# Patient Record
Sex: Female | Born: 1940
Health system: Southern US, Community
[De-identification: ages and names within clinical notes are randomized; demographics above are authoritative.]

## PROBLEM LIST (undated history)

## (undated) DIAGNOSIS — E039 Hypothyroidism, unspecified: Secondary | ICD-10-CM

## (undated) DIAGNOSIS — I4891 Unspecified atrial fibrillation: Secondary | ICD-10-CM

## (undated) DIAGNOSIS — E079 Disorder of thyroid, unspecified: Secondary | ICD-10-CM

## (undated) DIAGNOSIS — M1990 Unspecified osteoarthritis, unspecified site: Secondary | ICD-10-CM

## (undated) DIAGNOSIS — E785 Hyperlipidemia, unspecified: Secondary | ICD-10-CM

## (undated) DIAGNOSIS — I739 Peripheral vascular disease, unspecified: Secondary | ICD-10-CM

## (undated) DIAGNOSIS — H8109 Meniere's disease, unspecified ear: Secondary | ICD-10-CM

## (undated) HISTORY — DX: Peripheral vascular disease, unspecified: I73.9

## (undated) HISTORY — PX: ABDOMINAL HYSTERECTOMY: SHX81

---

## 2006-11-05 ENCOUNTER — Encounter: Admission: RE | Admit: 2006-11-05 | Discharge: 2006-11-05 | Payer: Self-pay | Admitting: General Surgery

## 2011-10-02 ENCOUNTER — Encounter (INDEPENDENT_AMBULATORY_CARE_PROVIDER_SITE_OTHER): Payer: Self-pay | Admitting: General Surgery

## 2013-11-09 DIAGNOSIS — M1990 Unspecified osteoarthritis, unspecified site: Secondary | ICD-10-CM | POA: Insufficient documentation

## 2013-11-09 DIAGNOSIS — E039 Hypothyroidism, unspecified: Secondary | ICD-10-CM | POA: Insufficient documentation

## 2013-11-09 DIAGNOSIS — E785 Hyperlipidemia, unspecified: Secondary | ICD-10-CM | POA: Insufficient documentation

## 2014-02-12 ENCOUNTER — Other Ambulatory Visit: Payer: Self-pay | Admitting: Gastroenterology

## 2014-02-12 DIAGNOSIS — K838 Other specified diseases of biliary tract: Secondary | ICD-10-CM

## 2014-02-19 ENCOUNTER — Ambulatory Visit
Admission: RE | Admit: 2014-02-19 | Discharge: 2014-02-19 | Disposition: A | Discharge status: Home / Self Care | Payer: Medicare Other | Source: Ambulatory Visit | Attending: Gastroenterology | Admitting: Gastroenterology

## 2014-02-19 DIAGNOSIS — K838 Other specified diseases of biliary tract: Secondary | ICD-10-CM

## 2014-02-19 MED ORDER — GADOBENATE DIMEGLUMINE 529 MG/ML IV SOLN
15.0000 mL | Freq: Once | INTRAVENOUS | Status: AC | PRN
  Administered 2014-02-19: 15 mL via INTRAVENOUS

## 2014-02-26 ENCOUNTER — Other Ambulatory Visit (HOSPITAL_COMMUNITY): Payer: Self-pay | Admitting: Gastroenterology

## 2014-02-26 DIAGNOSIS — R112 Nausea with vomiting, unspecified: Secondary | ICD-10-CM

## 2014-03-08 ENCOUNTER — Encounter (HOSPITAL_COMMUNITY)
Admission: RE | Admit: 2014-03-08 | Discharge: 2014-03-08 | Disposition: A | Discharge status: Home / Self Care | Payer: Medicare Other | Source: Ambulatory Visit | Attending: Gastroenterology | Admitting: Gastroenterology

## 2014-03-08 DIAGNOSIS — R112 Nausea with vomiting, unspecified: Secondary | ICD-10-CM | POA: Insufficient documentation

## 2014-03-08 MED ORDER — SINCALIDE 5 MCG IJ SOLR
0.0200 ug/kg | Freq: Once | INTRAMUSCULAR | Status: AC
  Administered 2014-03-08: 1.47 ug via INTRAVENOUS

## 2014-03-08 MED ORDER — TECHNETIUM TC 99M MEBROFENIN IV KIT
5.0000 | PACK | Freq: Once | INTRAVENOUS | Status: AC | PRN
  Administered 2014-03-08: 5 via INTRAVENOUS

## 2014-03-08 MED ORDER — SINCALIDE 5 MCG IJ SOLR
INTRAMUSCULAR | Status: AC
  Administered 2014-03-08: 1.47 ug via INTRAVENOUS
  Filled 2014-03-08: qty 5

## 2014-09-23 HISTORY — PX: SPHINCTEROTOMY: SHX5279

## 2014-10-01 ENCOUNTER — Emergency Department (HOSPITAL_COMMUNITY): Payer: Medicare Other

## 2014-10-01 ENCOUNTER — Inpatient Hospital Stay (HOSPITAL_COMMUNITY)
Admission: EM | Admit: 2014-10-01 | Discharge: 2014-10-05 | DRG: 445 | Disposition: A | Discharge status: Home / Self Care | Payer: Medicare Other | Attending: Internal Medicine | Admitting: Internal Medicine

## 2014-10-01 ENCOUNTER — Telehealth (HOSPITAL_BASED_OUTPATIENT_CLINIC_OR_DEPARTMENT_OTHER): Payer: Self-pay | Admitting: Emergency Medicine

## 2014-10-01 ENCOUNTER — Encounter (HOSPITAL_COMMUNITY): Payer: Self-pay | Admitting: Emergency Medicine

## 2014-10-01 DIAGNOSIS — E039 Hypothyroidism, unspecified: Secondary | ICD-10-CM | POA: Diagnosis present

## 2014-10-01 DIAGNOSIS — K830 Cholangitis: Secondary | ICD-10-CM | POA: Diagnosis present

## 2014-10-01 DIAGNOSIS — K8030 Calculus of bile duct with cholangitis, unspecified, without obstruction: Principal | ICD-10-CM | POA: Diagnosis present

## 2014-10-01 DIAGNOSIS — E785 Hyperlipidemia, unspecified: Secondary | ICD-10-CM | POA: Diagnosis present

## 2014-10-01 DIAGNOSIS — R531 Weakness: Secondary | ICD-10-CM | POA: Diagnosis present

## 2014-10-01 DIAGNOSIS — H8109 Meniere's disease, unspecified ear: Secondary | ICD-10-CM | POA: Diagnosis present

## 2014-10-01 DIAGNOSIS — K8309 Other cholangitis: Secondary | ICD-10-CM

## 2014-10-01 DIAGNOSIS — E876 Hypokalemia: Secondary | ICD-10-CM | POA: Diagnosis present

## 2014-10-01 DIAGNOSIS — K8032 Calculus of bile duct with acute cholangitis without obstruction: Secondary | ICD-10-CM | POA: Diagnosis present

## 2014-10-01 DIAGNOSIS — D649 Anemia, unspecified: Secondary | ICD-10-CM | POA: Diagnosis present

## 2014-10-01 DIAGNOSIS — T501X5A Adverse effect of loop [high-ceiling] diuretics, initial encounter: Secondary | ICD-10-CM | POA: Diagnosis present

## 2014-10-01 DIAGNOSIS — K8020 Calculus of gallbladder without cholecystitis without obstruction: Secondary | ICD-10-CM

## 2014-10-01 DIAGNOSIS — K8050 Calculus of bile duct without cholangitis or cholecystitis without obstruction: Secondary | ICD-10-CM | POA: Diagnosis present

## 2014-10-01 DIAGNOSIS — R710 Precipitous drop in hematocrit: Secondary | ICD-10-CM | POA: Diagnosis present

## 2014-10-01 HISTORY — DX: Disorder of thyroid, unspecified: E07.9

## 2014-10-01 LAB — URINALYSIS, ROUTINE W REFLEX MICROSCOPIC
BILIRUBIN URINE: NEGATIVE
GLUCOSE, UA: NEGATIVE mg/dL
Ketones, ur: NEGATIVE mg/dL
Nitrite: NEGATIVE
Protein, ur: NEGATIVE mg/dL
Specific Gravity, Urine: 1.013 (ref 1.005–1.030)
UROBILINOGEN UA: 2 mg/dL — AB (ref 0.0–1.0)
pH: 6.5 (ref 5.0–8.0)

## 2014-10-01 LAB — CBC WITH DIFFERENTIAL/PLATELET
Basophils Absolute: 0 10*3/uL (ref 0.0–0.1)
Basophils Relative: 0 % (ref 0–1)
EOS ABS: 0 10*3/uL (ref 0.0–0.7)
Eosinophils Relative: 0 % (ref 0–5)
HEMATOCRIT: 39.1 % (ref 36.0–46.0)
Hemoglobin: 13.6 g/dL (ref 12.0–15.0)
LYMPHS ABS: 0.6 10*3/uL — AB (ref 0.7–4.0)
LYMPHS PCT: 4 % — AB (ref 12–46)
MCH: 30.5 pg (ref 26.0–34.0)
MCHC: 34.8 g/dL (ref 30.0–36.0)
MCV: 87.7 fL (ref 78.0–100.0)
MONO ABS: 0.6 10*3/uL (ref 0.1–1.0)
MONOS PCT: 4 % (ref 3–12)
NEUTROS ABS: 13.1 10*3/uL — AB (ref 1.7–7.7)
Neutrophils Relative %: 92 % — ABNORMAL HIGH (ref 43–77)
Platelets: 172 10*3/uL (ref 150–400)
RBC: 4.46 MIL/uL (ref 3.87–5.11)
RDW: 13 % (ref 11.5–15.5)
WBC: 14.3 10*3/uL — ABNORMAL HIGH (ref 4.0–10.5)

## 2014-10-01 LAB — URINE MICROSCOPIC-ADD ON

## 2014-10-01 LAB — COMPREHENSIVE METABOLIC PANEL
ALK PHOS: 301 U/L — AB (ref 39–117)
ALT: 277 U/L — AB (ref 0–35)
AST: 220 U/L — AB (ref 0–37)
Albumin: 3.3 g/dL — ABNORMAL LOW (ref 3.5–5.2)
Anion gap: 15 (ref 5–15)
BUN: 11 mg/dL (ref 6–23)
CO2: 25 meq/L (ref 19–32)
CREATININE: 0.73 mg/dL (ref 0.50–1.10)
Calcium: 8.7 mg/dL (ref 8.4–10.5)
Chloride: 92 mEq/L — ABNORMAL LOW (ref 96–112)
GFR calc Af Amer: >90 mL/min (ref >90)
GFR, EST NON AFRICAN AMERICAN: 82 mL/min — AB (ref >90)
GLUCOSE: 127 mg/dL — AB (ref 70–99)
Potassium: 3.3 mEq/L — ABNORMAL LOW (ref 3.7–5.3)
Sodium: 132 mEq/L — ABNORMAL LOW (ref 137–147)
Total Bilirubin: 1.8 mg/dL — ABNORMAL HIGH (ref 0.3–1.2)
Total Protein: 7.1 g/dL (ref 6.0–8.3)

## 2014-10-01 LAB — PROTIME-INR
INR: 1.07 (ref 0.00–1.49)
Prothrombin Time: 13.9 seconds (ref 11.6–15.2)

## 2014-10-01 LAB — LIPASE, BLOOD: LIPASE: 20 U/L (ref 11–59)

## 2014-10-01 MED ORDER — HEPARIN SODIUM (PORCINE) 5000 UNIT/ML IJ SOLN
5000.0000 [IU] | Freq: Three times a day (TID) | INTRAMUSCULAR | Status: DC
  Administered 2014-10-01 – 2014-10-05 (×10): 5000 [IU] via SUBCUTANEOUS
  Filled 2014-10-01 (×12): qty 1

## 2014-10-01 MED ORDER — IOHEXOL 300 MG/ML  SOLN
80.0000 mL | Freq: Once | INTRAMUSCULAR | Status: AC | PRN
  Administered 2014-10-01: 80 mL via INTRAVENOUS

## 2014-10-01 MED ORDER — SODIUM CHLORIDE 0.9 % IV SOLN
INTRAVENOUS | Status: DC
  Administered 2014-10-01 – 2014-10-05 (×8): via INTRAVENOUS

## 2014-10-01 MED ORDER — ACETAMINOPHEN 325 MG PO TABS: 650.0000 mg | ORAL_TABLET | Freq: Four times a day (QID) | ORAL | Status: DC | PRN

## 2014-10-01 MED ORDER — GUAIFENESIN-DM 100-10 MG/5ML PO SYRP: 5.0000 mL | ORAL_SOLUTION | ORAL | Status: DC | PRN

## 2014-10-01 MED ORDER — MORPHINE SULFATE 2 MG/ML IJ SOLN: 1.0000 mg | INTRAMUSCULAR | Status: DC | PRN

## 2014-10-01 MED ORDER — PIPERACILLIN-TAZOBACTAM 3.375 G IVPB 30 MIN
3.3750 g | Freq: Once | INTRAVENOUS | Status: AC
  Administered 2014-10-01: 3.375 g via INTRAVENOUS
  Filled 2014-10-01: qty 50

## 2014-10-01 MED ORDER — ALBUTEROL SULFATE (2.5 MG/3ML) 0.083% IN NEBU: 2.5000 mg | INHALATION_SOLUTION | RESPIRATORY_TRACT | Status: DC | PRN

## 2014-10-01 MED ORDER — ACETAMINOPHEN 650 MG RE SUPP: 650.0000 mg | Freq: Four times a day (QID) | RECTAL | Status: DC | PRN

## 2014-10-01 MED ORDER — PIPERACILLIN-TAZOBACTAM 3.375 G IVPB
3.3750 g | Freq: Three times a day (TID) | INTRAVENOUS | Status: DC
  Administered 2014-10-01 – 2014-10-05 (×12): 3.375 g via INTRAVENOUS
  Filled 2014-10-01 (×12): qty 50

## 2014-10-01 MED ORDER — ONDANSETRON HCL 4 MG/2ML IJ SOLN: 4.0000 mg | Freq: Four times a day (QID) | INTRAMUSCULAR | Status: DC | PRN

## 2014-10-01 MED ORDER — IOHEXOL 300 MG/ML  SOLN
25.0000 mL | Freq: Once | INTRAMUSCULAR | Status: AC | PRN
  Administered 2014-10-01: 25 mL via ORAL

## 2014-10-01 MED ORDER — LEVOTHYROXINE SODIUM 88 MCG PO TABS
88.0000 ug | ORAL_TABLET | Freq: Every day | ORAL | Status: DC
  Administered 2014-10-02 – 2014-10-05 (×3): 88 ug via ORAL
  Filled 2014-10-01 (×6): qty 1

## 2014-10-01 MED ORDER — TRIAMTERENE-HCTZ 75-50 MG PO TABS
1.0000 | ORAL_TABLET | Freq: Every day | ORAL | Status: DC
  Administered 2014-10-01 – 2014-10-05 (×5): 1 via ORAL
  Filled 2014-10-01 (×7): qty 1

## 2014-10-01 MED ORDER — ONDANSETRON HCL 4 MG PO TABS: 4.0000 mg | ORAL_TABLET | Freq: Four times a day (QID) | ORAL | Status: DC | PRN

## 2014-10-01 MED ORDER — OXYCODONE HCL 5 MG PO TABS: 5.0000 mg | ORAL_TABLET | ORAL | Status: DC | PRN

## 2014-10-01 NOTE — ED Notes (Signed)
CT informed pt has finished contrast 

## 2014-10-01 NOTE — Consult Note (Signed)
Reason for Consult:primary hepatic stones Referring Physician: Dr. Abbie Sons Loney is an 73 y.o. female.  HPI:  The patient is a 73 year old white female who presents with several day history of shaking chills. She denies any abdominal pain. She denies any nausea or vomiting. As part of her workup she underwent a CT scan which shows primary hepatic duct stones but no stones within the gallbladder.  Past Medical History  Diagnosis Date  . Thyroid disease     History reviewed. No pertinent past surgical history.  History reviewed. No pertinent family history.  Social History:  reports that she does not drink alcohol or use illicit drugs. Her tobacco history is not on file.  Allergies: No Known Allergies  Medications: I have reviewed the patient's current medications.  Results for orders placed during the hospital encounter of 10/01/14 (from the past 48 hour(s))  COMPREHENSIVE METABOLIC PANEL     Status: Abnormal   Collection Time    10/01/14 12:33 PM      Result Value Ref Range   Sodium 132 (*) 137 - 147 mEq/L   Potassium 3.3 (*) 3.7 - 5.3 mEq/L   Chloride 92 (*) 96 - 112 mEq/L   CO2 25  19 - 32 mEq/L   Glucose, Bld 127 (*) 70 - 99 mg/dL   BUN 11  6 - 23 mg/dL   Creatinine, Ser 0.73  0.50 - 1.10 mg/dL   Calcium 8.7  8.4 - 10.5 mg/dL   Total Protein 7.1  6.0 - 8.3 g/dL   Albumin 3.3 (*) 3.5 - 5.2 g/dL   AST 220 (*) 0 - 37 U/L   ALT 277 (*) 0 - 35 U/L   Alkaline Phosphatase 301 (*) 39 - 117 U/L   Total Bilirubin 1.8 (*) 0.3 - 1.2 mg/dL   GFR calc non Af Amer 82 (*) >90 mL/min   GFR calc Af Amer >90  >90 mL/min   Comment: (NOTE)     The eGFR has been calculated using the CKD EPI equation.     This calculation has not been validated in all clinical situations.     eGFR's persistently <90 mL/min signify possible Chronic Kidney     Disease.   Anion gap 15  5 - 15  LIPASE, BLOOD     Status: None   Collection Time    10/01/14 12:33 PM      Result Value Ref Range    Lipase 20  11 - 59 U/L  CBC WITH DIFFERENTIAL     Status: Abnormal   Collection Time    10/01/14 12:33 PM      Result Value Ref Range   WBC 14.3 (*) 4.0 - 10.5 K/uL   RBC 4.46  3.87 - 5.11 MIL/uL   Hemoglobin 13.6  12.0 - 15.0 g/dL   HCT 39.1  36.0 - 46.0 %   MCV 87.7  78.0 - 100.0 fL   MCH 30.5  26.0 - 34.0 pg   MCHC 34.8  30.0 - 36.0 g/dL   RDW 13.0  11.5 - 15.5 %   Platelets 172  150 - 400 K/uL   Neutrophils Relative % 92 (*) 43 - 77 %   Neutro Abs 13.1 (*) 1.7 - 7.7 K/uL   Lymphocytes Relative 4 (*) 12 - 46 %   Lymphs Abs 0.6 (*) 0.7 - 4.0 K/uL   Monocytes Relative 4  3 - 12 %   Monocytes Absolute 0.6  0.1 - 1.0 K/uL  Eosinophils Relative 0  0 - 5 %   Eosinophils Absolute 0.0  0.0 - 0.7 K/uL   Basophils Relative 0  0 - 1 %   Basophils Absolute 0.0  0.0 - 0.1 K/uL  URINALYSIS, ROUTINE W REFLEX MICROSCOPIC     Status: Abnormal   Collection Time    10/01/14  2:00 PM      Result Value Ref Range   Color, Urine YELLOW  YELLOW   APPearance CLEAR  CLEAR   Specific Gravity, Urine 1.013  1.005 - 1.030   pH 6.5  5.0 - 8.0   Glucose, UA NEGATIVE  NEGATIVE mg/dL   Hgb urine dipstick MODERATE (*) NEGATIVE   Bilirubin Urine NEGATIVE  NEGATIVE   Ketones, ur NEGATIVE  NEGATIVE mg/dL   Protein, ur NEGATIVE  NEGATIVE mg/dL   Urobilinogen, UA 2.0 (*) 0.0 - 1.0 mg/dL   Nitrite NEGATIVE  NEGATIVE   Leukocytes, UA SMALL (*) NEGATIVE  URINE MICROSCOPIC-ADD ON     Status: Abnormal   Collection Time    10/01/14  2:00 PM      Result Value Ref Range   Squamous Epithelial / LPF FEW (*) RARE   WBC, UA 7-10  <3 WBC/hpf   RBC / HPF 7-10  <3 RBC/hpf   Bacteria, UA FEW (*) RARE    Dg Chest 2 View  10/01/2014   CLINICAL DATA:  Fever with chills for 1 day  EXAM: CHEST  2 VIEW  COMPARISON:  None.  FINDINGS: Lungs are clear. Heart size and pulmonary vascularity are normal. No adenopathy. No bone lesions.  IMPRESSION: No edema or consolidation.   Electronically Signed   By: Lowella Grip M.D.    On: 10/01/2014 13:40   Ct Abdomen Pelvis W Contrast  10/01/2014   CLINICAL DATA:  Malaise and fatigue this week, intermittent fever and chills, transaminitis, known gallbladder disease  EXAM: CT ABDOMEN AND PELVIS WITH CONTRAST  TECHNIQUE: Multidetector CT imaging of the abdomen and pelvis was performed using the standard protocol following bolus administration of intravenous contrast. Sagittal and coronal MPR images reconstructed from axial data set.  CONTRAST:  54m OMNIPAQUE IOHEXOL 300 MG/ML SOLN IV. Dilute oral contrast.  COMPARISON:  MRCP 02/19/2014  FINDINGS: Lung bases clear.  Minimal central intrahepatic biliary dilatation.  Dilated extrahepatic biliary tree with common hepatic duct 13 mm and CBD 11 mm.  Multiple large filling defects are seen within the extrahepatic biliary tree compatible with choledocholithiasis, including a 10 x 16 mm distal CBD stone, 13 x 14 mm common hepatic duct stone, and a 12 x 11 mm stone near the cystic duct confluence.  Remainder of liver, spleen, pancreas, kidneys, and adrenal glands normal.  Diverticulosis of the descending and sigmoid colon without diverticulitis changes.  Appendix not definitely localize, no pericecal inflammatory process seen.  Stomach and small bowel loops unremarkable.  Normal appearing bladder and ureters.  Circumaortic LEFT renal vein.  Minimal atherosclerotic calcification aorta.  No mass, adenopathy, free fluid, or free air.  Scattered calcified mesenteric lymph nodes.  Degenerative changes of RIGHT hip joint.  Osseous demineralization with degenerative disc and facet disease changes of the thoracolumbar spine.  IMPRESSION: Extrahepatic biliary dilatation with choledocholithiasis, with 3 large stones identified within the extrahepatic bile ducts as above.  Distal colonic diverticulosis without evidence of diverticulitis.  Numerous calcified mesenteric lymph nodes compatible with old granulomatous disease.   Electronically Signed   By: MLavonia Dana M.D.   On: 10/01/2014 16:01  Review of Systems  Constitutional: Positive for chills.  HENT: Negative.   Eyes: Negative.   Respiratory: Negative.   Cardiovascular: Negative.   Gastrointestinal: Negative.   Genitourinary: Negative.   Musculoskeletal: Negative.   Skin: Negative.   Neurological: Negative.   Endo/Heme/Allergies: Negative.   Psychiatric/Behavioral: Negative.    Blood pressure 115/59, pulse 78, temperature 98.9 F (37.2 C), temperature source Oral, resp. rate 18, height 5' 5"  (1.651 m), weight 161 lb (73.029 kg), SpO2 100.00%. Physical Exam  Constitutional: She is oriented to person, place, and time. She appears well-developed and well-nourished.  HENT:  Head: Normocephalic and atraumatic.  Eyes: Conjunctivae and EOM are normal. Pupils are equal, round, and reactive to light.  Neck: Normal range of motion. Neck supple.  Cardiovascular: Normal rate, regular rhythm and normal heart sounds.   Respiratory: Effort normal and breath sounds normal.  GI: Soft. Bowel sounds are normal. There is no tenderness.  Musculoskeletal: Normal range of motion.  Neurological: She is alert and oriented to person, place, and time.  Skin: Skin is warm and dry.  Psychiatric: She has a normal mood and affect. Her behavior is normal.    Assessment/Plan: The patient Appears to have primary common duct stones. At this point I think she should be admitted to a medical service. She will need a gastroenterology consult. She will probably need ERCP and possible stent placement. It is not clear whether removing her gallbladder at this time will help her condition if her stones are primarily been made in her liver and hepatic ducts. We will follow along with you.  TOTH III,Sansa Alkema S 10/01/2014, 4:47 PM

## 2014-10-01 NOTE — ED Provider Notes (Signed)
CSN: [id number]     Arrival date & time 10/01/14  1130 History   First MD Initiated Contact with Patient 10/01/14 1142     Chief Complaint  Patient presents with  . Fever     (Consider location/radiation/quality/duration/timing/severity/associated sxs/prior Treatment) Patient is a 73 y.o. female presenting with general illness.  Illness Location:  Generalized Quality:  Weakness, fatigue Severity:  Moderate Onset quality:  Gradual Duration:  2 weeks Timing:  Constant Progression:  Worsening Chronicity:  Recurrent Context:  Prior concern for biliary pathology due to elevated bilirubin, negative wu Relieved by:  Nothing Worsened by:  Nothing Associated symptoms: no cough, no diarrhea, no fever (but has had significant chills), no nausea, no shortness of breath and no vomiting     Past Medical History  Diagnosis Date  . Thyroid disease    History reviewed. No pertinent past surgical history. History reviewed. No pertinent family history. History  Substance Use Topics  . Smoking status: Not on file  . Smokeless tobacco: Not on file  . Alcohol Use: No   OB History   Grav Para Term Preterm Abortions TAB SAB Ect Mult Living                 Review of Systems  Constitutional: Negative for fever (but has had significant chills).  Respiratory: Negative for cough and shortness of breath.   Gastrointestinal: Negative for nausea, vomiting and diarrhea.  All other systems reviewed and are negative.     Allergies  Review of patient's allergies indicates no known allergies.  Home Medications   Prior to Admission medications   Medication Sig Start Date End Date Taking? Authorizing Provider  atorvastatin (LIPITOR) 10 MG tablet Take 10 mg by mouth daily.   Yes Historical Provider, MD  fexofenadine (ALLEGRA) 60 MG tablet Take 60 mg by mouth 2 (two) times daily.   Yes Historical Provider, MD  levothyroxine (SYNTHROID, LEVOTHROID) 88 MCG tablet Take 88 mcg by mouth daily before  breakfast.   Yes Historical Provider, MD  triamterene-hydrochlorothiazide (MAXZIDE) 75-50 MG per tablet Take 1 tablet by mouth daily.   Yes Historical Provider, MD   BP 115/59  Pulse 78  Temp(Src) 98.9 F (37.2 C) (Oral)  Resp 18  Ht 5' 5" (1.651 m)  Wt 161 lb (73.029 kg)  BMI 26.79 kg/m2  SpO2 100% Physical Exam  Vitals reviewed. Constitutional: She is oriented to person, place, and time. She appears well-developed and well-nourished.  HENT:  Head: Normocephalic and atraumatic.  Right Ear: External ear normal.  Left Ear: External ear normal.  Eyes: Conjunctivae and EOM are normal. Pupils are equal, round, and reactive to light.  Neck: Normal range of motion. Neck supple.  Cardiovascular: Normal rate, regular rhythm, normal heart sounds and intact distal pulses.   Pulmonary/Chest: Effort normal and breath sounds normal.  Abdominal: Soft. Bowel sounds are normal. There is no tenderness.  Musculoskeletal: Normal range of motion.  Neurological: She is alert and oriented to person, place, and time.  Skin: Skin is warm and dry.    ED Course  Procedures (including critical care time) Labs Review Labs Reviewed  COMPREHENSIVE METABOLIC PANEL - Abnormal; Notable for the following:    Sodium 132 (*)    Potassium 3.3 (*)    Chloride 92 (*)    Glucose, Bld 127 (*)    Albumin 3.3 (*)    AST 220 (*)    ALT 277 (*)    Alkaline Phosphatase 301 (*)    Total  Bilirubin 1.8 (*)    GFR calc non Af Amer 82 (*)    All other components within normal limits  CBC WITH DIFFERENTIAL - Abnormal; Notable for the following:    WBC 14.3 (*)    Neutrophils Relative % 92 (*)    Neutro Abs 13.1 (*)    Lymphocytes Relative 4 (*)    Lymphs Abs 0.6 (*)    All other components within normal limits  URINALYSIS, ROUTINE W REFLEX MICROSCOPIC - Abnormal; Notable for the following:    Hgb urine dipstick MODERATE (*)    Urobilinogen, UA 2.0 (*)    Leukocytes, UA SMALL (*)    All other components within  normal limits  URINE MICROSCOPIC-ADD ON - Abnormal; Notable for the following:    Squamous Epithelial / LPF FEW (*)    Bacteria, UA FEW (*)    All other components within normal limits  CULTURE, BLOOD (ROUTINE X 2)  CULTURE, BLOOD (ROUTINE X 2)  LIPASE, BLOOD  PROTIME-INR    Imaging Review Dg Chest 2 View  10/01/2014   CLINICAL DATA:  Fever with chills for 1 day  EXAM: CHEST  2 VIEW  COMPARISON:  None.  FINDINGS: Lungs are clear. Heart size and pulmonary vascularity are normal. No adenopathy. No bone lesions.  IMPRESSION: No edema or consolidation.   Electronically Signed   By: Lowella Grip M.D.   On: 10/01/2014 13:40   Ct Abdomen Pelvis W Contrast  10/01/2014   CLINICAL DATA:  Malaise and fatigue this week, intermittent fever and chills, transaminitis, known gallbladder disease  EXAM: CT ABDOMEN AND PELVIS WITH CONTRAST  TECHNIQUE: Multidetector CT imaging of the abdomen and pelvis was performed using the standard protocol following bolus administration of intravenous contrast. Sagittal and coronal MPR images reconstructed from axial data set.  CONTRAST:  39m OMNIPAQUE IOHEXOL 300 MG/ML SOLN IV. Dilute oral contrast.  COMPARISON:  MRCP 02/19/2014  FINDINGS: Lung bases clear.  Minimal central intrahepatic biliary dilatation.  Dilated extrahepatic biliary tree with common hepatic duct 13 mm and CBD 11 mm.  Multiple large filling defects are seen within the extrahepatic biliary tree compatible with choledocholithiasis, including a 10 x 16 mm distal CBD stone, 13 x 14 mm common hepatic duct stone, and a 12 x 11 mm stone near the cystic duct confluence.  Remainder of liver, spleen, pancreas, kidneys, and adrenal glands normal.  Diverticulosis of the descending and sigmoid colon without diverticulitis changes.  Appendix not definitely localize, no pericecal inflammatory process seen.  Stomach and small bowel loops unremarkable.  Normal appearing bladder and ureters.  Circumaortic LEFT renal vein.   Minimal atherosclerotic calcification aorta.  No mass, adenopathy, free fluid, or free air.  Scattered calcified mesenteric lymph nodes.  Degenerative changes of RIGHT hip joint.  Osseous demineralization with degenerative disc and facet disease changes of the thoracolumbar spine.  IMPRESSION: Extrahepatic biliary dilatation with choledocholithiasis, with 3 large stones identified within the extrahepatic bile ducts as above.  Distal colonic diverticulosis without evidence of diverticulitis.  Numerous calcified mesenteric lymph nodes compatible with old granulomatous disease.   Electronically Signed   By: MLavonia DanaM.D.   On: 10/01/2014 16:01     EKG Interpretation   Date/Time:  Friday October 01 2014 13:10:56 EDT Ventricular Rate:  84 PR Interval:  163 QRS Duration: 89 QT Interval:  393 QTC Calculation: 465 R Axis:   27 Text Interpretation:  Sinus rhythm Low voltage, precordial leads RSR' in  V1 or V2, right VCD  or RVH Borderline T abnormalities, anterior leads  Baseline wander in lead(s) III V3 V5 V6 No old tracing to compare  Confirmed by Debby Freiberg [phone]) on 10/01/2014 1:43:32 PM      MDM   Final diagnoses:  Choledocholithiasis    73 y.o. female with pertinent PMH of recent elevated bilirubin with unremarkable MRCP presents with generalized weakness, fatigue, and nighttime chills over last 2 weeks, acutely worsening over last 3 days.  Pt with persistent generalized weakness, but otherwise without symptoms at time of my exam.  Vitals as above.    Labs as above with transaminitis, elevated alk phos, and elevated bili.  CT scan with choledocholithiasis.  Consulted hospitalist for admission.  Attempted to call GI, however received no response.    1. Choledocholithiasis         Debby Freiberg, MD 10/01/14 [phone]

## 2014-10-01 NOTE — H&P (Addendum)
PATIENT DETAILS Name: Paige Levy Age: 73 y.o. Sex: female Date of Birth: [birth year] Admit Date: 10/01/2014 SNK:[id number],BHAL C, MD   CHIEF COMPLAINT:  Chills, epigastric discomfort  HPI: Paige Levy is a 73 y.o. female with a Past Medical History of dyslipidemia, Mnire's disease, hypothyroidism who presents today with the above noted complaint. For the past 2 days, patient has had chills with sweats, and associated epigastric discomfort. She describes the epigastric pain has "mostly discomfort"-not associated with nausea, vomiting or diarrhea. There is no radiation of her discomfort. Because of persistent nature of the symptoms, she was brought to the hospital, where a CT scan of the abdomen shows choledocholithiasis. She was also found to have significantly elevated LFTs, along with leukocytosis. I was asked to admit this patient for further evaluation and treatment.  Patient denies any history of headache, chest pain, shortness of breath.   ALLERGIES:  No Known Allergies  PAST MEDICAL HISTORY: Past Medical History  Diagnosis Date  . Thyroid disease     PAST SURGICAL HISTORY: History reviewed. No pertinent past surgical history.  MEDICATIONS AT HOME: Prior to Admission medications   Medication Sig Start Date End Date Taking? Authorizing Provider  atorvastatin (LIPITOR) 10 MG tablet Take 10 mg by mouth daily.   Yes Historical Provider, MD  fexofenadine (ALLEGRA) 60 MG tablet Take 60 mg by mouth 2 (two) times daily.   Yes Historical Provider, MD  levothyroxine (SYNTHROID, LEVOTHROID) 88 MCG tablet Take 88 mcg by mouth daily before breakfast.   Yes Historical Provider, MD  triamterene-hydrochlorothiazide (MAXZIDE) 75-50 MG per tablet Take 1 tablet by mouth daily.   Yes Historical Provider, MD    FAMILY HISTORY: Father lung cancer Mother-stomach cancer Sister-uterine cancer  SOCIAL HISTORY:  reports that she does not drink alcohol or use illicit drugs. Her  tobacco history is not on file.  REVIEW OF SYSTEMS:  Constitutional:   No  weight loss, night sweats,  Fevers, chills, fatigue.  HEENT:    No headaches, Difficulty swallowing,Tooth/dental problems,Sore throat,  No sneezing, itching, ear ache, nasal congestion, post nasal drip,   Cardio-vascular: No chest pain,  Orthopnea, PND, swelling in lower extremities, anasarca,  dizziness, palpitations  GI:  No heartburn, indigestion, abdominal pain, nausea, vomiting, diarrhea, change in       bowel habits, loss of appetite  Resp: No shortness of breath with exertion or at rest.  No excess mucus, no productive cough, No non-productive cough,  No coughing up of blood.No change in color of mucus.No wheezing.No chest wall deformity  Skin:  no rash or lesions.  GU:  no dysuria, change in color of urine, no urgency or frequency.  No flank pain.  Musculoskeletal: No joint pain or swelling.  No decreased range of motion.  No back pain.  Psych: No change in mood or affect. No depression or anxiety.  No memory loss.   PHYSICAL EXAM: Blood pressure 115/59, pulse 78, temperature 98.9 F (37.2 C), temperature source Oral, resp. rate 18, height 5\' 5"  (1.651 m), weight 73.029 kg (161 lb), SpO2 100.00%.  General appearance :Awake, alert, not in any distress. Speech Clear. Not toxic Looking HEENT: Atraumatic and Normocephalic, pupils equally reactive to light and accomodation Neck: supple, no JVD. No cervical lymphadenopathy.  Chest:Good air entry bilaterally, no added sounds  CVS: S1 S2 regular, no murmurs.  Abdomen: Bowel sounds present, Non tender and not distended with no gaurding, rigidity or rebound. Extremities: B/L Lower Ext shows no edema,  both legs are warm to touch Neurology: Awake alert, and oriented X 3, CN II-XII intact, Non focal Skin:No Rash Wounds:N/A  LABS ON ADMISSION:   Recent Labs  10/01/14 1233  NA 132*  K 3.3*  CL 92*  CO2 25  GLUCOSE 127*  BUN 11  CREATININE  0.73  CALCIUM 8.7    Recent Labs  10/01/14 1233  AST 220*  ALT 277*  ALKPHOS 301*  BILITOT 1.8*  PROT 7.1  ALBUMIN 3.3*    Recent Labs  10/01/14 1233  LIPASE 20    Recent Labs  10/01/14 1233  WBC 14.3*  NEUTROABS 13.1*  HGB 13.6  HCT 39.1  MCV 87.7  PLT 172   No results found for this basename: CKTOTAL, CKMB, CKMBINDEX, TROPONINI,  in the last 72 hours No results found for this basename: DDIMER,  in the last 72 hours No components found with this basename: POCBNP,    RADIOLOGIC STUDIES ON ADMISSION: Dg Chest 2 View  10/01/2014   CLINICAL DATA:  Fever with chills for 1 day  EXAM: CHEST  2 VIEW  COMPARISON:  None.  FINDINGS: Lungs are clear. Heart size and pulmonary vascularity are normal. No adenopathy. No bone lesions.  IMPRESSION: No edema or consolidation.   Electronically Signed   By: Lowella Grip M.D.   On: 10/01/2014 13:40   Ct Abdomen Pelvis W Contrast  10/01/2014   CLINICAL DATA:  Malaise and fatigue this week, intermittent fever and chills, transaminitis, known gallbladder disease  EXAM: CT ABDOMEN AND PELVIS WITH CONTRAST  TECHNIQUE: Multidetector CT imaging of the abdomen and pelvis was performed using the standard protocol following bolus administration of intravenous contrast. Sagittal and coronal MPR images reconstructed from axial data set.  CONTRAST:  78mL OMNIPAQUE IOHEXOL 300 MG/ML SOLN IV. Dilute oral contrast.  COMPARISON:  MRCP 02/19/2014  FINDINGS: Lung bases clear.  Minimal central intrahepatic biliary dilatation.  Dilated extrahepatic biliary tree with common hepatic duct 13 mm and CBD 11 mm.  Multiple large filling defects are seen within the extrahepatic biliary tree compatible with choledocholithiasis, including a 10 x 16 mm distal CBD stone, 13 x 14 mm common hepatic duct stone, and a 12 x 11 mm stone near the cystic duct confluence.  Remainder of liver, spleen, pancreas, kidneys, and adrenal glands normal.  Diverticulosis of the descending and  sigmoid colon without diverticulitis changes.  Appendix not definitely localize, no pericecal inflammatory process seen.  Stomach and small bowel loops unremarkable.  Normal appearing bladder and ureters.  Circumaortic LEFT renal vein.  Minimal atherosclerotic calcification aorta.  No mass, adenopathy, free fluid, or free air.  Scattered calcified mesenteric lymph nodes.  Degenerative changes of RIGHT hip joint.  Osseous demineralization with degenerative disc and facet disease changes of the thoracolumbar spine.  IMPRESSION: Extrahepatic biliary dilatation with choledocholithiasis, with 3 large stones identified within the extrahepatic bile ducts as above.  Distal colonic diverticulosis without evidence of diverticulitis.  Numerous calcified mesenteric lymph nodes compatible with old granulomatous disease.   Electronically Signed   By: Lavonia Dana M.D.   On: 10/01/2014 16:01     EKG: Independently reviewed. NSR  ASSESSMENT AND PLAN: Present on Admission:  . Choledocholithiasis with low grade Cholangitis - Patient presents with subjective fever with chills, significant elevated LFTs along with CT abdomen findings of choledocholithiasis. Admit to a medical surgical unit, start empiric IV Zosyn. Will need ERCP, [facility] consulted-awaiting call back. She will likely need a cholecystectomy at some point-Gen. surgery  has already been consulted by the emergency department physician. Patient clinically looks nontoxic, with stable hemodynamics. We'll monitor closely.   Addendum 5:55 pm- spoke with Dr. Amedeo Plenty, will evaluate tomorrow.  . Dyslipidemia - Hold statins for now- once LFTs normalizes resume.  . Mnire's disease - Continue with current diuretic regimen. Appears stable. Has a history of chronic tinnitus.   Further plan will depend as patient's clinical course evolves and further radiologic and laboratory data become available. Patient will be monitored closely.   Above noted plan  was discussed with patient/husband, they were in agreement.   DVT Prophylaxis: Prophylactic Heparin  Code Status: Full Code  Total time spent for admission equals 45 minutes.  [facility] Pager [phone]  If 7PM-7AM, please contact night-coverage [email or web address] Password [email or web address] 10/01/2014, 5:40 PM

## 2014-10-01 NOTE — ED Notes (Signed)
Pt transported to CT ?

## 2014-10-01 NOTE — ED Notes (Signed)
Pt reports hx of gallbladder disease but did not get it removed. This week is having fatigue and intermittent fever/chills. No acute dsitress noted at triage.

## 2014-10-01 NOTE — Progress Notes (Signed)
ANTIBIOTIC CONSULT NOTE - INITIAL  Pharmacy Consult for zosyn Indication: Intra-abdominal infection  No Known Allergies  Patient Measurements: Height: 5\' 5"  (165.1 cm) Weight: 161 lb (73.029 kg) IBW/kg (Calculated) : 57 Adjusted Body Weight:   Vital Signs: Temp: 98.9 F (37.2 C) (10/09 1338) Temp Source: Oral (10/09 1338) BP: 122/64 mmHg (10/09 1645) Pulse Rate: 82 (10/09 1645) Intake/Output from previous day:   Intake/Output from this shift:    Labs:  Recent Labs  10/01/14 1233  WBC 14.3*  HGB 13.6  PLT 172  CREATININE 0.73   Estimated Creatinine Clearance: 60.8 ml/min (by C-G formula based on Cr of 0.73). No results found for this basename: VANCOTROUGH, VANCOPEAK, VANCORANDOM, GENTTROUGH, GENTPEAK, GENTRANDOM, TOBRATROUGH, TOBRAPEAK, TOBRARND, AMIKACINPEAK, AMIKACINTROU, AMIKACIN,  in the last 72 hours   Microbiology: No results found for this or any previous visit (from the past 720 hour(s)).  Medical History: Past Medical History  Diagnosis Date  . Thyroid disease     Medications:  Scheduled:  . heparin  5,000 Units Subcutaneous 3 times per day  . [START ON 10/02/2014] levothyroxine  88 mcg Oral QAC breakfast  . triamterene-hydrochlorothiazide  1 tablet Oral Daily   Infusions:  . sodium chloride     Assessment: 73 yo who was admitted for epigastric pain. CT showed choledocholithiasis with cholangitis. Epiric zosyn has been ordered to r/o sepsis.   Plan:   Zosyn 3.375g IV q8 Adjust dose if needed  Onnie Boer, PharmD Pager: [phone] 10/01/2014 6:51 PM

## 2014-10-01 NOTE — ED Notes (Signed)
Dr. Marlou Starks in to assess pt at this time

## 2014-10-02 DIAGNOSIS — E876 Hypokalemia: Secondary | ICD-10-CM

## 2014-10-02 LAB — COMPREHENSIVE METABOLIC PANEL
ALBUMIN: 3.1 g/dL — AB (ref 3.5–5.2)
ALK PHOS: 255 U/L — AB (ref 39–117)
ALT: 202 U/L — ABNORMAL HIGH (ref 0–35)
ANION GAP: 16 — AB (ref 5–15)
AST: 120 U/L — ABNORMAL HIGH (ref 0–37)
BUN: 9 mg/dL (ref 6–23)
CALCIUM: 8.8 mg/dL (ref 8.4–10.5)
CO2: 26 mEq/L (ref 19–32)
CREATININE: 0.76 mg/dL (ref 0.50–1.10)
Chloride: 95 mEq/L — ABNORMAL LOW (ref 96–112)
GFR calc non Af Amer: 80 mL/min — ABNORMAL LOW (ref >90)
GLUCOSE: 93 mg/dL (ref 70–99)
Potassium: 3 mEq/L — ABNORMAL LOW (ref 3.7–5.3)
Sodium: 137 mEq/L (ref 137–147)
TOTAL PROTEIN: 6.7 g/dL (ref 6.0–8.3)
Total Bilirubin: 1.7 mg/dL — ABNORMAL HIGH (ref 0.3–1.2)

## 2014-10-02 LAB — CBC
HEMATOCRIT: 37.7 % (ref 36.0–46.0)
HEMOGLOBIN: 13.2 g/dL (ref 12.0–15.0)
MCH: 30.8 pg (ref 26.0–34.0)
MCHC: 35 g/dL (ref 30.0–36.0)
MCV: 87.9 fL (ref 78.0–100.0)
Platelets: 168 10*3/uL (ref 150–400)
RBC: 4.29 MIL/uL (ref 3.87–5.11)
RDW: 13.3 % (ref 11.5–15.5)
WBC: 6.7 10*3/uL (ref 4.0–10.5)

## 2014-10-02 MED ORDER — POTASSIUM CHLORIDE CRYS ER 20 MEQ PO TBCR
40.0000 meq | EXTENDED_RELEASE_TABLET | Freq: Once | ORAL | Status: AC
  Administered 2014-10-02: 40 meq via ORAL
  Filled 2014-10-02: qty 2

## 2014-10-02 NOTE — Progress Notes (Signed)
[facility]                                                                              Patient Demographics  Paige Levy, is a 73 y.o. female, DOB - May 15, 1941, VQQ:[id number]  Admit date - 10/01/2014   Admitting Physician Evalee Mutton Kristeen Mans, MD  Outpatient Primary MD for the patient is Missy Sabins, MD  LOS - 1   Chief Complaint  Patient presents with  . Fever      HPI on 10/01/2014 Paige Levy is a 73 y.o. female with a Past Medical History of dyslipidemia, Mnire's disease, hypothyroidism who presented with chills and epigastric discomfort for the past 2 days.  She described the epigastric pain as "mostly discomfort"-not associated with nausea, vomiting or diarrhea. There was no radiation of her discomfort. Because of persistent nature of the symptoms, she was brought to the hospital, where a CT scan of the abdomen shows choledocholithiasis. She was also found to have significantly elevated LFTs, along with leukocytosis. [facility] was asked to admit this patient for further evaluation and treatment.  Patient denied any history of headache, chest pain, shortness of breath.   Assessment & Plan   Epigastric pain likely secondary to choledocholithiasis with low-grade cholangitis -Patient did have elevated LFTs, which are improving -CT scan of the abdomen did show findings of choledocholithiasis -Continue Zosyn -General surgery as well as gastroenterology were consulted and appreciated -Patient will likely need cholecystectomy however will likely need ERCP -Pending GI evaluation  Dyslipidemia -Statins currently held due to to elevated LFTs  Chronic Mnire's disease -Continue diuretics -Appears stable  Hypokalemia -Likely secondary to diuretic -Will replete and continue to monitor BMP  Leukocytosis -Likely secondary to the above -Resolved, will continue to monitor CBC  Hypothyroidism -Continue Synthroid  Code Status: Full  Family Communication: Husband at  bedside.  Disposition Plan: Admitted  Time Spent in minutes   30 minutes  Procedures  None  Consults   General surgery Gastroenterology  DVT Prophylaxis  heparin  Lab Results  Component Value Date   PLT 168 10/02/2014    Medications  Scheduled Meds: . heparin  5,000 Units Subcutaneous 3 times per day  . levothyroxine  88 mcg Oral QAC breakfast  . piperacillin-tazobactam (ZOSYN)  IV  3.375 g Intravenous Q8H  . triamterene-hydrochlorothiazide  1 tablet Oral Daily   Continuous Infusions: . sodium chloride 125 mL/hr at 10/02/14 0526   PRN Meds:.acetaminophen, acetaminophen, albuterol, guaiFENesin-dextromethorphan, morphine injection, ondansetron (ZOFRAN) IV, ondansetron, oxyCODONE  Antibiotics    Anti-infectives   Start     Dose/Rate Route Frequency Ordered Stop   10/02/14 0000  piperacillin-tazobactam (ZOSYN) IVPB 3.375 g     3.375 g 12.5 mL/hr over 240 Minutes Intravenous Every 8 hours 10/01/14 1851     10/01/14 1645  piperacillin-tazobactam (ZOSYN) IVPB 3.375 g     3.375 g 100 mL/hr over 30 Minutes Intravenous  Once 10/01/14 1642 10/01/14 1759        Subjective:   Paige Levy seen and examined today.  Patient states her abdominal discomfort has improved. She does state that when she doesn't eat, it does return. He denies any current chest pain, shortness of breath, abdominal  pain, nausea, vomiting.  Objective:   Filed Vitals:   10/01/14 1600 10/01/14 1645 10/01/14 2204 10/02/14 0531  BP: 117/58 122/64 128/57 90/55  Pulse: 80 82 84 72  Temp:   97.7 F (36.5 C) 98.1 F (36.7 C)  TempSrc:   Oral Oral  Resp:   18 18  Height:      Weight:      SpO2: 95% 98% 100% 98%    Wt Readings from Last 3 Encounters:  10/01/14 73.029 kg (161 lb)     Intake/Output Summary (Last 24 hours) at 10/02/14 1039 Last data filed at 10/02/14 0523  Gross per 24 hour  Intake   1526 ml  Output      0 ml  Net   1526 ml    Exam  General: Well developed, well  nourished, NAD, appears stated age  HEENT: NCAT, PERRLA, EOMI, Anicteic Sclera, mucous membranes moist.   Neck: Supple, no JVD, no masses  Cardiovascular: S1 S2 auscultated, no rubs, murmurs or gallops. Regular rate and rhythm.  Respiratory: Clear to auscultation bilaterally with equal chest rise  Abdomen: Soft, nontender, nondistended, + bowel sounds  Extremities: warm dry without cyanosis clubbing or edema  Neuro: AAOx3, no focal  Skin: Without rashes exudates or nodules  Psych: Normal affect and demeanor with intact judgement and insight  Data Review   Micro Results No results found for this or any previous visit (from the past 240 hour(s)).  Radiology Reports Dg Chest 2 View  10/01/2014   CLINICAL DATA:  Fever with chills for 1 day  EXAM: CHEST  2 VIEW  COMPARISON:  None.  FINDINGS: Lungs are clear. Heart size and pulmonary vascularity are normal. No adenopathy. No bone lesions.  IMPRESSION: No edema or consolidation.   Electronically Signed   By: Lowella Grip M.D.   On: 10/01/2014 13:40   Ct Abdomen Pelvis W Contrast  10/01/2014   CLINICAL DATA:  Malaise and fatigue this week, intermittent fever and chills, transaminitis, known gallbladder disease  EXAM: CT ABDOMEN AND PELVIS WITH CONTRAST  TECHNIQUE: Multidetector CT imaging of the abdomen and pelvis was performed using the standard protocol following bolus administration of intravenous contrast. Sagittal and coronal MPR images reconstructed from axial data set.  CONTRAST:  60mL OMNIPAQUE IOHEXOL 300 MG/ML SOLN IV. Dilute oral contrast.  COMPARISON:  MRCP 02/19/2014  FINDINGS: Lung bases clear.  Minimal central intrahepatic biliary dilatation.  Dilated extrahepatic biliary tree with common hepatic duct 13 mm and CBD 11 mm.  Multiple large filling defects are seen within the extrahepatic biliary tree compatible with choledocholithiasis, including a 10 x 16 mm distal CBD stone, 13 x 14 mm common hepatic duct stone, and a 12 x  11 mm stone near the cystic duct confluence.  Remainder of liver, spleen, pancreas, kidneys, and adrenal glands normal.  Diverticulosis of the descending and sigmoid colon without diverticulitis changes.  Appendix not definitely localize, no pericecal inflammatory process seen.  Stomach and small bowel loops unremarkable.  Normal appearing bladder and ureters.  Circumaortic LEFT renal vein.  Minimal atherosclerotic calcification aorta.  No mass, adenopathy, free fluid, or free air.  Scattered calcified mesenteric lymph nodes.  Degenerative changes of RIGHT hip joint.  Osseous demineralization with degenerative disc and facet disease changes of the thoracolumbar spine.  IMPRESSION: Extrahepatic biliary dilatation with choledocholithiasis, with 3 large stones identified within the extrahepatic bile ducts as above.  Distal colonic diverticulosis without evidence of diverticulitis.  Numerous calcified mesenteric lymph nodes compatible  with old granulomatous disease.   Electronically Signed   By: Lavonia Dana M.D.   On: 10/01/2014 16:01    CBC  Recent Labs Lab 10/01/14 1233 10/02/14 0312  WBC 14.3* 6.7  HGB 13.6 13.2  HCT 39.1 37.7  PLT 172 168  MCV 87.7 87.9  MCH 30.5 30.8  MCHC 34.8 35.0  RDW 13.0 13.3  LYMPHSABS 0.6*  --   MONOABS 0.6  --   EOSABS 0.0  --   BASOSABS 0.0  --     Chemistries   Recent Labs Lab 10/01/14 1233 10/02/14 0312  NA 132* 137  K 3.3* 3.0*  CL 92* 95*  CO2 25 26  GLUCOSE 127* 93  BUN 11 9  CREATININE 0.73 0.76  CALCIUM 8.7 8.8  AST 220* 120*  ALT 277* 202*  ALKPHOS 301* 255*  BILITOT 1.8* 1.7*   ------------------------------------------------------------------------------------------------------------------ estimated creatinine clearance is 60.8 ml/min (by C-G formula based on Cr of 0.76). ------------------------------------------------------------------------------------------------------------------ No results found for this basename: HGBA1C,  in the  last 72 hours ------------------------------------------------------------------------------------------------------------------ No results found for this basename: CHOL, HDL, LDLCALC, TRIG, CHOLHDL, LDLDIRECT,  in the last 72 hours ------------------------------------------------------------------------------------------------------------------ No results found for this basename: TSH, T4TOTAL, FREET3, T3FREE, THYROIDAB,  in the last 72 hours ------------------------------------------------------------------------------------------------------------------ No results found for this basename: VITAMINB12, FOLATE, FERRITIN, TIBC, IRON, RETICCTPCT,  in the last 72 hours  Coagulation profile  Recent Labs Lab 10/01/14 1643  INR 1.07    No results found for this basename: DDIMER,  in the last 72 hours  Cardiac Enzymes No results found for this basename: CK, CKMB, TROPONINI, MYOGLOBIN,  in the last 168 hours ------------------------------------------------------------------------------------------------------------------ No components found with this basename: POCBNP,     Jacksen Isip D.O. on 10/02/2014 at 10:39 AM  Between 7am to 7pm - Pager - [phone]  After 7pm go to [email or web address] - password [email or web address]  And look for the night coverage person covering for me after hours  [facility] Office  [phone]

## 2014-10-02 NOTE — Progress Notes (Signed)
Patient ID: Paige Levy, female   DOB: 05-26-1941, 73 y.o.   MRN: [id number] [facility] Progress Note:   * No surgery found *  Subjective: Mental status is clear and she is feeling much better Objective: Vital signs in last 24 hours: Temp:  [97.7 F (36.5 C)-99 F (37.2 C)] 98.1 F (36.7 C) (10/10 0531) Pulse Rate:  [72-92] 72 (10/10 0531) Resp:  [18] 18 (10/10 0531) BP: (90-128)/(55-74) 90/55 mmHg (10/10 0531) SpO2:  [95 %-100 %] 98 % (10/10 0531) Weight:  [161 lb (73.029 kg)] 161 lb (73.029 kg) (10/09 1136)  Intake/Output from previous day: 10/09 0701 - 10/10 0700 In: 5465 [P.O.:240; I.V.:1236; IV Piggyback:50] Out: -  Intake/Output this shift:    Physical Exam: Work of breathing is not labored.  Non-tender abdomen  Lab Results:  Results for orders placed during the hospital encounter of 10/01/14 (from the past 48 hour(s))  COMPREHENSIVE METABOLIC PANEL     Status: Abnormal   Collection Time    10/01/14 12:33 PM      Result Value Ref Range   Sodium 132 (*) 137 - 147 mEq/L   Potassium 3.3 (*) 3.7 - 5.3 mEq/L   Chloride 92 (*) 96 - 112 mEq/L   CO2 25  19 - 32 mEq/L   Glucose, Bld 127 (*) 70 - 99 mg/dL   BUN 11  6 - 23 mg/dL   Creatinine, Ser 0.73  0.50 - 1.10 mg/dL   Calcium 8.7  8.4 - 10.5 mg/dL   Total Protein 7.1  6.0 - 8.3 g/dL   Albumin 3.3 (*) 3.5 - 5.2 g/dL   AST 220 (*) 0 - 37 U/L   ALT 277 (*) 0 - 35 U/L   Alkaline Phosphatase 301 (*) 39 - 117 U/L   Total Bilirubin 1.8 (*) 0.3 - 1.2 mg/dL   GFR calc non Af Amer 82 (*) >90 mL/min   GFR calc Af Amer >90  >90 mL/min   Comment: (NOTE)     The eGFR has been calculated using the CKD EPI equation.     This calculation has not been validated in all clinical situations.     eGFR's persistently <90 mL/min signify possible Chronic Kidney     Disease.   Anion gap 15  5 - 15  LIPASE, BLOOD     Status: None   Collection Time    10/01/14 12:33 PM      Result Value Ref Range   Lipase 20  11 - 59 U/L   CBC WITH DIFFERENTIAL     Status: Abnormal   Collection Time    10/01/14 12:33 PM      Result Value Ref Range   WBC 14.3 (*) 4.0 - 10.5 K/uL   RBC 4.46  3.87 - 5.11 MIL/uL   Hemoglobin 13.6  12.0 - 15.0 g/dL   HCT 39.1  36.0 - 46.0 %   MCV 87.7  78.0 - 100.0 fL   MCH 30.5  26.0 - 34.0 pg   MCHC 34.8  30.0 - 36.0 g/dL   RDW 13.0  11.5 - 15.5 %   Platelets 172  150 - 400 K/uL   Neutrophils Relative % 92 (*) 43 - 77 %   Neutro Abs 13.1 (*) 1.7 - 7.7 K/uL   Lymphocytes Relative 4 (*) 12 - 46 %   Lymphs Abs 0.6 (*) 0.7 - 4.0 K/uL   Monocytes Relative 4  3 - 12 %   Monocytes Absolute 0.6  0.1 - 1.0 K/uL   Eosinophils Relative 0  0 - 5 %   Eosinophils Absolute 0.0  0.0 - 0.7 K/uL   Basophils Relative 0  0 - 1 %   Basophils Absolute 0.0  0.0 - 0.1 K/uL  URINALYSIS, ROUTINE W REFLEX MICROSCOPIC     Status: Abnormal   Collection Time    10/01/14  2:00 PM      Result Value Ref Range   Color, Urine YELLOW  YELLOW   APPearance CLEAR  CLEAR   Specific Gravity, Urine 1.013  1.005 - 1.030   pH 6.5  5.0 - 8.0   Glucose, UA NEGATIVE  NEGATIVE mg/dL   Hgb urine dipstick MODERATE (*) NEGATIVE   Bilirubin Urine NEGATIVE  NEGATIVE   Ketones, ur NEGATIVE  NEGATIVE mg/dL   Protein, ur NEGATIVE  NEGATIVE mg/dL   Urobilinogen, UA 2.0 (*) 0.0 - 1.0 mg/dL   Nitrite NEGATIVE  NEGATIVE   Leukocytes, UA SMALL (*) NEGATIVE  URINE MICROSCOPIC-ADD ON     Status: Abnormal   Collection Time    10/01/14  2:00 PM      Result Value Ref Range   Squamous Epithelial / LPF FEW (*) RARE   WBC, UA 7-10  <3 WBC/hpf   RBC / HPF 7-10  <3 RBC/hpf   Bacteria, UA FEW (*) RARE  PROTIME-INR     Status: None   Collection Time    10/01/14  4:43 PM      Result Value Ref Range   Prothrombin Time 13.9  11.6 - 15.2 seconds   INR 1.07  0.00 - 1.49  CBC     Status: None   Collection Time    10/02/14  3:12 AM      Result Value Ref Range   WBC 6.7  4.0 - 10.5 K/uL   RBC 4.29  3.87 - 5.11 MIL/uL   Hemoglobin 13.2  12.0  - 15.0 g/dL   HCT 37.7  36.0 - 46.0 %   MCV 87.9  78.0 - 100.0 fL   MCH 30.8  26.0 - 34.0 pg   MCHC 35.0  30.0 - 36.0 g/dL   RDW 13.3  11.5 - 15.5 %   Platelets 168  150 - 400 K/uL  COMPREHENSIVE METABOLIC PANEL     Status: Abnormal   Collection Time    10/02/14  3:12 AM      Result Value Ref Range   Sodium 137  137 - 147 mEq/L   Potassium 3.0 (*) 3.7 - 5.3 mEq/L   Chloride 95 (*) 96 - 112 mEq/L   CO2 26  19 - 32 mEq/L   Glucose, Bld 93  70 - 99 mg/dL   BUN 9  6 - 23 mg/dL   Creatinine, Ser 0.76  0.50 - 1.10 mg/dL   Calcium 8.8  8.4 - 10.5 mg/dL   Total Protein 6.7  6.0 - 8.3 g/dL   Albumin 3.1 (*) 3.5 - 5.2 g/dL   AST 120 (*) 0 - 37 U/L   ALT 202 (*) 0 - 35 U/L   Alkaline Phosphatase 255 (*) 39 - 117 U/L   Total Bilirubin 1.7 (*) 0.3 - 1.2 mg/dL   GFR calc non Af Amer 80 (*) >90 mL/min   GFR calc Af Amer >90  >90 mL/min   Comment: (NOTE)     The eGFR has been calculated using the CKD EPI equation.     This calculation has not been validated in all  clinical situations.     eGFR's persistently <90 mL/min signify possible Chronic Kidney     Disease.   Anion gap 16 (*) 5 - 15    Radiology/Results: Dg Chest 2 View  10/01/2014   CLINICAL DATA:  Fever with chills for 1 day  EXAM: CHEST  2 VIEW  COMPARISON:  None.  FINDINGS: Lungs are clear. Heart size and pulmonary vascularity are normal. No adenopathy. No bone lesions.  IMPRESSION: No edema or consolidation.   Electronically Signed   By: Lowella Grip M.D.   On: 10/01/2014 13:40   Ct Abdomen Pelvis W Contrast  10/01/2014   CLINICAL DATA:  Malaise and fatigue this week, intermittent fever and chills, transaminitis, known gallbladder disease  EXAM: CT ABDOMEN AND PELVIS WITH CONTRAST  TECHNIQUE: Multidetector CT imaging of the abdomen and pelvis was performed using the standard protocol following bolus administration of intravenous contrast. Sagittal and coronal MPR images reconstructed from axial data set.  CONTRAST:  35m  OMNIPAQUE IOHEXOL 300 MG/ML SOLN IV. Dilute oral contrast.  COMPARISON:  MRCP 02/19/2014  FINDINGS: Lung bases clear.  Minimal central intrahepatic biliary dilatation.  Dilated extrahepatic biliary tree with common hepatic duct 13 mm and CBD 11 mm.  Multiple large filling defects are seen within the extrahepatic biliary tree compatible with choledocholithiasis, including a 10 x 16 mm distal CBD stone, 13 x 14 mm common hepatic duct stone, and a 12 x 11 mm stone near the cystic duct confluence.  Remainder of liver, spleen, pancreas, kidneys, and adrenal glands normal.  Diverticulosis of the descending and sigmoid colon without diverticulitis changes.  Appendix not definitely localize, no pericecal inflammatory process seen.  Stomach and small bowel loops unremarkable.  Normal appearing bladder and ureters.  Circumaortic LEFT renal vein.  Minimal atherosclerotic calcification aorta.  No mass, adenopathy, free fluid, or free air.  Scattered calcified mesenteric lymph nodes.  Degenerative changes of RIGHT hip joint.  Osseous demineralization with degenerative disc and facet disease changes of the thoracolumbar spine.  IMPRESSION: Extrahepatic biliary dilatation with choledocholithiasis, with 3 large stones identified within the extrahepatic bile ducts as above.  Distal colonic diverticulosis without evidence of diverticulitis.  Numerous calcified mesenteric lymph nodes compatible with old granulomatous disease.   Electronically Signed   By: MLavonia DanaM.D.   On: 10/01/2014 16:01    Anti-infectives: Anti-infectives   Start     Dose/Rate Route Frequency Ordered Stop   10/02/14 0000  piperacillin-tazobactam (ZOSYN) IVPB 3.375 g     3.375 g 12.5 mL/hr over 240 Minutes Intravenous Every 8 hours 10/01/14 1851     10/01/14 1645  piperacillin-tazobactam (ZOSYN) IVPB 3.375 g     3.375 g 100 mL/hr over 30 Minutes Intravenous  Once 10/01/14 1642 10/01/14 1759      Assessment/Plan: Problem List: Patient Active  Problem List   Diagnosis Date Noted  . Choledocholithiasis 10/01/2014  . Cholangitis 10/01/2014  . Meniere's disease 10/01/2014  . Dyslipidemia 10/01/2014    According to patient she is to have ERCP either later today or probably tomorrow.  Will follow for subsequent lap chole.  * No surgery found *    LOS: 1 day   Matt B. MHassell Done MD, [facility] [phone] 10/02/2014 9:39 AM

## 2014-10-02 NOTE — Consult Note (Signed)
[facility] Consult Note  Referring Provider: No ref. provider found Primary Care Physician:  Missy Sabins, MD Primary Gastroenterologist:  Dr.  Laurel Dimmer Complaint: Chills and mild epigastric discomfort HPI: Paige Levy is an 73 y.o. white female  who presented with the above complaints over 2 days. She presented to the emergency room and had elevated LFTs and leukocytosis and CT scan showed common bile and common hepatic duct stones. She was seen by surgery and noted there no obvious gallstones. We are consulted for ERCP.  Past Medical History  Diagnosis Date  . Thyroid disease     History reviewed. No pertinent past surgical history.  Medications Prior to Admission  Medication Sig Dispense Refill  . atorvastatin (LIPITOR) 10 MG tablet Take 10 mg by mouth daily.      . fexofenadine (ALLEGRA) 60 MG tablet Take 60 mg by mouth 2 (two) times daily.      Marland Kitchen levothyroxine (SYNTHROID, LEVOTHROID) 88 MCG tablet Take 88 mcg by mouth daily before breakfast.      . triamterene-hydrochlorothiazide (MAXZIDE) 75-50 MG per tablet Take 1 tablet by mouth daily.        Allergies: No Known Allergies  History reviewed. No pertinent family history.  Social History:  reports that she does not drink alcohol or use illicit drugs. Her tobacco history is not on file.  Review of Systems: negative except as above.   Blood pressure 90/55, pulse 72, temperature 98.1 F (36.7 C), temperature source Oral, resp. rate 18, height 5' 5" (1.651 m), weight 73.029 kg (161 lb), SpO2 98.00%. Head: Normocephalic, without obvious abnormality, atraumatic Neck: no adenopathy, no carotid bruit, no JVD, supple, symmetrical, trachea midline and thyroid not enlarged, symmetric, no tenderness/mass/nodules Resp: clear to auscultation bilaterally Cardio: regular rate and rhythm, S1, S2 normal, no murmur, click, rub or gallop GI: Abdomen soft nondistended with normoactive bowel sounds. No hepatosplenomegaly mass or  guarding Extremities: extremities normal, atraumatic, no cyanosis or edema  Results for orders placed during the hospital encounter of 10/01/14 (from the past 48 hour(s))  COMPREHENSIVE METABOLIC PANEL     Status: Abnormal   Collection Time    10/01/14 12:33 PM      Result Value Ref Range   Sodium 132 (*) 137 - 147 mEq/L   Potassium 3.3 (*) 3.7 - 5.3 mEq/L   Chloride 92 (*) 96 - 112 mEq/L   CO2 25  19 - 32 mEq/L   Glucose, Bld 127 (*) 70 - 99 mg/dL   BUN 11  6 - 23 mg/dL   Creatinine, Ser 0.73  0.50 - 1.10 mg/dL   Calcium 8.7  8.4 - 10.5 mg/dL   Total Protein 7.1  6.0 - 8.3 g/dL   Albumin 3.3 (*) 3.5 - 5.2 g/dL   AST 220 (*) 0 - 37 U/L   ALT 277 (*) 0 - 35 U/L   Alkaline Phosphatase 301 (*) 39 - 117 U/L   Total Bilirubin 1.8 (*) 0.3 - 1.2 mg/dL   GFR calc non Af Amer 82 (*) >90 mL/min   GFR calc Af Amer >90  >90 mL/min   Comment: (NOTE)     The eGFR has been calculated using the CKD EPI equation.     This calculation has not been validated in all clinical situations.     eGFR's persistently <90 mL/min signify possible Chronic Kidney     Disease.   Anion gap 15  5 - 15  LIPASE, BLOOD     Status: None  Collection Time    10/01/14 12:33 PM      Result Value Ref Range   Lipase 20  11 - 59 U/L  CBC WITH DIFFERENTIAL     Status: Abnormal   Collection Time    10/01/14 12:33 PM      Result Value Ref Range   WBC 14.3 (*) 4.0 - 10.5 K/uL   RBC 4.46  3.87 - 5.11 MIL/uL   Hemoglobin 13.6  12.0 - 15.0 g/dL   HCT 39.1  36.0 - 46.0 %   MCV 87.7  78.0 - 100.0 fL   MCH 30.5  26.0 - 34.0 pg   MCHC 34.8  30.0 - 36.0 g/dL   RDW 13.0  11.5 - 15.5 %   Platelets 172  150 - 400 K/uL   Neutrophils Relative % 92 (*) 43 - 77 %   Neutro Abs 13.1 (*) 1.7 - 7.7 K/uL   Lymphocytes Relative 4 (*) 12 - 46 %   Lymphs Abs 0.6 (*) 0.7 - 4.0 K/uL   Monocytes Relative 4  3 - 12 %   Monocytes Absolute 0.6  0.1 - 1.0 K/uL   Eosinophils Relative 0  0 - 5 %   Eosinophils Absolute 0.0  0.0 - 0.7 K/uL    Basophils Relative 0  0 - 1 %   Basophils Absolute 0.0  0.0 - 0.1 K/uL  URINALYSIS, ROUTINE W REFLEX MICROSCOPIC     Status: Abnormal   Collection Time    10/01/14  2:00 PM      Result Value Ref Range   Color, Urine YELLOW  YELLOW   APPearance CLEAR  CLEAR   Specific Gravity, Urine 1.013  1.005 - 1.030   pH 6.5  5.0 - 8.0   Glucose, UA NEGATIVE  NEGATIVE mg/dL   Hgb urine dipstick MODERATE (*) NEGATIVE   Bilirubin Urine NEGATIVE  NEGATIVE   Ketones, ur NEGATIVE  NEGATIVE mg/dL   Protein, ur NEGATIVE  NEGATIVE mg/dL   Urobilinogen, UA 2.0 (*) 0.0 - 1.0 mg/dL   Nitrite NEGATIVE  NEGATIVE   Leukocytes, UA SMALL (*) NEGATIVE  URINE MICROSCOPIC-ADD ON     Status: Abnormal   Collection Time    10/01/14  2:00 PM      Result Value Ref Range   Squamous Epithelial / LPF FEW (*) RARE   WBC, UA 7-10  <3 WBC/hpf   RBC / HPF 7-10  <3 RBC/hpf   Bacteria, UA FEW (*) RARE  PROTIME-INR     Status: None   Collection Time    10/01/14  4:43 PM      Result Value Ref Range   Prothrombin Time 13.9  11.6 - 15.2 seconds   INR 1.07  0.00 - 1.49  CBC     Status: None   Collection Time    10/02/14  3:12 AM      Result Value Ref Range   WBC 6.7  4.0 - 10.5 K/uL   RBC 4.29  3.87 - 5.11 MIL/uL   Hemoglobin 13.2  12.0 - 15.0 g/dL   HCT 37.7  36.0 - 46.0 %   MCV 87.9  78.0 - 100.0 fL   MCH 30.8  26.0 - 34.0 pg   MCHC 35.0  30.0 - 36.0 g/dL   RDW 13.3  11.5 - 15.5 %   Platelets 168  150 - 400 K/uL  COMPREHENSIVE METABOLIC PANEL     Status: Abnormal   Collection Time    10/02/14    3:12 AM      Result Value Ref Range   Sodium 137  137 - 147 mEq/L   Potassium 3.0 (*) 3.7 - 5.3 mEq/L   Chloride 95 (*) 96 - 112 mEq/L   CO2 26  19 - 32 mEq/L   Glucose, Bld 93  70 - 99 mg/dL   BUN 9  6 - 23 mg/dL   Creatinine, Ser 0.76  0.50 - 1.10 mg/dL   Calcium 8.8  8.4 - 10.5 mg/dL   Total Protein 6.7  6.0 - 8.3 g/dL   Albumin 3.1 (*) 3.5 - 5.2 g/dL   AST 120 (*) 0 - 37 U/L   ALT 202 (*) 0 - 35 U/L   Alkaline  Phosphatase 255 (*) 39 - 117 U/L   Total Bilirubin 1.7 (*) 0.3 - 1.2 mg/dL   GFR calc non Af Amer 80 (*) >90 mL/min   GFR calc Af Amer >90  >90 mL/min   Comment: (NOTE)     The eGFR has been calculated using the CKD EPI equation.     This calculation has not been validated in all clinical situations.     eGFR's persistently <90 mL/min signify possible Chronic Kidney     Disease.   Anion gap 16 (*) 5 - 15   Dg Chest 2 View  10/01/2014   CLINICAL DATA:  Fever with chills for 1 day  EXAM: CHEST  2 VIEW  COMPARISON:  None.  FINDINGS: Lungs are clear. Heart size and pulmonary vascularity are normal. No adenopathy. No bone lesions.  IMPRESSION: No edema or consolidation.   Electronically Signed   By: William  Woodruff M.D.   On: 10/01/2014 13:40   Ct Abdomen Pelvis W Contrast  10/01/2014   CLINICAL DATA:  Malaise and fatigue this week, intermittent fever and chills, transaminitis, known gallbladder disease  EXAM: CT ABDOMEN AND PELVIS WITH CONTRAST  TECHNIQUE: Multidetector CT imaging of the abdomen and pelvis was performed using the standard protocol following bolus administration of intravenous contrast. Sagittal and coronal MPR images reconstructed from axial data set.  CONTRAST:  80mL OMNIPAQUE IOHEXOL 300 MG/ML SOLN IV. Dilute oral contrast.  COMPARISON:  MRCP 02/19/2014  FINDINGS: Lung bases clear.  Minimal central intrahepatic biliary dilatation.  Dilated extrahepatic biliary tree with common hepatic duct 13 mm and CBD 11 mm.  Multiple large filling defects are seen within the extrahepatic biliary tree compatible with choledocholithiasis, including a 10 x 16 mm distal CBD stone, 13 x 14 mm common hepatic duct stone, and a 12 x 11 mm stone near the cystic duct confluence.  Remainder of liver, spleen, pancreas, kidneys, and adrenal glands normal.  Diverticulosis of the descending and sigmoid colon without diverticulitis changes.  Appendix not definitely localize, no pericecal inflammatory process seen.   Stomach and small bowel loops unremarkable.  Normal appearing bladder and ureters.  Circumaortic LEFT renal vein.  Minimal atherosclerotic calcification aorta.  No mass, adenopathy, free fluid, or free air.  Scattered calcified mesenteric lymph nodes.  Degenerative changes of RIGHT hip joint.  Osseous demineralization with degenerative disc and facet disease changes of the thoracolumbar spine.  IMPRESSION: Extrahepatic biliary dilatation with choledocholithiasis, with 3 large stones identified within the extrahepatic bile ducts as above.  Distal colonic diverticulosis without evidence of diverticulitis.  Numerous calcified mesenteric lymph nodes compatible with old granulomatous disease.   Electronically Signed   By: Mark  Boles M.D.   On: 10/01/2014 16:01    Assessment: 1. Choledocholithiasis possible mild cholangitis   Plan:  1. Agree with Zosyn 3.375 mg every 6 hours 2. Will proceed with ERCP today or tomorrow. Risks rationale and alternatives were explained to the patient she wishes to proceed. This has been schedduled for 10 AM tomorrow HAYES,JOHN C 10/02/2014, 7:30 AM    

## 2014-10-03 ENCOUNTER — Encounter (HOSPITAL_COMMUNITY): Payer: Self-pay | Admitting: Anesthesiology

## 2014-10-03 ENCOUNTER — Inpatient Hospital Stay (HOSPITAL_COMMUNITY): Payer: Medicare Other | Admitting: Anesthesiology

## 2014-10-03 ENCOUNTER — Encounter (HOSPITAL_COMMUNITY)
Admission: EM | Disposition: A | Discharge status: Home / Self Care | Payer: Self-pay | Source: Home / Self Care | Attending: Internal Medicine

## 2014-10-03 ENCOUNTER — Inpatient Hospital Stay (HOSPITAL_COMMUNITY): Payer: Medicare Other

## 2014-10-03 ENCOUNTER — Encounter (HOSPITAL_COMMUNITY): Payer: Medicare Other | Admitting: Anesthesiology

## 2014-10-03 HISTORY — PX: ERCP: SHX5425

## 2014-10-03 LAB — CBC
HCT: 34.6 % — ABNORMAL LOW (ref 36.0–46.0)
Hemoglobin: 11.8 g/dL — ABNORMAL LOW (ref 12.0–15.0)
MCH: 30.4 pg (ref 26.0–34.0)
MCHC: 34.1 g/dL (ref 30.0–36.0)
MCV: 89.2 fL (ref 78.0–100.0)
PLATELETS: 168 10*3/uL (ref 150–400)
RBC: 3.88 MIL/uL (ref 3.87–5.11)
RDW: 13.3 % (ref 11.5–15.5)
WBC: 4.4 10*3/uL (ref 4.0–10.5)

## 2014-10-03 LAB — COMPREHENSIVE METABOLIC PANEL
ALT: 123 U/L — AB (ref 0–35)
AST: 53 U/L — ABNORMAL HIGH (ref 0–37)
Albumin: 2.8 g/dL — ABNORMAL LOW (ref 3.5–5.2)
Alkaline Phosphatase: 198 U/L — ABNORMAL HIGH (ref 39–117)
Anion gap: 11 (ref 5–15)
BUN: 7 mg/dL (ref 6–23)
CALCIUM: 8.4 mg/dL (ref 8.4–10.5)
CO2: 25 mEq/L (ref 19–32)
Chloride: 103 mEq/L (ref 96–112)
Creatinine, Ser: 0.89 mg/dL (ref 0.50–1.10)
GFR calc Af Amer: 72 mL/min — ABNORMAL LOW (ref >90)
GFR calc non Af Amer: 62 mL/min — ABNORMAL LOW (ref >90)
Glucose, Bld: 87 mg/dL (ref 70–99)
POTASSIUM: 3.7 meq/L (ref 3.7–5.3)
Sodium: 139 mEq/L (ref 137–147)
TOTAL PROTEIN: 5.9 g/dL — AB (ref 6.0–8.3)
Total Bilirubin: 1 mg/dL (ref 0.3–1.2)

## 2014-10-03 SURGERY — ERCP, WITH INTERVENTION IF INDICATED
Anesthesia: General | Laterality: Left

## 2014-10-03 MED ORDER — LACTATED RINGERS IV SOLN
INTRAVENOUS | Status: DC
  Administered 2014-10-03: 10:00:00 via INTRAVENOUS

## 2014-10-03 MED ORDER — FENTANYL CITRATE 0.05 MG/ML IJ SOLN: 25.0000 ug | INTRAMUSCULAR | Status: DC | PRN

## 2014-10-03 MED ORDER — FENTANYL CITRATE 0.05 MG/ML IJ SOLN
INTRAMUSCULAR | Status: DC | PRN
  Administered 2014-10-03: 50 ug via INTRAVENOUS

## 2014-10-03 MED ORDER — ONDANSETRON HCL 4 MG/2ML IJ SOLN
INTRAMUSCULAR | Status: DC | PRN
  Administered 2014-10-03: 4 mg via INTRAVENOUS

## 2014-10-03 MED ORDER — EPHEDRINE SULFATE 50 MG/ML IJ SOLN
INTRAMUSCULAR | Status: DC | PRN
  Administered 2014-10-03 (×2): 10 mg via INTRAVENOUS

## 2014-10-03 MED ORDER — SUCCINYLCHOLINE CHLORIDE 20 MG/ML IJ SOLN
INTRAMUSCULAR | Status: DC | PRN
  Administered 2014-10-03: 50 mg via INTRAVENOUS

## 2014-10-03 MED ORDER — LIDOCAINE HCL (CARDIAC) 20 MG/ML IV SOLN
INTRAVENOUS | Status: DC | PRN
  Administered 2014-10-03: 60 mg via INTRAVENOUS

## 2014-10-03 MED ORDER — IOHEXOL 300 MG/ML  SOLN
INTRAMUSCULAR | Status: DC | PRN
  Administered 2014-10-03: 12:00:00

## 2014-10-03 MED ORDER — SODIUM CHLORIDE 0.9 % IV SOLN: INTRAVENOUS | Status: DC

## 2014-10-03 MED ORDER — LACTATED RINGERS IV SOLN
INTRAVENOUS | Status: DC | PRN
  Administered 2014-10-03: 10:00:00 via INTRAVENOUS

## 2014-10-03 MED ORDER — DEXAMETHASONE SODIUM PHOSPHATE 10 MG/ML IJ SOLN
INTRAMUSCULAR | Status: DC | PRN
  Administered 2014-10-03: 8 mg via INTRAVENOUS

## 2014-10-03 MED ORDER — PHENYLEPHRINE HCL 10 MG/ML IJ SOLN
INTRAMUSCULAR | Status: DC | PRN
  Administered 2014-10-03: 80 ug via INTRAVENOUS

## 2014-10-03 MED ORDER — PROPOFOL 10 MG/ML IV BOLUS
INTRAVENOUS | Status: DC | PRN
  Administered 2014-10-03: 130 mg via INTRAVENOUS
  Administered 2014-10-03 (×2): 10 mg via INTRAVENOUS

## 2014-10-03 NOTE — Interval H&P Note (Signed)
History and Physical Interval Note:  10/03/2014 9:57 AM  Paige Levy  has presented today for surgery, with the diagnosis of Choledocholithiasis  The various methods of treatment have been discussed with the patient and family. After consideration of risks, benefits and other options for treatment, the patient has consented to  Procedure(s): ENDOSCOPIC RETROGRADE CHOLANGIOPANCREATOGRAPHY (ERCP) (Left) as a surgical intervention .  The patient's history has been reviewed, patient examined, no change in status, stable for surgery.  I have reviewed the patient's chart and labs.  Questions were answered to the patient's satisfaction.     Tyson Masin C

## 2014-10-03 NOTE — Progress Notes (Signed)
Patient ID: Paige Levy, female   DOB: [birth year], 72 y.o.   MRN: [id number] [facility] Surgery Progress Note:   Day of Surgery  Subjective: Mental status is not assessed.  Patient is down undergoing ERCP Objective: Vital signs in last 24 hours: Temp:  [97.7 F (36.5 C)-98.2 F (36.8 C)] 97.8 F (36.6 C) (10/11 0936) Pulse Rate:  [68-93] 73 (10/11 0936) Resp:  [12-19] 12 (10/11 0936) BP: (98-125)/(57-64) 125/64 mmHg (10/11 0936) SpO2:  [98 %-100 %] 100 % (10/11 0936)  Intake/Output from previous day: 10/10 0701 - 10/11 0700 In: 2971.1 [I.V.:2971.1] Out: -  Intake/Output this shift:    Physical Exam: Work of breathing is not accessed  Lab Results:  Results for orders placed during the hospital encounter of 10/01/14 (from the past 48 hour(s))  COMPREHENSIVE METABOLIC PANEL     Status: Abnormal   Collection Time    10/01/14 12:33 PM      Result Value Ref Range   Sodium 132 (*) 137 - 147 mEq/L   Potassium 3.3 (*) 3.7 - 5.3 mEq/L   Chloride 92 (*) 96 - 112 mEq/L   CO2 25  19 - 32 mEq/L   Glucose, Bld 127 (*) 70 - 99 mg/dL   BUN 11  6 - 23 mg/dL   Creatinine, Ser 0.73  0.50 - 1.10 mg/dL   Calcium 8.7  8.4 - 10.5 mg/dL   Total Protein 7.1  6.0 - 8.3 g/dL   Albumin 3.3 (*) 3.5 - 5.2 g/dL   AST 220 (*) 0 - 37 U/L   ALT 277 (*) 0 - 35 U/L   Alkaline Phosphatase 301 (*) 39 - 117 U/L   Total Bilirubin 1.8 (*) 0.3 - 1.2 mg/dL   GFR calc non Af Amer 82 (*) >90 mL/min   GFR calc Af Amer >90  >90 mL/min   Comment: (NOTE)     The eGFR has been calculated using the CKD EPI equation.     This calculation has not been validated in all clinical situations.     eGFR's persistently <90 mL/min signify possible Chronic Kidney     Disease.   Anion gap 15  5 - 15  LIPASE, BLOOD     Status: None   Collection Time    10/01/14 12:33 PM      Result Value Ref Range   Lipase 20  11 - 59 U/L  CBC WITH DIFFERENTIAL     Status: Abnormal   Collection Time    10/01/14 12:33 PM   Result Value Ref Range   WBC 14.3 (*) 4.0 - 10.5 K/uL   RBC 4.46  3.87 - 5.11 MIL/uL   Hemoglobin 13.6  12.0 - 15.0 g/dL   HCT 39.1  36.0 - 46.0 %   MCV 87.7  78.0 - 100.0 fL   MCH 30.5  26.0 - 34.0 pg   MCHC 34.8  30.0 - 36.0 g/dL   RDW 13.0  11.5 - 15.5 %   Platelets 172  150 - 400 K/uL   Neutrophils Relative % 92 (*) 43 - 77 %   Neutro Abs 13.1 (*) 1.7 - 7.7 K/uL   Lymphocytes Relative 4 (*) 12 - 46 %   Lymphs Abs 0.6 (*) 0.7 - 4.0 K/uL   Monocytes Relative 4  3 - 12 %   Monocytes Absolute 0.6  0.1 - 1.0 K/uL   Eosinophils Relative 0  0 - 5 %   Eosinophils Absolute 0.0  0.0 - 0.7  K/uL   Basophils Relative 0  0 - 1 %   Basophils Absolute 0.0  0.0 - 0.1 K/uL  URINALYSIS, ROUTINE W REFLEX MICROSCOPIC     Status: Abnormal   Collection Time    10/01/14  2:00 PM      Result Value Ref Range   Color, Urine YELLOW  YELLOW   APPearance CLEAR  CLEAR   Specific Gravity, Urine 1.013  1.005 - 1.030   pH 6.5  5.0 - 8.0   Glucose, UA NEGATIVE  NEGATIVE mg/dL   Hgb urine dipstick MODERATE (*) NEGATIVE   Bilirubin Urine NEGATIVE  NEGATIVE   Ketones, ur NEGATIVE  NEGATIVE mg/dL   Protein, ur NEGATIVE  NEGATIVE mg/dL   Urobilinogen, UA 2.0 (*) 0.0 - 1.0 mg/dL   Nitrite NEGATIVE  NEGATIVE   Leukocytes, UA SMALL (*) NEGATIVE  URINE MICROSCOPIC-ADD ON     Status: Abnormal   Collection Time    10/01/14  2:00 PM      Result Value Ref Range   Squamous Epithelial / LPF FEW (*) RARE   WBC, UA 7-10  <3 WBC/hpf   RBC / HPF 7-10  <3 RBC/hpf   Bacteria, UA FEW (*) RARE  PROTIME-INR     Status: None   Collection Time    10/01/14  4:43 PM      Result Value Ref Range   Prothrombin Time 13.9  11.6 - 15.2 seconds   INR 1.07  0.00 - 1.49  CULTURE, BLOOD (ROUTINE X 2)     Status: None   Collection Time    10/01/14  5:02 PM      Result Value Ref Range   Specimen Description BLOOD ARM RIGHT     Special Requests BOTTLES DRAWN AEROBIC AND ANAEROBIC 10CC     Culture  Setup Time       Value:  10/01/2014 22:03     Performed at [facility]   Culture       Value:        BLOOD CULTURE RECEIVED NO GROWTH TO DATE CULTURE WILL BE HELD FOR 5 DAYS BEFORE ISSUING A FINAL NEGATIVE REPORT     Performed at [facility]   Report Status PENDING    CULTURE, BLOOD (ROUTINE X 2)     Status: None   Collection Time    10/01/14  5:10 PM      Result Value Ref Range   Specimen Description BLOOD ARM LEFT     Special Requests BOTTLES DRAWN AEROBIC AND ANAEROBIC 10CC     Culture  Setup Time       Value: 10/01/2014 22:03     Performed at [facility]   Culture       Value:        BLOOD CULTURE RECEIVED NO GROWTH TO DATE CULTURE WILL BE HELD FOR 5 DAYS BEFORE ISSUING A FINAL NEGATIVE REPORT     Performed at [facility]   Report Status PENDING    CBC     Status: None   Collection Time    10/02/14  3:12 AM      Result Value Ref Range   WBC 6.7  4.0 - 10.5 K/uL   RBC 4.29  3.87 - 5.11 MIL/uL   Hemoglobin 13.2  12.0 - 15.0 g/dL   HCT 37.7  36.0 - 46.0 %   MCV 87.9  78.0 - 100.0 fL   MCH 30.8  26.0 - 34.0 pg  MCHC 35.0  30.0 - 36.0 g/dL   RDW 13.3  11.5 - 15.5 %   Platelets 168  150 - 400 K/uL  COMPREHENSIVE METABOLIC PANEL     Status: Abnormal   Collection Time    10/02/14  3:12 AM      Result Value Ref Range   Sodium 137  137 - 147 mEq/L   Potassium 3.0 (*) 3.7 - 5.3 mEq/L   Chloride 95 (*) 96 - 112 mEq/L   CO2 26  19 - 32 mEq/L   Glucose, Bld 93  70 - 99 mg/dL   BUN 9  6 - 23 mg/dL   Creatinine, Ser 0.76  0.50 - 1.10 mg/dL   Calcium 8.8  8.4 - 10.5 mg/dL   Total Protein 6.7  6.0 - 8.3 g/dL   Albumin 3.1 (*) 3.5 - 5.2 g/dL   AST 120 (*) 0 - 37 U/L   ALT 202 (*) 0 - 35 U/L   Alkaline Phosphatase 255 (*) 39 - 117 U/L   Total Bilirubin 1.7 (*) 0.3 - 1.2 mg/dL   GFR calc non Af Amer 80 (*) >90 mL/min   GFR calc Af Amer >90  >90 mL/min   Comment: (NOTE)     The eGFR has been calculated using the CKD EPI equation.     This calculation has not been  validated in all clinical situations.     eGFR's persistently <90 mL/min signify possible Chronic Kidney     Disease.   Anion gap 16 (*) 5 - 15  CBC     Status: Abnormal   Collection Time    10/03/14  4:38 AM      Result Value Ref Range   WBC 4.4  4.0 - 10.5 K/uL   RBC 3.88  3.87 - 5.11 MIL/uL   Hemoglobin 11.8 (*) 12.0 - 15.0 g/dL   HCT 34.6 (*) 36.0 - 46.0 %   MCV 89.2  78.0 - 100.0 fL   MCH 30.4  26.0 - 34.0 pg   MCHC 34.1  30.0 - 36.0 g/dL   RDW 13.3  11.5 - 15.5 %   Platelets 168  150 - 400 K/uL  COMPREHENSIVE METABOLIC PANEL     Status: Abnormal   Collection Time    10/03/14  4:38 AM      Result Value Ref Range   Sodium 139  137 - 147 mEq/L   Potassium 3.7  3.7 - 5.3 mEq/L   Chloride 103  96 - 112 mEq/L   CO2 25  19 - 32 mEq/L   Glucose, Bld 87  70 - 99 mg/dL   BUN 7  6 - 23 mg/dL   Creatinine, Ser 0.89  0.50 - 1.10 mg/dL   Calcium 8.4  8.4 - 10.5 mg/dL   Total Protein 5.9 (*) 6.0 - 8.3 g/dL   Albumin 2.8 (*) 3.5 - 5.2 g/dL   AST 53 (*) 0 - 37 U/L   ALT 123 (*) 0 - 35 U/L   Alkaline Phosphatase 198 (*) 39 - 117 U/L   Total Bilirubin 1.0  0.3 - 1.2 mg/dL   GFR calc non Af Amer 62 (*) >90 mL/min   GFR calc Af Amer 72 (*) >90 mL/min   Comment: (NOTE)     The eGFR has been calculated using the CKD EPI equation.     This calculation has not been validated in all clinical situations.     eGFR's persistently <90 mL/min signify possible Chronic  Kidney     Disease.   Anion gap 11  5 - 15    Radiology/Results: Dg Chest 2 View  10/01/2014   CLINICAL DATA:  Fever with chills for 1 day  EXAM: CHEST  2 VIEW  COMPARISON:  None.  FINDINGS: Lungs are clear. Heart size and pulmonary vascularity are normal. No adenopathy. No bone lesions.  IMPRESSION: No edema or consolidation.   Electronically Signed   By: Lowella Grip M.D.   On: 10/01/2014 13:40   Ct Abdomen Pelvis W Contrast  10/01/2014   CLINICAL DATA:  Malaise and fatigue this week, intermittent fever and chills,  transaminitis, known gallbladder disease  EXAM: CT ABDOMEN AND PELVIS WITH CONTRAST  TECHNIQUE: Multidetector CT imaging of the abdomen and pelvis was performed using the standard protocol following bolus administration of intravenous contrast. Sagittal and coronal MPR images reconstructed from axial data set.  CONTRAST:  66m OMNIPAQUE IOHEXOL 300 MG/ML SOLN IV. Dilute oral contrast.  COMPARISON:  MRCP 02/19/2014  FINDINGS: Lung bases clear.  Minimal central intrahepatic biliary dilatation.  Dilated extrahepatic biliary tree with common hepatic duct 13 mm and CBD 11 mm.  Multiple large filling defects are seen within the extrahepatic biliary tree compatible with choledocholithiasis, including a 10 x 16 mm distal CBD stone, 13 x 14 mm common hepatic duct stone, and a 12 x 11 mm stone near the cystic duct confluence.  Remainder of liver, spleen, pancreas, kidneys, and adrenal glands normal.  Diverticulosis of the descending and sigmoid colon without diverticulitis changes.  Appendix not definitely localize, no pericecal inflammatory process seen.  Stomach and small bowel loops unremarkable.  Normal appearing bladder and ureters.  Circumaortic LEFT renal vein.  Minimal atherosclerotic calcification aorta.  No mass, adenopathy, free fluid, or free air.  Scattered calcified mesenteric lymph nodes.  Degenerative changes of RIGHT hip joint.  Osseous demineralization with degenerative disc and facet disease changes of the thoracolumbar spine.  IMPRESSION: Extrahepatic biliary dilatation with choledocholithiasis, with 3 large stones identified within the extrahepatic bile ducts as above.  Distal colonic diverticulosis without evidence of diverticulitis.  Numerous calcified mesenteric lymph nodes compatible with old granulomatous disease.   Electronically Signed   By: MLavonia DanaM.D.   On: 10/01/2014 16:01    Anti-infectives: Anti-infectives   Start     Dose/Rate Route Frequency Ordered Stop   10/02/14 0000   piperacillin-tazobactam (ZOSYN) IVPB 3.375 g     3.375 g 12.5 mL/hr over 240 Minutes Intravenous Every 8 hours 10/01/14 1851     10/01/14 1645  piperacillin-tazobactam (ZOSYN) IVPB 3.375 g     3.375 g 100 mL/hr over 30 Minutes Intravenous  Once 10/01/14 1642 10/02/14 2120      Assessment/Plan: Problem List: Patient Active Problem List   Diagnosis Date Noted  . Choledocholithiasis 10/01/2014  . Cholangitis 10/01/2014  . Meniere's disease 10/01/2014  . Dyslipidemia 10/01/2014    Await results of ERCP before making a disposition regarding surgery. Day of Surgery    LOS: 2 days   Matt B. MHassell Done MD, FNorth River [facility] LLCSur[facility], [facility] [phone] 10/03/2014 10:36 AM

## 2014-10-03 NOTE — Anesthesia Procedure Notes (Signed)
Procedure Name: Intubation Date/Time: 10/03/2014 10:01 AM Performed by: Neldon Newport Pre-anesthesia Checklist: Patient identified, Emergency Drugs available, Suction available, Patient being monitored and Timeout performed Patient Re-evaluated:Patient Re-evaluated prior to inductionOxygen Delivery Method: Circle system utilized Preoxygenation: Pre-oxygenation with 100% oxygen Intubation Type: IV induction Ventilation: Mask ventilation without difficulty Laryngoscope Size: Mac and 3 Grade View: Grade I Tube type: Oral Tube size: 7.0 mm Number of attempts: 1 Placement Confirmation: breath sounds checked- equal and bilateral,  positive ETCO2 and ETT inserted through vocal cords under direct vision Secured at: 22 cm Tube secured with: Tape Dental Injury: Teeth and Oropharynx as per pre-operative assessment

## 2014-10-03 NOTE — Anesthesia Postprocedure Evaluation (Signed)
  Anesthesia Post-op Note  Patient: Paige Levy  Procedure(s) Performed: Procedure(s): ENDOSCOPIC RETROGRADE CHOLANGIOPANCREATOGRAPHY (ERCP) (Left)  Patient Location: PACU  Anesthesia Type:General  Level of Consciousness: awake, alert  and oriented  Airway and Oxygen Therapy: Patient Spontanous Breathing and Patient connected to nasal cannula oxygen  Post-op Pain: none  Post-op Assessment: Post-op Vital signs reviewed, Patient's Cardiovascular Status Stable, Respiratory Function Stable, Patent Airway, No signs of Nausea or vomiting, Adequate PO intake and Pain level controlled  Post-op Vital Signs: Reviewed and stable  Last Vitals:  Filed Vitals:   10/03/14 0936  BP: 125/64  Pulse: 73  Temp: 36.6 C  Resp: 12    Complications: No apparent anesthesia complications

## 2014-10-03 NOTE — Op Note (Signed)
[facility] [place], [id number]   ERCP PROCEDURE REPORT        EXAM DATE: 10/03/2014  PATIENT NAME:          Paige Levy, Paige Levy          MR #: [id number] BIRTHDATE:       25-May-1941     VISIT #:     [phone] ATTENDING:     Teena Irani, MD     STATUS:     inpatient ASSISTANT:      Sharon Mt and Elna Breslow  INDICATIONS:  The patient is a 73 yr old female here for an ERCP due to PROCEDURE PERFORMED:     ERCP with sphincterotomy and stone fragmentation and stent placement MEDICATIONS:     MAC  CONSENT: The patient understands the risks and benefits of the procedure and understands that these risks include, but are not limited to: sedation, allergic reaction, infection, perforation and/or bleeding. Alternative means of evaluation and treatment include, among others: physical exam, x-rays, and/or surgical intervention. The patient elects to proceed with this endoscopic procedure.  DESCRIPTION OF PROCEDURE: During intra-op preparation period all mechanical & medical equipment was checked for proper function. Hand hygiene and appropriate measures for infection prevention was taken. After the risks, benefits and alternatives of the procedure were thoroughly explained, Informed was verified, confirmed and timeout was successfully executed by the treatment team. With the patient in left semi-prone position, medications were administered intravenously.The XT-[id number] ([id number]) was passed from the mouth into the esophagus and further advanced from the esophagus into the stomach. From stomach scope was directed to the papilla of Vater     .  Major papilla was aligned with the duodenoscope. The scope position was confirmed fluoroscopically. Rest of the findings/therapeutics are given below. The scope was then completely withdrawn from the patient and the procedure completed. The pulse, BP, and O2 saturation were monitored and  documented by the physician and the nursing staff throughout the entire procedure. The patient was cared for as planned according to standard protocol. The patient was then discharged to recovery in stable condition and with appropriate post procedure care.  1 guidewire entry into the pancreatic duct was made and selective cannulation of the common bile duct achieved.      the papilla was small and there was a periampullary diverticulum or 3 cm proximal to it. A cholangiogram was obtained and showed at least 3 or 4 stones that were in the vicinity of 10-15 mm in diameter. I made a moderate size sphincterotomy but due to the small intraduodenal portion of the papilla this was limited. Was able to trap the distalmost stone but was not able to pull it through the sphincterotomy papilla. I was resident to apply too much force due to the adjacent diverticulum. A 2 and then 3 cm basket catheter was advanced into the duct and positioned around to the 2distal most stones and partially fragmented.the most proximal and largest stone could not be crushed or fragmented in the basket catheter. I reinserted a 12-15 balloon catheter to try to clear some of the fragments but no stones wouldn't come through with the balloon inflated to 12 mm and then partially deflated. Again I was afraid of applying too much forced to the sphincterotomy size papilla due to the adjacent diverticulum. I elected to place a 5 cm 10 French stent and no significant stone fragments were removed. There was good drainage of bile at the  end of the procedure.    ADVERSE EVENT:     no immediate complications IMPRESSIONS:     very large common bile duct stones with fairly small papilla and periampullary diverticulum, unable to retrieve stones or large fragments after attempted basket  fragmentation   RECOMMENDATIONS:     continue stent and observe for complications. Consider ursodeoxycholic acid and a later attempt at repeat    ERCP with possible enlargement of sphincterotomy and possibly use of the spine glass lithotriptor. REPEAT EXAM:   ___________________________________ Teena Irani, MD eSigned:  Teena Irani, MD 10-31-14 11:24 AM   cc:  CPT CODES: ICD9 CODES:  The ICD and CPT codes recommended by this software are interpretations from the data that the clinical staff has captured with the software.  The verification of the translation of this report to the ICD and CPT codes and modifiers is the sole responsibility of the health care institution and practicing physician where this report was generated.  [employer]. will not be held responsible for the validity of the ICD and CPT codes included on this report.  AMA assumes no liability for data contained or not contained herein. CPT is a Designer, television/film set of the [employer].   PATIENT NAME:  Paige Levy, Paige Levy MR#: [id number]

## 2014-10-03 NOTE — Progress Notes (Addendum)
[facility]                                                                              Patient Demographics  Paige Levy, is a 73 y.o. female, DOB - 05/03/1941, ERD:[id number]  Admit date - 10/01/2014   Admitting Physician Evalee Mutton Kristeen Mans, MD  Outpatient Primary MD for the patient is Missy Sabins, MD  LOS - 2   Chief Complaint  Patient presents with  . Fever      HPI on 10/01/2014 Paige Levy is a 73 y.o. female with a Past Medical History of dyslipidemia, Mnire's disease, hypothyroidism who presented with chills and epigastric discomfort for the past 2 days.  She described the epigastric pain as "mostly discomfort"-not associated with nausea, vomiting or diarrhea. There was no radiation of her discomfort. Because of persistent nature of the symptoms, she was brought to the hospital, where a CT scan of the abdomen shows choledocholithiasis. She was also found to have significantly elevated LFTs, along with leukocytosis. [facility] was asked to admit this patient for further evaluation and treatment.  Patient denied any history of headache, chest pain, shortness of breath.  Interim history Patient is planned for ERCP today, 10/03/2014 at [facility]   Epigastric pain likely secondary to choledocholithiasis with low-grade cholangitis -Patient did have elevated LFTs, which are improving -CT scan of the abdomen did show findings of choledocholithiasis -Continue Zosyn -General surgery as well as gastroenterology were consulted and appreciated -Patient will likely need cholecystectomy at a later time -ERCP planned today with Dr. Amedeo Plenty, at 10am  Dyslipidemia -Statins currently held due to to elevated LFTs  Chronic Mnire's disease -Continue diuretics -Appears stable  Hypokalemia -resolved, Likely secondary to diuretics -Will replete and continue to monitor BMP  Leukocytosis -Likely secondary to the above -Resolved, will continue to monitor  CBC  Hypothyroidism -Continue Synthroid  Normocytic Anemia -Drop in hemoglobin likely secondary to dilutional component -Will continue to monitor CBC  Code Status: Full  Family Communication: Husband at bedside.  Disposition Plan: Admitted  Time Spent in minutes   25 minutes  Procedures  ERCP  Consults   General surgery Gastroenterology  DVT Prophylaxis  heparin  Lab Results  Component Value Date   PLT 168 10/03/2014    Medications  Scheduled Meds: . heparin  5,000 Units Subcutaneous 3 times per day  . levothyroxine  88 mcg Oral QAC breakfast  . piperacillin-tazobactam (ZOSYN)  IV  3.375 g Intravenous Q8H  . triamterene-hydrochlorothiazide  1 tablet Oral Daily   Continuous Infusions: . sodium chloride 125 mL/hr at 10/03/14 0654  . sodium chloride    . lactated ringers 50 mL/hr at 10/03/14 0940   PRN Meds:.acetaminophen, acetaminophen, albuterol, guaiFENesin-dextromethorphan, morphine injection, ondansetron (ZOFRAN) IV, ondansetron, oxyCODONE  Antibiotics    Anti-infectives   Start     Dose/Rate Route Frequency Ordered Stop   10/02/14 0000  piperacillin-tazobactam (ZOSYN) IVPB 3.375 g     3.375 g 12.5 mL/hr over 240 Minutes Intravenous Every 8 hours 10/01/14 1851     10/01/14 1645  piperacillin-tazobactam (ZOSYN) IVPB 3.375 g     3.375 g 100 mL/hr over 30 Minutes Intravenous  Once 10/01/14  1642 10/02/14 2120        Subjective:   Paige Levy seen and examined today.  Patient denies further abdominal pain.  She wishes to eat actual food.  She states her left should hurts as she slept on it.  She denies chest pain, SOB, nausea, vomiting.   Objective:   Filed Vitals:   10/02/14 1354 10/02/14 2148 10/03/14 0509 10/03/14 0936  BP: 112/60 114/57 98/59 125/64  Pulse: 93 74 68 73  Temp: 97.7 F (36.5 C) 97.7 F (36.5 C) 98.2 F (36.8 C) 97.8 F (36.6 C)  TempSrc: Oral Oral  Oral  Resp: 19 18 16 12   Height:      Weight:      SpO2: 100% 98% 100%  100%    Wt Readings from Last 3 Encounters:  10/01/14 73.029 kg (161 lb)  10/01/14 73.029 kg (161 lb)     Intake/Output Summary (Last 24 hours) at 10/03/14 1001 Last data filed at 10/03/14 0935  Gross per 24 hour  Intake 2971.08 ml  Output      0 ml  Net 2971.08 ml    Exam  General: Well developed, well nourished, NAD, appears stated age  HEENT: NCAT, mucous membranes moist.   Cardiovascular: S1 S2 auscultated, no rubs, murmurs or gallops. Regular rate and rhythm.  Respiratory: Clear to auscultation bilaterally with equal chest rise  Abdomen: Soft, nontender, nondistended, + bowel sounds  Extremities: warm dry without cyanosis clubbing or edema  Neuro: AAOx3, no focal deficits  Psych: Normal affect and demeanor    Data Review   Micro Results Recent Results (from the past 240 hour(s))  CULTURE, BLOOD (ROUTINE X 2)     Status: None   Collection Time    10/01/14  5:02 PM      Result Value Ref Range Status   Specimen Description BLOOD ARM RIGHT   Final   Special Requests BOTTLES DRAWN AEROBIC AND ANAEROBIC 10CC   Final   Culture  Setup Time     Final   Value: 10/01/2014 22:03     Performed at [facility]   Culture     Final   Value:        BLOOD CULTURE RECEIVED NO GROWTH TO DATE CULTURE WILL BE HELD FOR 5 DAYS BEFORE ISSUING A FINAL NEGATIVE REPORT     Performed at [facility]   Report Status PENDING   Incomplete  CULTURE, BLOOD (ROUTINE X 2)     Status: None   Collection Time    10/01/14  5:10 PM      Result Value Ref Range Status   Specimen Description BLOOD ARM LEFT   Final   Special Requests BOTTLES DRAWN AEROBIC AND ANAEROBIC 10CC   Final   Culture  Setup Time     Final   Value: 10/01/2014 22:03     Performed at [facility]   Culture     Final   Value:        BLOOD CULTURE RECEIVED NO GROWTH TO DATE CULTURE WILL BE HELD FOR 5 DAYS BEFORE ISSUING A FINAL NEGATIVE REPORT     Performed at [facility]   Report Status  PENDING   Incomplete    Radiology Reports Dg Chest 2 View  10/01/2014   CLINICAL DATA:  Fever with chills for 1 day  EXAM: CHEST  2 VIEW  COMPARISON:  None.  FINDINGS: Lungs are clear. Heart size and pulmonary vascularity are normal. No adenopathy.  No bone lesions.  IMPRESSION: No edema or consolidation.   Electronically Signed   By: Lowella Grip M.D.   On: 10/01/2014 13:40   Ct Abdomen Pelvis W Contrast  10/01/2014   CLINICAL DATA:  Malaise and fatigue this week, intermittent fever and chills, transaminitis, known gallbladder disease  EXAM: CT ABDOMEN AND PELVIS WITH CONTRAST  TECHNIQUE: Multidetector CT imaging of the abdomen and pelvis was performed using the standard protocol following bolus administration of intravenous contrast. Sagittal and coronal MPR images reconstructed from axial data set.  CONTRAST:  53mL OMNIPAQUE IOHEXOL 300 MG/ML SOLN IV. Dilute oral contrast.  COMPARISON:  MRCP 02/19/2014  FINDINGS: Lung bases clear.  Minimal central intrahepatic biliary dilatation.  Dilated extrahepatic biliary tree with common hepatic duct 13 mm and CBD 11 mm.  Multiple large filling defects are seen within the extrahepatic biliary tree compatible with choledocholithiasis, including a 10 x 16 mm distal CBD stone, 13 x 14 mm common hepatic duct stone, and a 12 x 11 mm stone near the cystic duct confluence.  Remainder of liver, spleen, pancreas, kidneys, and adrenal glands normal.  Diverticulosis of the descending and sigmoid colon without diverticulitis changes.  Appendix not definitely localize, no pericecal inflammatory process seen.  Stomach and small bowel loops unremarkable.  Normal appearing bladder and ureters.  Circumaortic LEFT renal vein.  Minimal atherosclerotic calcification aorta.  No mass, adenopathy, free fluid, or free air.  Scattered calcified mesenteric lymph nodes.  Degenerative changes of RIGHT hip joint.  Osseous demineralization with degenerative disc and facet disease changes of  the thoracolumbar spine.  IMPRESSION: Extrahepatic biliary dilatation with choledocholithiasis, with 3 large stones identified within the extrahepatic bile ducts as above.  Distal colonic diverticulosis without evidence of diverticulitis.  Numerous calcified mesenteric lymph nodes compatible with old granulomatous disease.   Electronically Signed   By: Lavonia Dana M.D.   On: 10/01/2014 16:01    CBC  Recent Labs Lab 10/01/14 1233 10/02/14 0312 10/03/14 0438  WBC 14.3* 6.7 4.4  HGB 13.6 13.2 11.8*  HCT 39.1 37.7 34.6*  PLT 172 168 168  MCV 87.7 87.9 89.2  MCH 30.5 30.8 30.4  MCHC 34.8 35.0 34.1  RDW 13.0 13.3 13.3  LYMPHSABS 0.6*  --   --   MONOABS 0.6  --   --   EOSABS 0.0  --   --   BASOSABS 0.0  --   --     Chemistries   Recent Labs Lab 10/01/14 1233 10/02/14 0312 10/03/14 0438  NA 132* 137 139  K 3.3* 3.0* 3.7  CL 92* 95* 103  CO2 25 26 25   GLUCOSE 127* 93 87  BUN 11 9 7   CREATININE 0.73 0.76 0.89  CALCIUM 8.7 8.8 8.4  AST 220* 120* 53*  ALT 277* 202* 123*  ALKPHOS 301* 255* 198*  BILITOT 1.8* 1.7* 1.0   ------------------------------------------------------------------------------------------------------------------ estimated creatinine clearance is 54.7 ml/min (by C-G formula based on Cr of 0.89). ------------------------------------------------------------------------------------------------------------------ No results found for this basename: HGBA1C,  in the last 72 hours ------------------------------------------------------------------------------------------------------------------ No results found for this basename: CHOL, HDL, LDLCALC, TRIG, CHOLHDL, LDLDIRECT,  in the last 72 hours ------------------------------------------------------------------------------------------------------------------ No results found for this basename: TSH, T4TOTAL, FREET3, T3FREE, THYROIDAB,  in the last 72  hours ------------------------------------------------------------------------------------------------------------------ No results found for this basename: VITAMINB12, FOLATE, FERRITIN, TIBC, IRON, RETICCTPCT,  in the last 72 hours  Coagulation profile  Recent Labs Lab 10/01/14 1643  INR 1.07    No results found for this basename:  DDIMER,  in the last 72 hours  Cardiac Enzymes No results found for this basename: CK, CKMB, TROPONINI, MYOGLOBIN,  in the last 168 hours ------------------------------------------------------------------------------------------------------------------ No components found with this basename: POCBNP,     Tryston Gilliam D.O. on 10/03/2014 at 10:01 AM  Between 7am to 7pm - Pager - [phone]  After 7pm go to [email or web address] - password [email or web address]  And look for the night coverage person covering for me after hours  [facility] Office  [phone]

## 2014-10-03 NOTE — Progress Notes (Signed)
[facility] Progress Note  Subjective: Patient feeling okay no abdominal pain  Objective: Vital signs in last 24 hours: Temp:  [97.7 F (36.5 C)-98.2 F (36.8 C)] 97.8 F (36.6 C) (10/11 0936) Pulse Rate:  [68-93] 73 (10/11 0936) Resp:  [12-19] 12 (10/11 0936) BP: (98-125)/(57-64) 125/64 mmHg (10/11 0936) SpO2:  [98 %-100 %] 100 % (10/11 0936) Weight change:    PE: Abdomen soft  Lab Results: Results for orders placed during the hospital encounter of 10/01/14 (from the past 24 hour(s))  CBC     Status: Abnormal   Collection Time    10/03/14  4:38 AM      Result Value Ref Range   WBC 4.4  4.0 - 10.5 K/uL   RBC 3.88  3.87 - 5.11 MIL/uL   Hemoglobin 11.8 (*) 12.0 - 15.0 g/dL   HCT 34.6 (*) 36.0 - 46.0 %   MCV 89.2  78.0 - 100.0 fL   MCH 30.4  26.0 - 34.0 pg   MCHC 34.1  30.0 - 36.0 g/dL   RDW 13.3  11.5 - 15.5 %   Platelets 168  150 - 400 K/uL  COMPREHENSIVE METABOLIC PANEL     Status: Abnormal   Collection Time    10/03/14  4:38 AM      Result Value Ref Range   Sodium 139  137 - 147 mEq/L   Potassium 3.7  3.7 - 5.3 mEq/L   Chloride 103  96 - 112 mEq/L   CO2 25  19 - 32 mEq/L   Glucose, Bld 87  70 - 99 mg/dL   BUN 7  6 - 23 mg/dL   Creatinine, Ser 0.89  0.50 - 1.10 mg/dL   Calcium 8.4  8.4 - 10.5 mg/dL   Total Protein 5.9 (*) 6.0 - 8.3 g/dL   Albumin 2.8 (*) 3.5 - 5.2 g/dL   AST 53 (*) 0 - 37 U/L   ALT 123 (*) 0 - 35 U/L   Alkaline Phosphatase 198 (*) 39 - 117 U/L   Total Bilirubin 1.0  0.3 - 1.2 mg/dL   GFR calc non Af Amer 62 (*) >90 mL/min   GFR calc Af Amer 72 (*) >90 mL/min   Anion gap 11  5 - 15    Studies/Results: Dg Chest 2 View  10/01/2014   CLINICAL DATA:  Fever with chills for 1 day  EXAM: CHEST  2 VIEW  COMPARISON:  None.  FINDINGS: Lungs are clear. Heart size and pulmonary vascularity are normal. No adenopathy. No bone lesions.  IMPRESSION: No edema or consolidation.   Electronically Signed   By: Lowella Grip M.D.   On: 10/01/2014  13:40   Ct Abdomen Pelvis W Contrast  10/01/2014   CLINICAL DATA:  Malaise and fatigue this week, intermittent fever and chills, transaminitis, known gallbladder disease  EXAM: CT ABDOMEN AND PELVIS WITH CONTRAST  TECHNIQUE: Multidetector CT imaging of the abdomen and pelvis was performed using the standard protocol following bolus administration of intravenous contrast. Sagittal and coronal MPR images reconstructed from axial data set.  CONTRAST:  91mL OMNIPAQUE IOHEXOL 300 MG/ML SOLN IV. Dilute oral contrast.  COMPARISON:  MRCP 02/19/2014  FINDINGS: Lung bases clear.  Minimal central intrahepatic biliary dilatation.  Dilated extrahepatic biliary tree with common hepatic duct 13 mm and CBD 11 mm.  Multiple large filling defects are seen within the extrahepatic biliary tree compatible with choledocholithiasis, including a 10 x 16 mm distal CBD stone, 13 x 14 mm common  hepatic duct stone, and a 12 x 11 mm stone near the cystic duct confluence.  Remainder of liver, spleen, pancreas, kidneys, and adrenal glands normal.  Diverticulosis of the descending and sigmoid colon without diverticulitis changes.  Appendix not definitely localize, no pericecal inflammatory process seen.  Stomach and small bowel loops unremarkable.  Normal appearing bladder and ureters.  Circumaortic LEFT renal vein.  Minimal atherosclerotic calcification aorta.  No mass, adenopathy, free fluid, or free air.  Scattered calcified mesenteric lymph nodes.  Degenerative changes of RIGHT hip joint.  Osseous demineralization with degenerative disc and facet disease changes of the thoracolumbar spine.  IMPRESSION: Extrahepatic biliary dilatation with choledocholithiasis, with 3 large stones identified within the extrahepatic bile ducts as above.  Distal colonic diverticulosis without evidence of diverticulitis.  Numerous calcified mesenteric lymph nodes compatible with old granulomatous disease.   Electronically Signed   By: Lavonia Dana M.D.   On:  10/01/2014 16:01    ERCP showed 3 or 4 very large common bile duct stones as seen on the CT scan. I was not able to enlarged a sphincterotomy and after pull stones through although I was able to partially crushed them with basket catheters. Fragments would come through and I was hesitant to use too much force because of a nearby periampullary diverticulum. I placed a 5 cm 10 French stent with fragmented stones remaining in the common bile duct.  Assessment: 1. Large common bile duct stones, unable to retrieve due to the large size inability to fracture and small enough pieces and relatively small papilla and nearby periampullary diverticulum status post stent placement.  Plan: Continue stent placement, consider ursodeoxycholic acid and will need repeat ERCP at some point with possibly the spy glass cholecystography or referral to teaching center for repeat attempt at removing common bile duct stones. We'll continue antibiotics and check liver function tests in the morning.    Urie Loughner C 10/03/2014, 11:11 AM

## 2014-10-03 NOTE — Transfer of Care (Signed)
Immediate Anesthesia Transfer of Care Note  Patient: Paige Levy  Procedure(s) Performed: Procedure(s): ENDOSCOPIC RETROGRADE CHOLANGIOPANCREATOGRAPHY (ERCP) (Left)  Patient Location: PACU  Anesthesia Type:General  Level of Consciousness: awake, alert  and oriented  Airway & Oxygen Therapy: Patient Spontanous Breathing and Patient connected to nasal cannula oxygen  Post-op Assessment: Report given to PACU RN, Post -op Vital signs reviewed and stable and Patient moving all extremities X 4  Post vital signs: Reviewed and stable  Complications: No apparent anesthesia complications

## 2014-10-03 NOTE — H&P (View-Only) (Signed)
[facility]                                                                              Patient Demographics  Paige Levy, is a 73 y.o. female, DOB - 1941-07-18, XLK:[id number]  Admit date - 10/01/2014   Admitting Physician Evalee Mutton Kristeen Mans, MD  Outpatient Primary MD for the patient is Missy Sabins, MD  LOS - 1   Chief Complaint  Patient presents with  . Fever      HPI on 10/01/2014 Paige Levy is a 73 y.o. female with a Past Medical History of dyslipidemia, Mnire's disease, hypothyroidism who presented with chills and epigastric discomfort for the past 2 days.  She described the epigastric pain as "mostly discomfort"-not associated with nausea, vomiting or diarrhea. There was no radiation of her discomfort. Because of persistent nature of the symptoms, she was brought to the hospital, where a CT scan of the abdomen shows choledocholithiasis. She was also found to have significantly elevated LFTs, along with leukocytosis. [facility] was asked to admit this patient for further evaluation and treatment.  Patient denied any history of headache, chest pain, shortness of breath.   Assessment & Plan   Epigastric pain likely secondary to choledocholithiasis with low-grade cholangitis -Patient did have elevated LFTs, which are improving -CT scan of the abdomen did show findings of choledocholithiasis -Continue Zosyn -General surgery as well as gastroenterology were consulted and appreciated -Patient will likely need cholecystectomy however will likely need ERCP -Pending GI evaluation  Dyslipidemia -Statins currently held due to to elevated LFTs  Chronic Mnire's disease -Continue diuretics -Appears stable  Hypokalemia -Likely secondary to diuretic -Will replete and continue to monitor BMP  Leukocytosis -Likely secondary to the above -Resolved, will continue to monitor CBC  Hypothyroidism -Continue Synthroid  Code Status: Full  Family Communication: Husband at  bedside.  Disposition Plan: Admitted  Time Spent in minutes   30 minutes  Procedures  None  Consults   General surgery Gastroenterology  DVT Prophylaxis  heparin  Lab Results  Component Value Date   PLT 168 10/02/2014    Medications  Scheduled Meds: . heparin  5,000 Units Subcutaneous 3 times per day  . levothyroxine  88 mcg Oral QAC breakfast  . piperacillin-tazobactam (ZOSYN)  IV  3.375 g Intravenous Q8H  . triamterene-hydrochlorothiazide  1 tablet Oral Daily   Continuous Infusions: . sodium chloride 125 mL/hr at 10/02/14 0526   PRN Meds:.acetaminophen, acetaminophen, albuterol, guaiFENesin-dextromethorphan, morphine injection, ondansetron (ZOFRAN) IV, ondansetron, oxyCODONE  Antibiotics    Anti-infectives   Start     Dose/Rate Route Frequency Ordered Stop   10/02/14 0000  piperacillin-tazobactam (ZOSYN) IVPB 3.375 g     3.375 g 12.5 mL/hr over 240 Minutes Intravenous Every 8 hours 10/01/14 1851     10/01/14 1645  piperacillin-tazobactam (ZOSYN) IVPB 3.375 g     3.375 g 100 mL/hr over 30 Minutes Intravenous  Once 10/01/14 1642 10/01/14 1759        Subjective:   Paige Levy seen and examined today.  Patient states her abdominal discomfort has improved. She does state that when she doesn't eat, it does return. He denies any current chest pain, shortness of breath, abdominal  pain, nausea, vomiting.  Objective:   Filed Vitals:   10/01/14 1600 10/01/14 1645 10/01/14 2204 10/02/14 0531  BP: 117/58 122/64 128/57 90/55  Pulse: 80 82 84 72  Temp:   97.7 F (36.5 C) 98.1 F (36.7 C)  TempSrc:   Oral Oral  Resp:   18 18  Height:      Weight:      SpO2: 95% 98% 100% 98%    Wt Readings from Last 3 Encounters:  10/01/14 73.029 kg (161 lb)     Intake/Output Summary (Last 24 hours) at 10/02/14 1039 Last data filed at 10/02/14 0523  Gross per 24 hour  Intake   1526 ml  Output      0 ml  Net   1526 ml    Exam  General: Well developed, well  nourished, NAD, appears stated age  HEENT: NCAT, PERRLA, EOMI, Anicteic Sclera, mucous membranes moist.   Neck: Supple, no JVD, no masses  Cardiovascular: S1 S2 auscultated, no rubs, murmurs or gallops. Regular rate and rhythm.  Respiratory: Clear to auscultation bilaterally with equal chest rise  Abdomen: Soft, nontender, nondistended, + bowel sounds  Extremities: warm dry without cyanosis clubbing or edema  Neuro: AAOx3, no focal  Skin: Without rashes exudates or nodules  Psych: Normal affect and demeanor with intact judgement and insight  Data Review   Micro Results No results found for this or any previous visit (from the past 240 hour(s)).  Radiology Reports Dg Chest 2 View  10/01/2014   CLINICAL DATA:  Fever with chills for 1 day  EXAM: CHEST  2 VIEW  COMPARISON:  None.  FINDINGS: Lungs are clear. Heart size and pulmonary vascularity are normal. No adenopathy. No bone lesions.  IMPRESSION: No edema or consolidation.   Electronically Signed   By: Lowella Grip M.D.   On: 10/01/2014 13:40   Ct Abdomen Pelvis W Contrast  10/01/2014   CLINICAL DATA:  Malaise and fatigue this week, intermittent fever and chills, transaminitis, known gallbladder disease  EXAM: CT ABDOMEN AND PELVIS WITH CONTRAST  TECHNIQUE: Multidetector CT imaging of the abdomen and pelvis was performed using the standard protocol following bolus administration of intravenous contrast. Sagittal and coronal MPR images reconstructed from axial data set.  CONTRAST:  31mL OMNIPAQUE IOHEXOL 300 MG/ML SOLN IV. Dilute oral contrast.  COMPARISON:  MRCP 02/19/2014  FINDINGS: Lung bases clear.  Minimal central intrahepatic biliary dilatation.  Dilated extrahepatic biliary tree with common hepatic duct 13 mm and CBD 11 mm.  Multiple large filling defects are seen within the extrahepatic biliary tree compatible with choledocholithiasis, including a 10 x 16 mm distal CBD stone, 13 x 14 mm common hepatic duct stone, and a 12 x  11 mm stone near the cystic duct confluence.  Remainder of liver, spleen, pancreas, kidneys, and adrenal glands normal.  Diverticulosis of the descending and sigmoid colon without diverticulitis changes.  Appendix not definitely localize, no pericecal inflammatory process seen.  Stomach and small bowel loops unremarkable.  Normal appearing bladder and ureters.  Circumaortic LEFT renal vein.  Minimal atherosclerotic calcification aorta.  No mass, adenopathy, free fluid, or free air.  Scattered calcified mesenteric lymph nodes.  Degenerative changes of RIGHT hip joint.  Osseous demineralization with degenerative disc and facet disease changes of the thoracolumbar spine.  IMPRESSION: Extrahepatic biliary dilatation with choledocholithiasis, with 3 large stones identified within the extrahepatic bile ducts as above.  Distal colonic diverticulosis without evidence of diverticulitis.  Numerous calcified mesenteric lymph nodes compatible  with old granulomatous disease.   Electronically Signed   By: Lavonia Dana M.D.   On: 10/01/2014 16:01    CBC  Recent Labs Lab 10/01/14 1233 10/02/14 0312  WBC 14.3* 6.7  HGB 13.6 13.2  HCT 39.1 37.7  PLT 172 168  MCV 87.7 87.9  MCH 30.5 30.8  MCHC 34.8 35.0  RDW 13.0 13.3  LYMPHSABS 0.6*  --   MONOABS 0.6  --   EOSABS 0.0  --   BASOSABS 0.0  --     Chemistries   Recent Labs Lab 10/01/14 1233 10/02/14 0312  NA 132* 137  K 3.3* 3.0*  CL 92* 95*  CO2 25 26  GLUCOSE 127* 93  BUN 11 9  CREATININE 0.73 0.76  CALCIUM 8.7 8.8  AST 220* 120*  ALT 277* 202*  ALKPHOS 301* 255*  BILITOT 1.8* 1.7*   ------------------------------------------------------------------------------------------------------------------ estimated creatinine clearance is 60.8 ml/min (by C-G formula based on Cr of 0.76). ------------------------------------------------------------------------------------------------------------------ No results found for this basename: HGBA1C,  in the  last 72 hours ------------------------------------------------------------------------------------------------------------------ No results found for this basename: CHOL, HDL, LDLCALC, TRIG, CHOLHDL, LDLDIRECT,  in the last 72 hours ------------------------------------------------------------------------------------------------------------------ No results found for this basename: TSH, T4TOTAL, FREET3, T3FREE, THYROIDAB,  in the last 72 hours ------------------------------------------------------------------------------------------------------------------ No results found for this basename: VITAMINB12, FOLATE, FERRITIN, TIBC, IRON, RETICCTPCT,  in the last 72 hours  Coagulation profile  Recent Labs Lab 10/01/14 1643  INR 1.07    No results found for this basename: DDIMER,  in the last 72 hours  Cardiac Enzymes No results found for this basename: CK, CKMB, TROPONINI, MYOGLOBIN,  in the last 168 hours ------------------------------------------------------------------------------------------------------------------ No components found with this basename: POCBNP,     Candelaria Pies D.O. on 10/02/2014 at 10:39 AM  Between 7am to 7pm - Pager - [phone]  After 7pm go to [email or web address] - password [email or web address]  And look for the night coverage person covering for me after hours  [facility] Office  [phone]

## 2014-10-03 NOTE — Anesthesia Preprocedure Evaluation (Addendum)
Anesthesia Evaluation  Patient identified by MRN, date of birth, ID band Patient awake    Reviewed: Allergy & Precautions, H&P , NPO status , Patient's Chart, lab work & pertinent test results  History of Anesthesia Complications Negative for: history of anesthetic complications  Airway Mallampati: II TM Distance: >3 FB Neck ROM: full    Dental  (+) Teeth Intact, Dental Advidsory Given   Pulmonary neg pulmonary ROS,  breath sounds clear to auscultation        Cardiovascular negative cardio ROS  Rhythm:regular     Neuro/Psych negative neurological ROS  negative psych ROS   GI/Hepatic Neg liver ROS, Common bile duct stone   Endo/Other  Hypothyroidism   Renal/GU negative Renal ROS     Musculoskeletal   Abdominal   Peds  Hematology  (+) anemia ,   Anesthesia Other Findings   Reproductive/Obstetrics                         Anesthesia Physical Anesthesia Plan  ASA: II  Anesthesia Plan: General   Post-op Pain Management:    Induction: Intravenous  Airway Management Planned: Oral ETT  Additional Equipment: None  Intra-op Plan:   Post-operative Plan:   Informed Consent: I have reviewed the patients History and Physical, chart, labs and discussed the procedure including the risks, benefits and alternatives for the proposed anesthesia with the patient or authorized representative who has indicated his/her understanding and acceptance.   Dental Advisory Given  Plan Discussed with: CRNA and Surgeon  Anesthesia Plan Comments:        Anesthesia Quick Evaluation

## 2014-10-03 NOTE — Anesthesia Postprocedure Evaluation (Signed)
  Anesthesia Post-op Note  Patient: Paige Levy  Procedure(s) Performed: Procedure(s): ENDOSCOPIC RETROGRADE CHOLANGIOPANCREATOGRAPHY (ERCP) (Left)  Patient Location: PACU  Anesthesia Type:General  Level of Consciousness: awake  Airway and Oxygen Therapy: Patient Spontanous Breathing  Post-op Pain: none  Post-op Assessment: Post-op Vital signs reviewed, Patient's Cardiovascular Status Stable, Respiratory Function Stable, Patent Airway, No signs of Nausea or vomiting and Pain level controlled  Post-op Vital Signs: Reviewed and stable  Last Vitals:  Filed Vitals:   10/03/14 1155  BP: 103/56  Pulse: 80  Temp: 36.4 C  Resp: 15    Complications: No apparent anesthesia complications

## 2014-10-04 ENCOUNTER — Encounter (HOSPITAL_COMMUNITY): Payer: Self-pay | Admitting: Gastroenterology

## 2014-10-04 ENCOUNTER — Inpatient Hospital Stay (HOSPITAL_COMMUNITY): Payer: Medicare Other

## 2014-10-04 DIAGNOSIS — K8020 Calculus of gallbladder without cholecystitis without obstruction: Secondary | ICD-10-CM

## 2014-10-04 LAB — DIFFERENTIAL
Basophils Absolute: 0 10*3/uL (ref 0.0–0.1)
Basophils Relative: 0 % (ref 0–1)
EOS ABS: 0 10*3/uL (ref 0.0–0.7)
Eosinophils Relative: 0 % (ref 0–5)
LYMPHS ABS: 1.5 10*3/uL (ref 0.7–4.0)
Lymphocytes Relative: 22 % (ref 12–46)
MONOS PCT: 8 % (ref 3–12)
Monocytes Absolute: 0.6 10*3/uL (ref 0.1–1.0)
NEUTROS ABS: 4.7 10*3/uL (ref 1.7–7.7)
NEUTROS PCT: 70 % (ref 43–77)

## 2014-10-04 LAB — COMPREHENSIVE METABOLIC PANEL
ALBUMIN: 2.9 g/dL — AB (ref 3.5–5.2)
ALT: 86 U/L — ABNORMAL HIGH (ref 0–35)
ANION GAP: 13 (ref 5–15)
AST: 31 U/L (ref 0–37)
Alkaline Phosphatase: 174 U/L — ABNORMAL HIGH (ref 39–117)
BUN: 6 mg/dL (ref 6–23)
CO2: 25 mEq/L (ref 19–32)
CREATININE: 0.84 mg/dL (ref 0.50–1.10)
Calcium: 8.6 mg/dL (ref 8.4–10.5)
Chloride: 103 mEq/L (ref 96–112)
GFR calc Af Amer: 77 mL/min — ABNORMAL LOW (ref >90)
GFR calc non Af Amer: 66 mL/min — ABNORMAL LOW (ref >90)
Glucose, Bld: 82 mg/dL (ref 70–99)
Potassium: 3.9 mEq/L (ref 3.7–5.3)
Sodium: 141 mEq/L (ref 137–147)
Total Bilirubin: 0.8 mg/dL (ref 0.3–1.2)
Total Protein: 6.1 g/dL (ref 6.0–8.3)

## 2014-10-04 LAB — CBC
HCT: 35.3 % — ABNORMAL LOW (ref 36.0–46.0)
Hemoglobin: 11.7 g/dL — ABNORMAL LOW (ref 12.0–15.0)
MCH: 30.5 pg (ref 26.0–34.0)
MCHC: 33.1 g/dL (ref 30.0–36.0)
MCV: 92.2 fL (ref 78.0–100.0)
PLATELETS: 198 10*3/uL (ref 150–400)
RBC: 3.83 MIL/uL — ABNORMAL LOW (ref 3.87–5.11)
RDW: 13.3 % (ref 11.5–15.5)
WBC: 6.8 10*3/uL (ref 4.0–10.5)

## 2014-10-04 MED ORDER — URSODIOL 300 MG PO CAPS
300.0000 mg | ORAL_CAPSULE | Freq: Two times a day (BID) | ORAL | Status: DC
  Administered 2014-10-04 – 2014-10-05 (×3): 300 mg via ORAL
  Filled 2014-10-04 (×4): qty 1

## 2014-10-04 NOTE — Progress Notes (Signed)
Patient ID: Paige Levy, female   DOB: 18-Apr-1941, 73 y.o.   MRN: [id number]     [facility]      [address] [place], [place] [address]    Phone: [phone] FAX: [phone]     Subjective: Feeling better, but endorses some discomfort.  No n/v. Tolerating solids.   Objective:  Vital signs:  Filed Vitals:   10/03/14 1155 10/03/14 1309 10/03/14 2155 10/04/14 0459  BP: 103/56 123/58 118/46 102/41  Pulse: 80 80 83 78  Temp: 97.6 F (36.4 C) 97.7 F (36.5 C) 97.6 F (36.4 C) 98.3 F (36.8 C)  TempSrc:  Oral Oral Oral  Resp: 15 18 17 18   Height:      Weight:      SpO2: 100% 100% 99% 92%    Last BM Date: 10/03/14  Intake/Output   Yesterday:  10/11 0701 - 10/12 0700 In: 4087.3 [P.O.:720; I.V.:3067.3; IV Piggyback:300] Out: -  This shift: I/O last 3 completed shifts: In: 5731.3 [P.O.:720; I.V.:4711.3; IV Piggyback:300] Out: -      Physical Exam: General: Pt awake/alert/oriented x4 in no acute distress Abdomen: Soft.  Nondistended. Non tender. No evidence of peritonitis.  No incarcerated hernias.   Problem List:   Active Problems:   Choledocholithiasis   Cholangitis   Meniere's disease   Dyslipidemia    Results:   Labs: Results for orders placed during the hospital encounter of 10/01/14 (from the past 48 hour(s))  CBC     Status: Abnormal   Collection Time    10/03/14  4:38 AM      Result Value Ref Range   WBC 4.4  4.0 - 10.5 K/uL   RBC 3.88  3.87 - 5.11 MIL/uL   Hemoglobin 11.8 (*) 12.0 - 15.0 g/dL   HCT 34.6 (*) 36.0 - 46.0 %   MCV 89.2  78.0 - 100.0 fL   MCH 30.4  26.0 - 34.0 pg   MCHC 34.1  30.0 - 36.0 g/dL   RDW 13.3  11.5 - 15.5 %   Platelets 168  150 - 400 K/uL  COMPREHENSIVE METABOLIC PANEL     Status: Abnormal   Collection Time    10/03/14  4:38 AM      Result Value Ref Range   Sodium 139  137 - 147 mEq/L   Potassium 3.7  3.7 - 5.3 mEq/L   Chloride 103  96 - 112 mEq/L   CO2 25   19 - 32 mEq/L   Glucose, Bld 87  70 - 99 mg/dL   BUN 7  6 - 23 mg/dL   Creatinine, Ser 0.89  0.50 - 1.10 mg/dL   Calcium 8.4  8.4 - 10.5 mg/dL   Total Protein 5.9 (*) 6.0 - 8.3 g/dL   Albumin 2.8 (*) 3.5 - 5.2 g/dL   AST 53 (*) 0 - 37 U/L   ALT 123 (*) 0 - 35 U/L   Alkaline Phosphatase 198 (*) 39 - 117 U/L   Total Bilirubin 1.0  0.3 - 1.2 mg/dL   GFR calc non Af Amer 62 (*) >90 mL/min   GFR calc Af Amer 72 (*) >90 mL/min   Comment: (NOTE)     The eGFR has been calculated using the CKD EPI equation.     This calculation has not been validated in all clinical situations.     eGFR's persistently <90 mL/min signify possible Chronic Kidney     Disease.   Anion gap  11  5 - 15  CBC     Status: Abnormal   Collection Time    10/04/14  5:42 AM      Result Value Ref Range   WBC 6.8  4.0 - 10.5 K/uL   RBC 3.83 (*) 3.87 - 5.11 MIL/uL   Hemoglobin 11.7 (*) 12.0 - 15.0 g/dL   HCT 35.3 (*) 36.0 - 46.0 %   MCV 92.2  78.0 - 100.0 fL   MCH 30.5  26.0 - 34.0 pg   MCHC 33.1  30.0 - 36.0 g/dL   RDW 13.3  11.5 - 15.5 %   Platelets 198  150 - 400 K/uL  COMPREHENSIVE METABOLIC PANEL     Status: Abnormal   Collection Time    10/04/14  5:42 AM      Result Value Ref Range   Sodium 141  137 - 147 mEq/L   Potassium 3.9  3.7 - 5.3 mEq/L   Chloride 103  96 - 112 mEq/L   CO2 25  19 - 32 mEq/L   Glucose, Bld 82  70 - 99 mg/dL   BUN 6  6 - 23 mg/dL   Creatinine, Ser 0.84  0.50 - 1.10 mg/dL   Calcium 8.6  8.4 - 10.5 mg/dL   Total Protein 6.1  6.0 - 8.3 g/dL   Albumin 2.9 (*) 3.5 - 5.2 g/dL   AST 31  0 - 37 U/L   ALT 86 (*) 0 - 35 U/L   Alkaline Phosphatase 174 (*) 39 - 117 U/L   Total Bilirubin 0.8  0.3 - 1.2 mg/dL   GFR calc non Af Amer 66 (*) >90 mL/min   GFR calc Af Amer 77 (*) >90 mL/min   Comment: (NOTE)     The eGFR has been calculated using the CKD EPI equation.     This calculation has not been validated in all clinical situations.     eGFR's persistently <90 mL/min signify possible  Chronic Kidney     Disease.   Anion gap 13  5 - 15  DIFFERENTIAL     Status: None   Collection Time    10/04/14  5:42 AM      Result Value Ref Range   Neutrophils Relative % 70  43 - 77 %   Neutro Abs 4.7  1.7 - 7.7 K/uL   Lymphocytes Relative 22  12 - 46 %   Lymphs Abs 1.5  0.7 - 4.0 K/uL   Monocytes Relative 8  3 - 12 %   Monocytes Absolute 0.6  0.1 - 1.0 K/uL   Eosinophils Relative 0  0 - 5 %   Eosinophils Absolute 0.0  0.0 - 0.7 K/uL   Basophils Relative 0  0 - 1 %   Basophils Absolute 0.0  0.0 - 0.1 K/uL    Imaging / Studies: Dg Ercp Biliary & Pancreatic Ducts  10/03/2014   CLINICAL DATA:  Choledocholithiasis.  EXAM: ERCP  TECHNIQUE: Multiple spot images were obtained with the fluoroscopic device and submitted for interpretation post-procedure.  COMPARISON:  CT abdomen and pelvis 10/01/2014.  FINDINGS: The 4 images submitted demonstrate multiple filling defects within the mildly distended common bile duct consistent with gallstones as noted on CT. A wire is present in the left intrahepatic bile ducts for access. Note is made of calcified lymph nodes in the left upper quadrant the abdomen as noted on CT.  The radiologic technologist documented 10 min 12 sec of fluoroscopy time.  IMPRESSION: Choledocholithiasis.  These images were submitted for radiologic interpretation only. Please see the procedural report for the amount of contrast and the fluoroscopy time utilized.   Electronically Signed   By: Evangeline Dakin M.D.   On: 10/03/2014 13:41    Medications / Allergies:  Scheduled Meds: . heparin  5,000 Units Subcutaneous 3 times per day  . levothyroxine  88 mcg Oral QAC breakfast  . piperacillin-tazobactam (ZOSYN)  IV  3.375 g Intravenous Q8H  . triamterene-hydrochlorothiazide  1 tablet Oral Daily  . ursodiol  300 mg Oral BID   Continuous Infusions: . sodium chloride 125 mL/hr at 10/03/14 1902   PRN Meds:.acetaminophen, acetaminophen, albuterol, fentaNYL,  guaiFENesin-dextromethorphan, morphine injection, ondansetron (ZOFRAN) IV, ondansetron, oxyCODONE  Antibiotics: Anti-infectives   Start     Dose/Rate Route Frequency Ordered Stop   10/02/14 0000  piperacillin-tazobactam (ZOSYN) IVPB 3.375 g     3.375 g 12.5 mL/hr over 240 Minutes Intravenous Every 8 hours 10/01/14 1851     10/01/14 1645  piperacillin-tazobactam (ZOSYN) IVPB 3.375 g     3.375 g 100 mL/hr over 30 Minutes Intravenous  Once 10/01/14 1642 10/02/14 2120      Assessment/Plan Choledocholithiasis, possible low grade cholangitis s/p ERCP, unable to extract stones -will obtain an abdominal US before any definitive surgical recommendations are made -further management per primary team and GI.  Will follow.  Erby Pian, [facility] Pager [phone]) For consults and floor pages call [phone](7A-4:30P)  10/04/2014 8:22 AM

## 2014-10-04 NOTE — Progress Notes (Signed)
[facility]                                                                              Patient Demographics  Paige Levy, is a 73 y.o. female, DOB - 10-14-1941, ALP:[id number]  Admit date - 10/01/2014   Admitting Physician Evalee Mutton Kristeen Mans, MD  Outpatient Primary MD for the patient is Missy Sabins, MD  LOS - 3   Chief Complaint  Patient presents with  . Fever      HPI on 10/01/2014 Paige Levy is a 73 y.o. female with a Past Medical History of dyslipidemia, Mnire's disease, hypothyroidism who presented with chills and epigastric discomfort for the past 2 days.  She described the epigastric pain as "mostly discomfort"-not associated with nausea, vomiting or diarrhea. There was no radiation of her discomfort. Because of persistent nature of the symptoms, she was brought to the hospital, where a CT scan of the abdomen shows choledocholithiasis. She was also found to have significantly elevated LFTs, along with leukocytosis. [facility] was asked to admit this patient for further evaluation and treatment.  Patient denied any history of headache, chest pain, shortness of breath.  Assessment & Plan   Epigastric pain likely secondary to choledocholithiasis with low-grade cholangitis -Patient did have elevated LFTs, which are improving -CT scan of the abdomen did show findings of choledocholithiasis -Continue Zosyn -ERCP 10/03/2014: Stones were not extracted due to large size and small papilla, stent placed -Gastroenterology recommended repeat ERCP however at a later time, would like an additional day of IV antibiotics, and started actigall  -Surgery recommended repeat Abdominal US before making further recommendations  Dyslipidemia -Statins currently held due to to elevated LFTs  Chronic Mnire's disease -Continue diuretics -Appears stable  Hypokalemia -Resolved, Likely secondary to diuretics  Leukocytosis -Resolved, Likely secondary to the  above  Hypothyroidism -Continue Synthroid  Normocytic Anemia -Drop in hemoglobin likely secondary to dilutional component -Hemoglobin currently stable  Code Status: Full  Family Communication: Husband at bedside.  Disposition Plan: Admitted, likely discharge 10/13  Time Spent in minutes   25 minutes  Procedures  ERCP  Consults   General surgery Gastroenterology  DVT Prophylaxis  heparin  Lab Results  Component Value Date   PLT 198 10/04/2014    Medications  Scheduled Meds: . heparin  5,000 Units Subcutaneous 3 times per day  . levothyroxine  88 mcg Oral QAC breakfast  . piperacillin-tazobactam (ZOSYN)  IV  3.375 g Intravenous Q8H  . triamterene-hydrochlorothiazide  1 tablet Oral Daily  . ursodiol  300 mg Oral BID   Continuous Infusions: . sodium chloride 125 mL/hr at 10/03/14 1902   PRN Meds:.acetaminophen, acetaminophen, albuterol, fentaNYL, guaiFENesin-dextromethorphan, morphine injection, ondansetron (ZOFRAN) IV, ondansetron, oxyCODONE  Antibiotics    Anti-infectives   Start     Dose/Rate Route Frequency Ordered Stop   10/02/14 0000  piperacillin-tazobactam (ZOSYN) IVPB 3.375 g     3.375 g 12.5 mL/hr over 240 Minutes Intravenous Every 8 hours 10/01/14 1851     10/01/14 1645  piperacillin-tazobactam (ZOSYN) IVPB 3.375 g     3.375 g 100 mL/hr over 30 Minutes Intravenous  Once 10/01/14 1642 10/02/14 2120        Subjective:  Paige Levy seen and examined today.  Patient denies further abdominal pain.  She would like to go home but understands the need to stay.  She currently denies chest pain, SOB, N/V.   Objective:   Filed Vitals:   10/03/14 1155 10/03/14 1309 10/03/14 2155 10/04/14 0459  BP: 103/56 123/58 118/46 102/41  Pulse: 80 80 83 78  Temp: 97.6 F (36.4 C) 97.7 F (36.5 C) 97.6 F (36.4 C) 98.3 F (36.8 C)  TempSrc:  Oral Oral Oral  Resp: 15 18 17 18   Height:      Weight:      SpO2: 100% 100% 99% 92%    Wt Readings from  Last 3 Encounters:  10/01/14 73.029 kg (161 lb)  10/01/14 73.029 kg (161 lb)     Intake/Output Summary (Last 24 hours) at 10/04/14 1119 Last data filed at 10/04/14 0500  Gross per 24 hour  Intake 3287.25 ml  Output      0 ml  Net 3287.25 ml    Exam  General: Well developed, well nourished, NAD, appears stated age  HEENT: NCAT, mucous membranes moist.   Cardiovascular: S1 S2 auscultated, no rubs, murmurs or gallops. Regular rate and rhythm.  Respiratory: Clear to auscultation bilaterally with equal chest rise  Abdomen: Soft, nontender, nondistended, + bowel sounds  Neuro: AAOx3, no focal deficits  Psych: Normal affect and demeanor    Data Review   Micro Results Recent Results (from the past 240 hour(s))  CULTURE, BLOOD (ROUTINE X 2)     Status: None   Collection Time    10/01/14  5:02 PM      Result Value Ref Range Status   Specimen Description BLOOD ARM RIGHT   Final   Special Requests BOTTLES DRAWN AEROBIC AND ANAEROBIC 10CC   Final   Culture  Setup Time     Final   Value: 10/01/2014 22:03     Performed at [facility]   Culture     Final   Value:        BLOOD CULTURE RECEIVED NO GROWTH TO DATE CULTURE WILL BE HELD FOR 5 DAYS BEFORE ISSUING A FINAL NEGATIVE REPORT     Performed at [facility]   Report Status PENDING   Incomplete  CULTURE, BLOOD (ROUTINE X 2)     Status: None   Collection Time    10/01/14  5:10 PM      Result Value Ref Range Status   Specimen Description BLOOD ARM LEFT   Final   Special Requests BOTTLES DRAWN AEROBIC AND ANAEROBIC 10CC   Final   Culture  Setup Time     Final   Value: 10/01/2014 22:03     Performed at [facility]   Culture     Final   Value:        BLOOD CULTURE RECEIVED NO GROWTH TO DATE CULTURE WILL BE HELD FOR 5 DAYS BEFORE ISSUING A FINAL NEGATIVE REPORT     Performed at [facility]   Report Status PENDING   Incomplete    Radiology Reports Dg Chest 2 View  10/01/2014   CLINICAL  DATA:  Fever with chills for 1 day  EXAM: CHEST  2 VIEW  COMPARISON:  None.  FINDINGS: Lungs are clear. Heart size and pulmonary vascularity are normal. No adenopathy. No bone lesions.  IMPRESSION: No edema or consolidation.   Electronically Signed   By: Lowella Grip M.D.   On: 10/01/2014 13:40   Ct  Abdomen Pelvis W Contrast  10/01/2014   CLINICAL DATA:  Malaise and fatigue this week, intermittent fever and chills, transaminitis, known gallbladder disease  EXAM: CT ABDOMEN AND PELVIS WITH CONTRAST  TECHNIQUE: Multidetector CT imaging of the abdomen and pelvis was performed using the standard protocol following bolus administration of intravenous contrast. Sagittal and coronal MPR images reconstructed from axial data set.  CONTRAST:  49mL OMNIPAQUE IOHEXOL 300 MG/ML SOLN IV. Dilute oral contrast.  COMPARISON:  MRCP 02/19/2014  FINDINGS: Lung bases clear.  Minimal central intrahepatic biliary dilatation.  Dilated extrahepatic biliary tree with common hepatic duct 13 mm and CBD 11 mm.  Multiple large filling defects are seen within the extrahepatic biliary tree compatible with choledocholithiasis, including a 10 x 16 mm distal CBD stone, 13 x 14 mm common hepatic duct stone, and a 12 x 11 mm stone near the cystic duct confluence.  Remainder of liver, spleen, pancreas, kidneys, and adrenal glands normal.  Diverticulosis of the descending and sigmoid colon without diverticulitis changes.  Appendix not definitely localize, no pericecal inflammatory process seen.  Stomach and small bowel loops unremarkable.  Normal appearing bladder and ureters.  Circumaortic LEFT renal vein.  Minimal atherosclerotic calcification aorta.  No mass, adenopathy, free fluid, or free air.  Scattered calcified mesenteric lymph nodes.  Degenerative changes of RIGHT hip joint.  Osseous demineralization with degenerative disc and facet disease changes of the thoracolumbar spine.  IMPRESSION: Extrahepatic biliary dilatation with  choledocholithiasis, with 3 large stones identified within the extrahepatic bile ducts as above.  Distal colonic diverticulosis without evidence of diverticulitis.  Numerous calcified mesenteric lymph nodes compatible with old granulomatous disease.   Electronically Signed   By: Lavonia Dana M.D.   On: 10/01/2014 16:01    CBC  Recent Labs Lab 10/01/14 1233 10/02/14 0312 10/03/14 0438 10/04/14 0542  WBC 14.3* 6.7 4.4 6.8  HGB 13.6 13.2 11.8* 11.7*  HCT 39.1 37.7 34.6* 35.3*  PLT 172 168 168 198  MCV 87.7 87.9 89.2 92.2  MCH 30.5 30.8 30.4 30.5  MCHC 34.8 35.0 34.1 33.1  RDW 13.0 13.3 13.3 13.3  LYMPHSABS 0.6*  --   --  1.5  MONOABS 0.6  --   --  0.6  EOSABS 0.0  --   --  0.0  BASOSABS 0.0  --   --  0.0    Chemistries   Recent Labs Lab 10/01/14 1233 10/02/14 0312 10/03/14 0438 10/04/14 0542  NA 132* 137 139 141  K 3.3* 3.0* 3.7 3.9  CL 92* 95* 103 103  CO2 25 26 25 25   GLUCOSE 127* 93 87 82  BUN 11 9 7 6   CREATININE 0.73 0.76 0.89 0.84  CALCIUM 8.7 8.8 8.4 8.6  AST 220* 120* 53* 31  ALT 277* 202* 123* 86*  ALKPHOS 301* 255* 198* 174*  BILITOT 1.8* 1.7* 1.0 0.8   ------------------------------------------------------------------------------------------------------------------ estimated creatinine clearance is 57.9 ml/min (by C-G formula based on Cr of 0.84). ------------------------------------------------------------------------------------------------------------------ No results found for this basename: HGBA1C,  in the last 72 hours ------------------------------------------------------------------------------------------------------------------ No results found for this basename: CHOL, HDL, LDLCALC, TRIG, CHOLHDL, LDLDIRECT,  in the last 72 hours ------------------------------------------------------------------------------------------------------------------ No results found for this basename: TSH, T4TOTAL, FREET3, T3FREE, THYROIDAB,  in the last 72  hours ------------------------------------------------------------------------------------------------------------------ No results found for this basename: VITAMINB12, FOLATE, FERRITIN, TIBC, IRON, RETICCTPCT,  in the last 72 hours  Coagulation profile  Recent Labs Lab 10/01/14 1643  INR 1.07    No results found for this basename:  DDIMER,  in the last 72 hours  Cardiac Enzymes No results found for this basename: CK, CKMB, TROPONINI, MYOGLOBIN,  in the last 168 hours ------------------------------------------------------------------------------------------------------------------ No components found with this basename: POCBNP,     Paige Levy D.O. on 10/04/2014 at 11:19 AM  Between 7am to 7pm - Pager - [phone]  After 7pm go to [email or web address] - password [email or web address]  And look for the night coverage person covering for me after hours  [facility] Office  [phone]

## 2014-10-04 NOTE — Progress Notes (Signed)
[facility] Progress Note  Subjective: Feels better today, minimal abdominal discomfort, no chills  Objective: Vital signs in last 24 hours: Temp:  [97.4 F (36.3 C)-98.3 F (36.8 C)] 98.3 F (36.8 C) (10/12 0459) Pulse Rate:  [73-86] 78 (10/12 0459) Resp:  [12-20] 18 (10/12 0459) BP: (102-125)/(41-64) 102/41 mmHg (10/12 0459) SpO2:  [92 %-100 %] 92 % (10/12 0459) Weight change:    PE: Abdomen soft nontender  Lab Results: Results for orders placed during the hospital encounter of 10/01/14 (from the past 24 hour(s))  CBC     Status: Abnormal   Collection Time    10/04/14  5:42 AM      Result Value Ref Range   WBC 6.8  4.0 - 10.5 K/uL   RBC 3.83 (*) 3.87 - 5.11 MIL/uL   Hemoglobin 11.7 (*) 12.0 - 15.0 g/dL   HCT 35.3 (*) 36.0 - 46.0 %   MCV 92.2  78.0 - 100.0 fL   MCH 30.5  26.0 - 34.0 pg   MCHC 33.1  30.0 - 36.0 g/dL   RDW 13.3  11.5 - 15.5 %   Platelets 198  150 - 400 K/uL  DIFFERENTIAL     Status: None   Collection Time    10/04/14  5:42 AM      Result Value Ref Range   Neutrophils Relative % 70  43 - 77 %   Neutro Abs 4.7  1.7 - 7.7 K/uL   Lymphocytes Relative 22  12 - 46 %   Lymphs Abs 1.5  0.7 - 4.0 K/uL   Monocytes Relative 8  3 - 12 %   Monocytes Absolute 0.6  0.1 - 1.0 K/uL   Eosinophils Relative 0  0 - 5 %   Eosinophils Absolute 0.0  0.0 - 0.7 K/uL   Basophils Relative 0  0 - 1 %   Basophils Absolute 0.0  0.0 - 0.1 K/uL    Studies/Results: Dg Ercp Biliary & Pancreatic Ducts  10/03/2014   CLINICAL DATA:  Choledocholithiasis.  EXAM: ERCP  TECHNIQUE: Multiple spot images were obtained with the fluoroscopic device and submitted for interpretation post-procedure.  COMPARISON:  CT abdomen and pelvis 10/01/2014.  FINDINGS: The 4 images submitted demonstrate multiple filling defects within the mildly distended common bile duct consistent with gallstones as noted on CT. A wire is present in the left intrahepatic bile ducts for access. Note is made of  calcified lymph nodes in the left upper quadrant the abdomen as noted on CT.  The radiologic technologist documented 10 min 12 sec of fluoroscopy time.  IMPRESSION: Choledocholithiasis.  These images were submitted for radiologic interpretation only. Please see the procedural report for the amount of contrast and the fluoroscopy time utilized.   Electronically Signed   By: Evangeline Dakin M.D.   On: 10/03/2014 13:41      Assessment: Choledocholithiasis with possible low-grade cholangitis, bile clear amber color at time of ERCP yesterday. Unable to extract stones do to large size and small papilla and adjacent periampullary diverticulum.  Plan: Would prefer one more day of antibiotics in the hospital and discharged on oral antibiotics. Will start Actigall to possibly soften stones and will need close outpatient followup with repeat ERCP attempt at some point. Will advance diet today  Plan discharge hopefully tomorrow    Tyheem Boughner C 10/04/2014, 7:34 AM

## 2014-10-04 NOTE — Progress Notes (Signed)
General surgery attending:  I have interviewed and examined this patient today. I have reviewed all for a workup and studies today. I agree with the assessment above.  Her abdomen is soft and she is beginning to tolerate a diet. Gallbladder ultrasound shows several small gallstones but no inflammation of the gallbladder  For now, the main focus should be on resolution of her cholangitis and eventual clearance of her CBD stones. I agree with the treatment plan outlined by Dr. Teena Irani.  Consideration should be given to elective cholecystectomy in the future. I will be happy to see her as an outpatient when Dr. Amedeo Plenty feels this is appropriate.   Edsel Petrin. Dalbert Batman, M.D., [facility] General and Minimally invasive Surgery Breast and Colorectal Surgery

## 2014-10-05 LAB — COMPREHENSIVE METABOLIC PANEL
ALK PHOS: 197 U/L — AB (ref 39–117)
ALT: 86 U/L — AB (ref 0–35)
ANION GAP: 14 (ref 5–15)
AST: 45 U/L — ABNORMAL HIGH (ref 0–37)
Albumin: 2.9 g/dL — ABNORMAL LOW (ref 3.5–5.2)
BILIRUBIN TOTAL: 0.8 mg/dL (ref 0.3–1.2)
BUN: 6 mg/dL (ref 6–23)
CHLORIDE: 99 meq/L (ref 96–112)
CO2: 24 mEq/L (ref 19–32)
Calcium: 8.5 mg/dL (ref 8.4–10.5)
Creatinine, Ser: 0.8 mg/dL (ref 0.50–1.10)
GFR calc non Af Amer: 70 mL/min — ABNORMAL LOW (ref >90)
GFR, EST AFRICAN AMERICAN: 82 mL/min — AB (ref >90)
Glucose, Bld: 89 mg/dL (ref 70–99)
POTASSIUM: 3.5 meq/L — AB (ref 3.7–5.3)
SODIUM: 137 meq/L (ref 137–147)
TOTAL PROTEIN: 6.2 g/dL (ref 6.0–8.3)

## 2014-10-05 MED ORDER — CIPROFLOXACIN HCL 500 MG PO TABS: 500.0000 mg | ORAL_TABLET | Freq: Two times a day (BID) | ORAL | Status: DC

## 2014-10-05 MED ORDER — POTASSIUM CHLORIDE CRYS ER 20 MEQ PO TBCR
40.0000 meq | EXTENDED_RELEASE_TABLET | Freq: Once | ORAL | Status: AC
  Administered 2014-10-05: 40 meq via ORAL
  Filled 2014-10-05: qty 2

## 2014-10-05 MED ORDER — URSODIOL 300 MG PO CAPS: 300.0000 mg | ORAL_CAPSULE | Freq: Two times a day (BID) | ORAL | Status: DC

## 2014-10-05 NOTE — Discharge Instructions (Signed)
Cholecystitis Cholecystitis is an inflammation of your gallbladder. It is usually caused by a buildup of gallstones or sludge (cholelithiasis) in your gallbladder. The gallbladder stores a fluid that helps digest fats (bile). Cholecystitis is serious and needs treatment right away.  CAUSES   Gallstones. Gallstones can block the tube that leads to your gallbladder, causing bile to build up. As bile builds up, the gallbladder becomes inflamed.  Bile duct problems, such as blockage from scarring or kinking.  Tumors. Tumors can stop bile from leaving your gallbladder correctly, causing bile to build up. As bile builds up, the gallbladder becomes inflamed. SYMPTOMS   Nausea.  Vomiting.  Abdominal pain, especially in the upper right area of your abdomen.  Abdominal tenderness or bloating.  Sweating.  Chills.  Fever.  Yellowing of the skin and the whites of the eyes (jaundice). DIAGNOSIS  Your caregiver may order blood tests to look for infection or gallbladder problems. Your caregiver may also order imaging tests, such as an ultrasound or computed tomography (CT) scan. Further tests may include a hepatobiliary iminodiacetic acid (HIDA) scan. This scan allows your caregiver to see your bile move from the liver to the gallbladder and to the small intestine. TREATMENT  A hospital stay is usually necessary to lessen the inflammation of your gallbladder. You may be required to not eat or drink (fast) for a certain amount of time. You may be given medicine to treat pain or an antibiotic medicine to treat an infection. Surgery may be needed to remove your gallbladder (cholecystectomy) once the inflammation has gone down. Surgery may be needed right away if you develop complications such as death of gallbladder tissue (gangrene) or a tear (perforation) of the gallbladder.  [facility] care will depend on your treatment. In general:  If you were given antibiotics, take them as  directed. Finish them even if you start to feel better.  Only take over-the-counter or prescription medicines for pain, discomfort, or fever as directed by your caregiver.  Follow a low-fat diet until you see your caregiver again.  Keep all follow-up visits as directed by your caregiver. SEEK IMMEDIATE MEDICAL CARE IF:   Your pain is increasing and not controlled by medicines.  Your pain moves to another part of your abdomen or to your back.  You have a fever.  You have nausea and vomiting. MAKE SURE YOU:  Understand these instructions.  Will watch your condition.  Will get help right away if you are not doing well or get worse. Document Released: 12/10/2005 Document Revised: 03/03/2012 Document Reviewed: 10/26/2011 [facility] Patient Information 2015 [place], [place]. This information is not intended to replace advice given to you by your health care provider. Make sure you discuss any questions you have with your health care provider.

## 2014-10-05 NOTE — Progress Notes (Signed)
[facility] Progress Note  Subjective: Patient tolerating diet minimal abdominal pain particularly when she coughs.  Objective: Vital signs in last 24 hours: Temp:  [97.7 F (36.5 C)-98.1 F (36.7 C)] 98.1 F (36.7 C) (10/13 0548) Pulse Rate:  [72-78] 72 (10/13 0548) Resp:  [18] 18 (10/13 0548) BP: (114-127)/(40-61) 114/54 mmHg (10/13 0548) SpO2:  [93 %-100 %] 96 % (10/13 0548) Weight change:    PE: Abdomen soft, no icterus  Lab Results: Results for orders placed during the hospital encounter of 10/01/14 (from the past 24 hour(s))  COMPREHENSIVE METABOLIC PANEL     Status: Abnormal   Collection Time    10/04/14 11:18 PM      Result Value Ref Range   Sodium 137  137 - 147 mEq/L   Potassium 3.5 (*) 3.7 - 5.3 mEq/L   Chloride 99  96 - 112 mEq/L   CO2 24  19 - 32 mEq/L   Glucose, Bld 89  70 - 99 mg/dL   BUN 6  6 - 23 mg/dL   Creatinine, Ser 0.80  0.50 - 1.10 mg/dL   Calcium 8.5  8.4 - 10.5 mg/dL   Total Protein 6.2  6.0 - 8.3 g/dL   Albumin 2.9 (*) 3.5 - 5.2 g/dL   AST 45 (*) 0 - 37 U/L   ALT 86 (*) 0 - 35 U/L   Alkaline Phosphatase 197 (*) 39 - 117 U/L   Total Bilirubin 0.8  0.3 - 1.2 mg/dL   GFR calc non Af Amer 70 (*) >90 mL/min   GFR calc Af Amer 82 (*) >90 mL/min   Anion gap 14  5 - 15    Studies/Results: Dg Ercp Biliary & Pancreatic Ducts  10/03/2014   CLINICAL DATA:  Choledocholithiasis.  EXAM: ERCP  TECHNIQUE: Multiple spot images were obtained with the fluoroscopic device and submitted for interpretation post-procedure.  COMPARISON:  CT abdomen and pelvis 10/01/2014.  FINDINGS: The 4 images submitted demonstrate multiple filling defects within the mildly distended common bile duct consistent with gallstones as noted on CT. A wire is present in the left intrahepatic bile ducts for access. Note is made of calcified lymph nodes in the left upper quadrant the abdomen as noted on CT.  The radiologic technologist documented 10 min 12 sec of fluoroscopy time.   IMPRESSION: Choledocholithiasis.  These images were submitted for radiologic interpretation only. Please see the procedural report for the amount of contrast and the fluoroscopy time utilized.   Electronically Signed   By: Evangeline Dakin M.D.   On: 10/03/2014 13:41   US Abdomen Limited Ruq  10/04/2014   CLINICAL DATA:  Choledocholithiasis.  EXAM: US ABDOMEN LIMITED - RIGHT UPPER QUADRANT  COMPARISON:  ERCP of October 03, 2014; CT scan of October 01, 2014.  FINDINGS: Gallbladder:  Multiple small gallstones are noted as well as sludge within the gallbladder lumen. Mild gallbladder wall thickening is noted measured at 3.3 mm. No sonographic Murphy's sign is noted. No pericholecystic fluid is noted.  Common bile duct:  Diameter: 17.5 mm which is severely dilated. Stones are noted in its distal portion near the pancreatic head.  Liver:  Probable pneumobilia is noted consistent with recent ERCP. Intrahepatic biliary dilatation is noted. No focal hepatic parenchymal lesion is noted.  IMPRESSION: Cholelithiasis is noted. Intrahepatic and extrahepatic biliary dilatation is noted secondary to distal common bile duct stones.   Electronically Signed   By: Sabino Dick M.D.   On: 10/04/2014 10:49  Assessment: 1. Choledocholithiasis status post partial fragmentation and stent placement 2. Cholelithiasis  Plan: 1. Okay for discharge from GI standpoint. Given prescriptions for ursodeoxycholic acid and 4 Cipro 500 twice a day for 5 days. 2. I will see her back in the office in 2 or 3 weeks and arrange repeat ERCP 3. After clearance of CBD stones referral to surgery for possible cholecystectomy.    Emalyn Schou C 10/05/2014, 7:17 AM

## 2014-10-05 NOTE — Progress Notes (Signed)
[facility] Progress Note 2 Days Post-Op  Subjective: Patient with no complaints  Objective: Vital signs in last 24 hours: Temp:  [97.7 F (36.5 C)-98.1 F (36.7 C)] 98.1 F (36.7 C) (10/13 0548) Pulse Rate:  [72-78] 72 (10/13 0548) Resp:  [18] 18 (10/13 0548) BP: (114-127)/(40-61) 114/54 mmHg (10/13 0548) SpO2:  [93 %-100 %] 96 % (10/13 0548) Last BM Date: 10/04/14  Intake/Output from previous day: 10/12 0701 - 10/13 0700 In: 3456 [P.O.:630; I.V.:2676; IV Piggyback:150] Out: -  Intake/Output this shift:    General: No acute distress  Lungs: Clear  Abd: Soft, benign  Extremities: No changes  Neuro: Intact  Lab Results:  @LABLAST2 (wbc:2,hgb:2,hct:2,plt:2) BMET  Recent Labs  10/04/14 0542 10/04/14 2318  NA 141 137  K 3.9 3.5*  CL 103 99  CO2 25 24  GLUCOSE 82 89  BUN 6 6  CREATININE 0.84 0.80  CALCIUM 8.6 8.5   PT/INR No results found for this basename: LABPROT, INR,  in the last 72 hours ABG No results found for this basename: PHART, PCO2, PO2, HCO3,  in the last 72 hours  Studies/Results: Dg Ercp Biliary & Pancreatic Ducts  10/03/2014   CLINICAL DATA:  Choledocholithiasis.  EXAM: ERCP  TECHNIQUE: Multiple spot images were obtained with the fluoroscopic device and submitted for interpretation post-procedure.  COMPARISON:  CT abdomen and pelvis 10/01/2014.  FINDINGS: The 4 images submitted demonstrate multiple filling defects within the mildly distended common bile duct consistent with gallstones as noted on CT. A wire is present in the left intrahepatic bile ducts for access. Note is made of calcified lymph nodes in the left upper quadrant the abdomen as noted on CT.  The radiologic technologist documented 10 min 12 sec of fluoroscopy time.  IMPRESSION: Choledocholithiasis.  These images were submitted for radiologic interpretation only. Please see the procedural report for the amount of contrast and the fluoroscopy time utilized.   Electronically Signed   By:  Evangeline Dakin M.D.   On: 10/03/2014 13:41   US Abdomen Limited Ruq  10/04/2014   CLINICAL DATA:  Choledocholithiasis.  EXAM: US ABDOMEN LIMITED - RIGHT UPPER QUADRANT  COMPARISON:  ERCP of October 03, 2014; CT scan of October 01, 2014.  FINDINGS: Gallbladder:  Multiple small gallstones are noted as well as sludge within the gallbladder lumen. Mild gallbladder wall thickening is noted measured at 3.3 mm. No sonographic Murphy's sign is noted. No pericholecystic fluid is noted.  Common bile duct:  Diameter: 17.5 mm which is severely dilated. Stones are noted in its distal portion near the pancreatic head.  Liver:  Probable pneumobilia is noted consistent with recent ERCP. Intrahepatic biliary dilatation is noted. No focal hepatic parenchymal lesion is noted.  IMPRESSION: Cholelithiasis is noted. Intrahepatic and extrahepatic biliary dilatation is noted secondary to distal common bile duct stones.   Electronically Signed   By: Sabino Dick M.D.   On: 10/04/2014 10:49    Anti-infectives: Anti-infectives   Start     Dose/Rate Route Frequency Ordered Stop   10/02/14 0000  piperacillin-tazobactam (ZOSYN) IVPB 3.375 g     3.375 g 12.5 mL/hr over 240 Minutes Intravenous Every 8 hours 10/01/14 1851     10/01/14 1645  piperacillin-tazobactam (ZOSYN) IVPB 3.375 g     3.375 g 100 mL/hr over 30 Minutes Intravenous  Once 10/01/14 1642 10/02/14 2120      Assessment/Plan: s/p Procedure(s): ENDOSCOPIC RETROGRADE CHOLANGIOPANCREATOGRAPHY (ERCP) Discharge Patient to follow up with our surgical group.  Will need common duct cleared  of stones.  LOS: 4 days   Paige Levy. Dahlia Bailiff, MD, FACS [phone] [facility] 10/05/2014

## 2014-10-05 NOTE — Care Management Note (Signed)
  Page 1 of 1   10/05/2014     10:17:14 AM CARE MANAGEMENT NOTE 10/05/2014  Patient:  Paige Levy, Paige Levy   Account Number:  [id number]  Date Initiated:  10/05/2014  Documentation initiated by:  Magdalen Spatz  Subjective/Objective Assessment:     Action/Plan:   Anticipated DC Date:  10/05/2014   Anticipated DC Plan:  HOME/SELF CARE         Choice offered to / List presented to:             Status of service:   Medicare Important Message given?  YES (If response is "NO", the following Medicare IM given date fields will be blank) Date Medicare IM given:  10/05/2014 Medicare IM given by:  Magdalen Spatz Date Additional Medicare IM given:   Additional Medicare IM given by:    Discharge Disposition:    Per UR Regulation:    If discussed at Long Length of Stay Meetings, dates discussed:    Comments:

## 2014-10-05 NOTE — Discharge Summary (Addendum)
Physician Discharge Summary  Paige Levy YPP:[id number] DOB: 1941-09-07 DOA: 10/01/2014  PCP: Missy Sabins, MD  Admit date: 10/01/2014 Discharge date: 10/05/2014  Time spent: 45 minutes  Recommendations for Outpatient Follow-up:  Patient will be discharged home. Patient will need to follow up with Dr. Amedeo Plenty in 3 weeks of discharge for repeat BMP as well as ERCP. She will also need followup with Dr. Dalbert Batman, surgeon, thereafter. Patient should continue taking medications as prescribed. She should resume a heart healthy diet. Patient may resume activity as tolerated.  Discharge Diagnoses:  Epigastric pain secondary to choledocholithiasis with low-grade cholangitis Dyslipidemia Chronic Mnire's disease Hypokalemia Leukocytosis Hypothyroidism Normocytic anemia  Discharge Condition: Stable  Diet recommendation: Heart healthy  Filed Weights   10/01/14 1136  Weight: 73.029 kg (161 lb)    History of present illness:  on 10/01/2014  Paige Levy is a 73 y.o. female with a Past Medical History of dyslipidemia, Mnire's disease, hypothyroidism who presented with chills and epigastric discomfort for the past 2 days. She described the epigastric pain as "mostly discomfort"-not associated with nausea, vomiting or diarrhea. There was no radiation of her discomfort. Because of persistent nature of the symptoms, she was brought to the hospital, where a CT scan of the abdomen shows choledocholithiasis. She was also found to have significantly elevated LFTs, along with leukocytosis. [facility] was asked to admit this patient for further evaluation and treatment. Patient denied any history of headache, chest pain, shortness of breath.   Hospital Course:  Epigastric pain likely secondary to acute choledocholithiasis with low-grade cholangitis  -Patient did have elevated LFTs, which are improving  -CT scan of the abdomen did show findings of choledocholithiasis  -Was placed on Zosyn during her  hospital course -ERCP 10/03/2014: Stones were not extracted due to large size and small papilla, stent placed  -Gastroenterology recommended repeat ERCP in 2-3 weeks. Patient was given a course of ciprofloxacin as well as ursodeoxycholic acid.   -Surgery recommended outpatient followup for elective cholecystectomy  Dyslipidemia  -Statins currently held due to to elevated LFTs   Chronic Mnire's disease  -Continue diuretics  -Appears stable   Hypokalemia  -Resolved, Likely secondary to diuretics   Leukocytosis  -Resolved, Likely secondary to the above   Hypothyroidism  -Continue Synthroid   Normocytic Anemia  -Drop in hemoglobin likely secondary to dilutional component  -Hemoglobin currently stable  Procedures  ERCP   Consults  General surgery  Gastroenterology  Discharge Exam: Filed Vitals:   10/05/14 0548  BP: 114/54  Pulse: 72  Temp: 98.1 F (36.7 C)  Resp: 18   Exam  General: Well developed, well nourished, NAD, appears stated age  HEENT: NCAT, mucous membranes moist.  Cardiovascular: S1 S2 auscultated, no rubs, murmurs or gallops. Regular rate and rhythm.  Respiratory: Clear to auscultation bilaterally with equal chest rise  Abdomen: Soft, nontender, nondistended, + bowel sounds  Neuro: AAOx3, no focal deficits  Psych: Normal affect and demeanor   Discharge Instructions      Discharge Instructions   Discharge instructions    Complete by:  As directed   Patient will be discharged home. Patient will need to follow up with Dr. Amedeo Plenty in 3 weeks of discharge for repeat BMP as well as ERCP. She will also need followup with Dr. Dalbert Batman, surgeon, thereafter. Patient should continue taking medications as prescribed. She should resume a heart healthy diet. Patient may resume activity as tolerated.            Medication List  atorvastatin 10 MG tablet  Commonly known as:  LIPITOR  Take 10 mg by mouth daily.     ciprofloxacin 500 MG tablet    Commonly known as:  CIPRO  Take 1 tablet (500 mg total) by mouth 2 (two) times daily.     fexofenadine 60 MG tablet  Commonly known as:  ALLEGRA  Take 60 mg by mouth 2 (two) times daily.     levothyroxine 88 MCG tablet  Commonly known as:  SYNTHROID, LEVOTHROID  Take 88 mcg by mouth daily before breakfast.     triamterene-hydrochlorothiazide 75-50 MG per tablet  Commonly known as:  MAXZIDE  Take 1 tablet by mouth daily.     ursodiol 300 MG capsule  Commonly known as:  ACTIGALL  Take 1 capsule (300 mg total) by mouth 2 (two) times daily.       No Known Allergies Follow-up Information   Follow up with HAYES,JOHN C, MD In 3 weeks.   Specialty:  Gastroenterology   Contact information:   [address] [place] [address] [phone]       Schedule an appointment as soon as possible for a visit with Adin Hector, MD. (As needed)    Specialty:  General Surgery   Contact information:   [place] [address] [phone]        The results of significant diagnostics from this hospitalization (including imaging, microbiology, ancillary and laboratory) are listed below for reference.    Significant Diagnostic Studies: Dg Chest 2 View  10/01/2014   CLINICAL DATA:  Fever with chills for 1 day  EXAM: CHEST  2 VIEW  COMPARISON:  None.  FINDINGS: Lungs are clear. Heart size and pulmonary vascularity are normal. No adenopathy. No bone lesions.  IMPRESSION: No edema or consolidation.   Electronically Signed   By: Lowella Grip M.D.   On: 10/01/2014 13:40   Ct Abdomen Pelvis W Contrast  10/01/2014   CLINICAL DATA:  Malaise and fatigue this week, intermittent fever and chills, transaminitis, known gallbladder disease  EXAM: CT ABDOMEN AND PELVIS WITH CONTRAST  TECHNIQUE: Multidetector CT imaging of the abdomen and pelvis was performed using the standard protocol following bolus administration of intravenous contrast. Sagittal and  coronal MPR images reconstructed from axial data set.  CONTRAST:  68mL OMNIPAQUE IOHEXOL 300 MG/ML SOLN IV. Dilute oral contrast.  COMPARISON:  MRCP 02/19/2014  FINDINGS: Lung bases clear.  Minimal central intrahepatic biliary dilatation.  Dilated extrahepatic biliary tree with common hepatic duct 13 mm and CBD 11 mm.  Multiple large filling defects are seen within the extrahepatic biliary tree compatible with choledocholithiasis, including a 10 x 16 mm distal CBD stone, 13 x 14 mm common hepatic duct stone, and a 12 x 11 mm stone near the cystic duct confluence.  Remainder of liver, spleen, pancreas, kidneys, and adrenal glands normal.  Diverticulosis of the descending and sigmoid colon without diverticulitis changes.  Appendix not definitely localize, no pericecal inflammatory process seen.  Stomach and small bowel loops unremarkable.  Normal appearing bladder and ureters.  Circumaortic LEFT renal vein.  Minimal atherosclerotic calcification aorta.  No mass, adenopathy, free fluid, or free air.  Scattered calcified mesenteric lymph nodes.  Degenerative changes of RIGHT hip joint.  Osseous demineralization with degenerative disc and facet disease changes of the thoracolumbar spine.  IMPRESSION: Extrahepatic biliary dilatation with choledocholithiasis, with 3 large stones identified within the extrahepatic bile ducts as above.  Distal colonic diverticulosis without  evidence of diverticulitis.  Numerous calcified mesenteric lymph nodes compatible with old granulomatous disease.   Electronically Signed   By: Lavonia Dana M.D.   On: 10/01/2014 16:01   Dg Ercp Biliary & Pancreatic Ducts  10/03/2014   CLINICAL DATA:  Choledocholithiasis.  EXAM: ERCP  TECHNIQUE: Multiple spot images were obtained with the fluoroscopic device and submitted for interpretation post-procedure.  COMPARISON:  CT abdomen and pelvis 10/01/2014.  FINDINGS: The 4 images submitted demonstrate multiple filling defects within the mildly distended  common bile duct consistent with gallstones as noted on CT. A wire is present in the left intrahepatic bile ducts for access. Note is made of calcified lymph nodes in the left upper quadrant the abdomen as noted on CT.  The radiologic technologist documented 10 min 12 sec of fluoroscopy time.  IMPRESSION: Choledocholithiasis.  These images were submitted for radiologic interpretation only. Please see the procedural report for the amount of contrast and the fluoroscopy time utilized.   Electronically Signed   By: Evangeline Dakin M.D.   On: 10/03/2014 13:41   US Abdomen Limited Ruq  10/04/2014   CLINICAL DATA:  Choledocholithiasis.  EXAM: US ABDOMEN LIMITED - RIGHT UPPER QUADRANT  COMPARISON:  ERCP of October 03, 2014; CT scan of October 01, 2014.  FINDINGS: Gallbladder:  Multiple small gallstones are noted as well as sludge within the gallbladder lumen. Mild gallbladder wall thickening is noted measured at 3.3 mm. No sonographic Murphy's sign is noted. No pericholecystic fluid is noted.  Common bile duct:  Diameter: 17.5 mm which is severely dilated. Stones are noted in its distal portion near the pancreatic head.  Liver:  Probable pneumobilia is noted consistent with recent ERCP. Intrahepatic biliary dilatation is noted. No focal hepatic parenchymal lesion is noted.  IMPRESSION: Cholelithiasis is noted. Intrahepatic and extrahepatic biliary dilatation is noted secondary to distal common bile duct stones.   Electronically Signed   By: Sabino Dick M.D.   On: 10/04/2014 10:49    Microbiology: Recent Results (from the past 240 hour(s))  CULTURE, BLOOD (ROUTINE X 2)     Status: None   Collection Time    10/01/14  5:02 PM      Result Value Ref Range Status   Specimen Description BLOOD ARM RIGHT   Final   Special Requests BOTTLES DRAWN AEROBIC AND ANAEROBIC 10CC   Final   Culture  Setup Time     Final   Value: 10/01/2014 22:03     Performed at [facility]   Culture     Final   Value:         BLOOD CULTURE RECEIVED NO GROWTH TO DATE CULTURE WILL BE HELD FOR 5 DAYS BEFORE ISSUING A FINAL NEGATIVE REPORT     Performed at [facility]   Report Status PENDING   Incomplete  CULTURE, BLOOD (ROUTINE X 2)     Status: None   Collection Time    10/01/14  5:10 PM      Result Value Ref Range Status   Specimen Description BLOOD ARM LEFT   Final   Special Requests BOTTLES DRAWN AEROBIC AND ANAEROBIC 10CC   Final   Culture  Setup Time     Final   Value: 10/01/2014 22:03     Performed at [facility]   Culture     Final   Value:        BLOOD CULTURE RECEIVED NO GROWTH TO DATE CULTURE WILL BE HELD FOR 5 DAYS  BEFORE ISSUING A FINAL NEGATIVE REPORT     Performed at [facility]   Report Status PENDING   Incomplete     Labs: Basic Metabolic Panel:  Recent Labs Lab 10/01/14 1233 10/02/14 0312 10/03/14 0438 10/04/14 0542 10/04/14 2318  NA 132* 137 139 141 137  K 3.3* 3.0* 3.7 3.9 3.5*  CL 92* 95* 103 103 99  CO2 25 26 25 25 24   GLUCOSE 127* 93 87 82 89  BUN 11 9 7 6 6   CREATININE 0.73 0.76 0.89 0.84 0.80  CALCIUM 8.7 8.8 8.4 8.6 8.5   Liver Function Tests:  Recent Labs Lab 10/01/14 1233 10/02/14 0312 10/03/14 0438 10/04/14 0542 10/04/14 2318  AST 220* 120* 53* 31 45*  ALT 277* 202* 123* 86* 86*  ALKPHOS 301* 255* 198* 174* 197*  BILITOT 1.8* 1.7* 1.0 0.8 0.8  PROT 7.1 6.7 5.9* 6.1 6.2  ALBUMIN 3.3* 3.1* 2.8* 2.9* 2.9*    Recent Labs Lab 10/01/14 1233  LIPASE 20   No results found for this basename: AMMONIA,  in the last 168 hours CBC:  Recent Labs Lab 10/01/14 1233 10/02/14 0312 10/03/14 0438 10/04/14 0542  WBC 14.3* 6.7 4.4 6.8  NEUTROABS 13.1*  --   --  4.7  HGB 13.6 13.2 11.8* 11.7*  HCT 39.1 37.7 34.6* 35.3*  MCV 87.7 87.9 89.2 92.2  PLT 172 168 168 198   Cardiac Enzymes: No results found for this basename: CKTOTAL, CKMB, CKMBINDEX, TROPONINI,  in the last 168 hours BNP: BNP (last 3 results) No results found for this  basename: PROBNP,  in the last 8760 hours CBG: No results found for this basename: GLUCAP,  in the last 168 hours     Signed:  Cristal Ford  [facility] 10/05/2014, 10:32 AM

## 2014-10-07 LAB — CULTURE, BLOOD (ROUTINE X 2)
CULTURE: NO GROWTH
Culture: NO GROWTH

## 2014-10-20 ENCOUNTER — Observation Stay (HOSPITAL_COMMUNITY): Payer: Medicare Other

## 2014-10-20 ENCOUNTER — Observation Stay (HOSPITAL_COMMUNITY)
Admission: EM | Admit: 2014-10-20 | Discharge: 2014-10-22 | Disposition: A | Discharge status: Home / Self Care | Payer: Medicare Other | Attending: Internal Medicine | Admitting: Internal Medicine

## 2014-10-20 ENCOUNTER — Encounter (HOSPITAL_COMMUNITY): Payer: Self-pay | Admitting: Emergency Medicine

## 2014-10-20 DIAGNOSIS — E785 Hyperlipidemia, unspecified: Secondary | ICD-10-CM | POA: Diagnosis not present

## 2014-10-20 DIAGNOSIS — R1013 Epigastric pain: Secondary | ICD-10-CM | POA: Diagnosis present

## 2014-10-20 DIAGNOSIS — E440 Moderate protein-calorie malnutrition: Secondary | ICD-10-CM | POA: Diagnosis present

## 2014-10-20 DIAGNOSIS — R5381 Other malaise: Secondary | ICD-10-CM | POA: Insufficient documentation

## 2014-10-20 DIAGNOSIS — R110 Nausea: Secondary | ICD-10-CM | POA: Diagnosis not present

## 2014-10-20 DIAGNOSIS — E876 Hypokalemia: Secondary | ICD-10-CM | POA: Diagnosis present

## 2014-10-20 DIAGNOSIS — Z79899 Other long term (current) drug therapy: Secondary | ICD-10-CM | POA: Insufficient documentation

## 2014-10-20 DIAGNOSIS — R109 Unspecified abdominal pain: Secondary | ICD-10-CM

## 2014-10-20 DIAGNOSIS — R531 Weakness: Secondary | ICD-10-CM | POA: Diagnosis not present

## 2014-10-20 DIAGNOSIS — E039 Hypothyroidism, unspecified: Secondary | ICD-10-CM | POA: Diagnosis not present

## 2014-10-20 DIAGNOSIS — K8309 Other cholangitis: Secondary | ICD-10-CM

## 2014-10-20 DIAGNOSIS — K8050 Calculus of bile duct without cholangitis or cholecystitis without obstruction: Secondary | ICD-10-CM

## 2014-10-20 HISTORY — DX: Meniere's disease, unspecified ear: H81.09

## 2014-10-20 HISTORY — DX: Unspecified osteoarthritis, unspecified site: M19.90

## 2014-10-20 LAB — COMPREHENSIVE METABOLIC PANEL
ALT: 21 U/L (ref 0–35)
AST: 26 U/L (ref 0–37)
Albumin: 3.5 g/dL (ref 3.5–5.2)
Alkaline Phosphatase: 110 U/L (ref 39–117)
Anion gap: 13 (ref 5–15)
BUN: 12 mg/dL (ref 6–23)
CALCIUM: 9 mg/dL (ref 8.4–10.5)
CO2: 30 meq/L (ref 19–32)
CREATININE: 0.83 mg/dL (ref 0.50–1.10)
Chloride: 94 mEq/L — ABNORMAL LOW (ref 96–112)
GFR calc Af Amer: 78 mL/min — ABNORMAL LOW (ref >90)
GFR calc non Af Amer: 67 mL/min — ABNORMAL LOW (ref >90)
Glucose, Bld: 116 mg/dL — ABNORMAL HIGH (ref 70–99)
Potassium: 2.8 mEq/L — CL (ref 3.7–5.3)
Sodium: 137 mEq/L (ref 137–147)
Total Bilirubin: 2 mg/dL — ABNORMAL HIGH (ref 0.3–1.2)
Total Protein: 7.3 g/dL (ref 6.0–8.3)

## 2014-10-20 LAB — URINALYSIS, ROUTINE W REFLEX MICROSCOPIC
BILIRUBIN URINE: NEGATIVE
Glucose, UA: NEGATIVE mg/dL
Ketones, ur: NEGATIVE mg/dL
LEUKOCYTES UA: NEGATIVE
NITRITE: NEGATIVE
Protein, ur: NEGATIVE mg/dL
SPECIFIC GRAVITY, URINE: 1.017 (ref 1.005–1.030)
UROBILINOGEN UA: 1 mg/dL (ref 0.0–1.0)
pH: 7 (ref 5.0–8.0)

## 2014-10-20 LAB — CREATININE, SERUM
CREATININE: 0.71 mg/dL (ref 0.50–1.10)
GFR calc Af Amer: >90 mL/min (ref >90)
GFR, EST NON AFRICAN AMERICAN: 82 mL/min — AB (ref >90)

## 2014-10-20 LAB — CBC WITH DIFFERENTIAL/PLATELET
Basophils Absolute: 0 10*3/uL (ref 0.0–0.1)
Basophils Relative: 0 % (ref 0–1)
Eosinophils Absolute: 0.1 10*3/uL (ref 0.0–0.7)
Eosinophils Relative: 2 % (ref 0–5)
HCT: 39.5 % (ref 36.0–46.0)
Hemoglobin: 13.6 g/dL (ref 12.0–15.0)
LYMPHS ABS: 0.3 10*3/uL — AB (ref 0.7–4.0)
LYMPHS PCT: 6 % — AB (ref 12–46)
MCH: 30.2 pg (ref 26.0–34.0)
MCHC: 34.4 g/dL (ref 30.0–36.0)
MCV: 87.8 fL (ref 78.0–100.0)
Monocytes Absolute: 0.2 10*3/uL (ref 0.1–1.0)
Monocytes Relative: 4 % (ref 3–12)
Neutro Abs: 4.7 10*3/uL (ref 1.7–7.7)
Neutrophils Relative %: 88 % — ABNORMAL HIGH (ref 43–77)
PLATELETS: 186 10*3/uL (ref 150–400)
RBC: 4.5 MIL/uL (ref 3.87–5.11)
RDW: 12.7 % (ref 11.5–15.5)
WBC: 5.4 10*3/uL (ref 4.0–10.5)

## 2014-10-20 LAB — URINE MICROSCOPIC-ADD ON

## 2014-10-20 LAB — LIPASE, BLOOD: LIPASE: 25 U/L (ref 11–59)

## 2014-10-20 LAB — CBC
HCT: 38.5 % (ref 36.0–46.0)
Hemoglobin: 13.2 g/dL (ref 12.0–15.0)
MCH: 30.9 pg (ref 26.0–34.0)
MCHC: 34.3 g/dL (ref 30.0–36.0)
MCV: 90.2 fL (ref 78.0–100.0)
Platelets: 196 10*3/uL (ref 150–400)
RBC: 4.27 MIL/uL (ref 3.87–5.11)
RDW: 13 % (ref 11.5–15.5)
WBC: 4.7 10*3/uL (ref 4.0–10.5)

## 2014-10-20 LAB — TROPONIN I: Troponin I: <0.3 ng/mL (ref <0.30)

## 2014-10-20 LAB — MAGNESIUM: Magnesium: 1.7 mg/dL (ref 1.5–2.5)

## 2014-10-20 MED ORDER — SODIUM CHLORIDE 0.9 % IV SOLN
INTRAVENOUS | Status: DC
  Administered 2014-10-20: via INTRAVENOUS
  Administered 2014-10-20: 100 mL/h via INTRAVENOUS
  Administered 2014-10-21: 09:00:00 via INTRAVENOUS

## 2014-10-20 MED ORDER — ONDANSETRON HCL 4 MG/2ML IJ SOLN: 4.0000 mg | Freq: Four times a day (QID) | INTRAMUSCULAR | Status: DC | PRN

## 2014-10-20 MED ORDER — ACETAMINOPHEN 325 MG PO TABS: 650.0000 mg | ORAL_TABLET | Freq: Four times a day (QID) | ORAL | Status: DC | PRN

## 2014-10-20 MED ORDER — ONDANSETRON HCL 4 MG PO TABS: 4.0000 mg | ORAL_TABLET | Freq: Four times a day (QID) | ORAL | Status: DC | PRN

## 2014-10-20 MED ORDER — ACETAMINOPHEN 650 MG RE SUPP: 650.0000 mg | Freq: Four times a day (QID) | RECTAL | Status: DC | PRN

## 2014-10-20 MED ORDER — LEVOTHYROXINE SODIUM 88 MCG PO TABS
88.0000 ug | ORAL_TABLET | Freq: Every day | ORAL | Status: DC
  Administered 2014-10-21 – 2014-10-22 (×2): 88 ug via ORAL
  Filled 2014-10-20 (×3): qty 1

## 2014-10-20 MED ORDER — HYDROCODONE-ACETAMINOPHEN 5-325 MG PO TABS: 1.0000 | ORAL_TABLET | ORAL | Status: DC | PRN

## 2014-10-20 MED ORDER — POLYETHYLENE GLYCOL 3350 17 G PO PACK
17.0000 g | PACK | Freq: Every day | ORAL | Status: DC | PRN
  Filled 2014-10-20: qty 1

## 2014-10-20 MED ORDER — ATORVASTATIN CALCIUM 10 MG PO TABS
10.0000 mg | ORAL_TABLET | Freq: Every day | ORAL | Status: DC
  Administered 2014-10-20 – 2014-10-22 (×3): 10 mg via ORAL
  Filled 2014-10-20 (×3): qty 1

## 2014-10-20 MED ORDER — HEPARIN SODIUM (PORCINE) 5000 UNIT/ML IJ SOLN
5000.0000 [IU] | Freq: Three times a day (TID) | INTRAMUSCULAR | Status: DC
  Administered 2014-10-20 – 2014-10-21 (×5): 5000 [IU] via SUBCUTANEOUS
  Filled 2014-10-20 (×8): qty 1

## 2014-10-20 MED ORDER — POTASSIUM CHLORIDE CRYS ER 20 MEQ PO TBCR
40.0000 meq | EXTENDED_RELEASE_TABLET | Freq: Once | ORAL | Status: AC
  Administered 2014-10-20: 40 meq via ORAL
  Filled 2014-10-20: qty 2

## 2014-10-20 MED ORDER — POTASSIUM CHLORIDE CRYS ER 20 MEQ PO TBCR
40.0000 meq | EXTENDED_RELEASE_TABLET | Freq: Two times a day (BID) | ORAL | Status: AC
  Administered 2014-10-20 (×2): 40 meq via ORAL
  Filled 2014-10-20 (×2): qty 2

## 2014-10-20 NOTE — Progress Notes (Signed)
Report called and received from ED. 

## 2014-10-20 NOTE — Consult Note (Signed)
[facility] CONSULT Reason for consult: nausea Referring Physician: [facility]. PCP: Dr. Modena Morrow. Primary G.I.: Dr. Lianne Bushy [name] is an 73 y.o. female.  HPI: she was hospitalized recently with low-grade cholangitis. She had ERCP 1011 by Dr. Amedeo Plenty. Multiple large stones in the common bile.. Dr. Amedeo Plenty attempted to fragment the stones but was unable to get them all out. Sphincterotomy was performed instant was placed. Her gallbladder is still intact. She was sent home on oral antibiotics. Plan was to bring her back as an outpatient the next several weeks and repeat ERCP fragmentation basket etc. The patient felt fairly well up until about 2 days ago when she began to feel weak and slightly nauseated. She said she felt hot but not nearly as bad as before. No fever or chills. She came into the ER her potassium was 2.8, liver tests were normal other than slight bumping bilirubin 0.8 to 2.0. WBC was normal. Were asked to see her whether or not she needed another ERCP at this time  Past Medical History  Diagnosis Date  . Thyroid disease   . Arthritis   . Meniere's disease     Past Surgical History  Procedure Laterality Date  . Abdominal hysterectomy    . Ercp Left 10/03/2014    Procedure: ENDOSCOPIC RETROGRADE CHOLANGIOPANCREATOGRAPHY (ERCP);  Surgeon: Missy Sabins, MD;  Location: [place];  Service: Gastroenterology;  Laterality: Left;  . Sphincterotomy  09/2014    Family History  Problem Relation Age of Onset  . Other Mother   . Heart attack Mother   . Other Father     Social History:  reports that she has never smoked. She has never used smokeless tobacco. She reports that she does not drink alcohol or use illicit drugs.  Allergies: No Known Allergies  Medications; Prior to Admission medications   Medication Sig Start Date End Date Taking? Authorizing Provider  atorvastatin (LIPITOR) 10 MG tablet Take 10 mg by mouth daily.   Yes Historical Provider, MD   fexofenadine (ALLEGRA) 60 MG tablet Take 60 mg by mouth 2 (two) times daily.   Yes Historical Provider, MD  FLUZONE HIGH-DOSE 0.5 ML SUSY  10/19/14  Yes Historical Provider, MD  levothyroxine (SYNTHROID, LEVOTHROID) 88 MCG tablet Take 88 mcg by mouth daily before breakfast.   Yes Historical Provider, MD  Multiple Vitamin (MULTIVITAMIN WITH MINERALS) TABS tablet Take 1 tablet by mouth daily.   Yes Historical Provider, MD  triamterene-hydrochlorothiazide (MAXZIDE) 75-50 MG per tablet Take 1 tablet by mouth daily.   Yes Historical Provider, MD   . atorvastatin  10 mg Oral Daily  . heparin  5,000 Units Subcutaneous 3 times per day  . [START ON 10/21/2014] levothyroxine  88 mcg Oral QAC breakfast  . potassium chloride  40 mEq Oral BID   PRN Meds acetaminophen, acetaminophen, HYDROcodone-acetaminophen, ondansetron (ZOFRAN) IV, ondansetron, polyethylene glycol Results for orders placed during the hospital encounter of 10/20/14 (from the past 48 hour(s))  COMPREHENSIVE METABOLIC PANEL     Status: Abnormal   Collection Time    10/20/14  7:51 AM      Result Value Ref Range   Sodium 137  137 - 147 mEq/L   Potassium 2.8 (*) 3.7 - 5.3 mEq/L   Comment: CRITICAL RESULT CALLED TO, READ BACK BY AND VERIFIED WITH:     K.COBB,RN [id number] 10/20/14 CLARK,S   Chloride 94 (*) 96 - 112 mEq/L   CO2 30  19 - 32 mEq/L   Glucose, Bld 116 (*)  70 - 99 mg/dL   BUN 12  6 - 23 mg/dL   Creatinine, Ser 0.83  0.50 - 1.10 mg/dL   Calcium 9.0  8.4 - 10.5 mg/dL   Total Protein 7.3  6.0 - 8.3 g/dL   Albumin 3.5  3.5 - 5.2 g/dL   AST 26  0 - 37 U/L   ALT 21  0 - 35 U/L   Alkaline Phosphatase 110  39 - 117 U/L   Total Bilirubin 2.0 (*) 0.3 - 1.2 mg/dL   GFR calc non Af Amer 67 (*) >90 mL/min   GFR calc Af Amer 78 (*) >90 mL/min   Comment: (NOTE)     The eGFR has been calculated using the CKD EPI equation.     This calculation has not been validated in all clinical situations.     eGFR's persistently <90 mL/min signify  possible Chronic Kidney     Disease.   Anion gap 13  5 - 15  CBC WITH DIFFERENTIAL     Status: Abnormal   Collection Time    10/20/14  7:51 AM      Result Value Ref Range   WBC 5.4  4.0 - 10.5 K/uL   RBC 4.50  3.87 - 5.11 MIL/uL   Hemoglobin 13.6  12.0 - 15.0 g/dL   HCT 39.5  36.0 - 46.0 %   MCV 87.8  78.0 - 100.0 fL   MCH 30.2  26.0 - 34.0 pg   MCHC 34.4  30.0 - 36.0 g/dL   RDW 12.7  11.5 - 15.5 %   Platelets 186  150 - 400 K/uL   Neutrophils Relative % 88 (*) 43 - 77 %   Neutro Abs 4.7  1.7 - 7.7 K/uL   Lymphocytes Relative 6 (*) 12 - 46 %   Lymphs Abs 0.3 (*) 0.7 - 4.0 K/uL   Monocytes Relative 4  3 - 12 %   Monocytes Absolute 0.2  0.1 - 1.0 K/uL   Eosinophils Relative 2  0 - 5 %   Eosinophils Absolute 0.1  0.0 - 0.7 K/uL   Basophils Relative 0  0 - 1 %   Basophils Absolute 0.0  0.0 - 0.1 K/uL  LIPASE, BLOOD     Status: None   Collection Time    10/20/14  7:51 AM      Result Value Ref Range   Lipase 25  11 - 59 U/L  TROPONIN I     Status: None   Collection Time    10/20/14  7:51 AM      Result Value Ref Range   Troponin I <0.30  <0.30 ng/mL   Comment:            Due to the release kinetics of cTnI,     a negative result within the first hours     of the onset of symptoms does not rule out     myocardial infarction with certainty.     If myocardial infarction is still suspected,     repeat the test at appropriate intervals.  URINALYSIS, ROUTINE W REFLEX MICROSCOPIC     Status: Abnormal   Collection Time    10/20/14  9:30 AM      Result Value Ref Range   Color, Urine YELLOW  YELLOW   APPearance CLEAR  CLEAR   Specific Gravity, Urine 1.017  1.005 - 1.030   pH 7.0  5.0 - 8.0   Glucose, UA NEGATIVE  NEGATIVE mg/dL  Hgb urine dipstick TRACE (*) NEGATIVE   Bilirubin Urine NEGATIVE  NEGATIVE   Ketones, ur NEGATIVE  NEGATIVE mg/dL   Protein, ur NEGATIVE  NEGATIVE mg/dL   Urobilinogen, UA 1.0  0.0 - 1.0 mg/dL   Nitrite NEGATIVE  NEGATIVE   Leukocytes, UA NEGATIVE   NEGATIVE  URINE MICROSCOPIC-ADD ON     Status: Abnormal   Collection Time    10/20/14  9:30 AM      Result Value Ref Range   Squamous Epithelial / LPF RARE  RARE   WBC, UA 0-2  <3 WBC/hpf   RBC / HPF 0-2  <3 RBC/hpf   Bacteria, UA FEW (*) RARE  CBC     Status: None   Collection Time    10/20/14  2:35 PM      Result Value Ref Range   WBC 4.7  4.0 - 10.5 K/uL   RBC 4.27  3.87 - 5.11 MIL/uL   Hemoglobin 13.2  12.0 - 15.0 g/dL   HCT 38.5  36.0 - 46.0 %   MCV 90.2  78.0 - 100.0 fL   MCH 30.9  26.0 - 34.0 pg   MCHC 34.3  30.0 - 36.0 g/dL   RDW 13.0  11.5 - 15.5 %   Platelets 196  150 - 400 K/uL  CREATININE, SERUM     Status: Abnormal   Collection Time    10/20/14  2:35 PM      Result Value Ref Range   Creatinine, Ser 0.71  0.50 - 1.10 mg/dL   GFR calc non Af Amer 82 (*) >90 mL/min   GFR calc Af Amer >90  >90 mL/min   Comment: (NOTE)     The eGFR has been calculated using the CKD EPI equation.     This calculation has not been validated in all clinical situations.     eGFR's persistently <90 mL/min signify possible Chronic Kidney     Disease.  MAGNESIUM     Status: None   Collection Time    10/20/14  2:35 PM      Result Value Ref Range   Magnesium 1.7  1.5 - 2.5 mg/dL    Dg Abd Acute W/chest  10/20/2014   CLINICAL DATA:  Acute onset sweating and abdominal discomfort  EXAM: ACUTE ABDOMEN SERIES (ABDOMEN 2 VIEW & CHEST 1 VIEW)  COMPARISON:  Chest radiograph October 01, 2014 ; abdominal MRI February 19, 2014  FINDINGS: PA chest: No edema or consolidation. Heart size and pulmonary vascularity are normal. No adenopathy. There is lucency in the medial left glenoid region.  Supine and upright abdomen: There is a biliary stent in the right upper quadrant. There is no bowel dilatation or air-fluid level to suggest obstruction. No free air. A small amount of air in the biliary ductal system is consistent with the stent placement. There are scattered calcifications in the abdomen of uncertain  etiology. There is moderate stool in the colon.  IMPRESSION: Bowel gas pattern unremarkable. Biliary stent in place in right upper quadrant. Scattered calcifications of uncertain etiology.  The lungs are clear.  Lucent area in the medial left glenoid region of uncertain etiology. Advise dedicated left shoulder radiographs to further assess.   Electronically Signed   By: Lowella Grip M.D.   On: 10/20/2014 10:51               Blood pressure 106/90, pulse 81, temperature 99.5 F (37.5 C), temperature source Oral, resp. rate 15, height 5' 5.5" (1.664  m), weight 71.4 kg (157 lb 6.5 oz), SpO2 99.00%.  Physical exam:   General--pleasant white female in no acute distress reading a book on the computer  Eyes- nonicteric Heart-- regular rate and rhythm without murmurs or gallops Lungs--clear Abdomen-- nondistended and soft a good bowel sounds   Assessment: 1. Nausea. Minimal elevation in bilirubin other LFTs normal WBCs normal. She does not clinically appear to have cholangitis. 2. Recent cholangitis. Patient has CBD stones, biliary stent in place, and gallstones  Plan: we will see what her labs look like tomorrow. If her labs from a normal we could probably send her home on some oral antibiotics with follow-up with Dr. Amedeo Plenty next week. If her liver test are increasing or signs of cholangitis in her stent may need to be replaced.   Paige Levy JR,Yukio Bisping L 10/20/2014, 5:24 PM

## 2014-10-20 NOTE — ED Notes (Signed)
MD at bedside completing assessment.

## 2014-10-20 NOTE — ED Notes (Signed)
Critical Lab given to Dr. Lenna Sciara.

## 2014-10-20 NOTE — ED Notes (Signed)
Pt states feeling shivering and sweats starting last night. Pt states hx of gallstones and that "the doctor is trying to shrink them". Denies abdominal pain or nausea. Patient is alert and oriented. Husband at bedside. NAD noted.

## 2014-10-20 NOTE — Progress Notes (Signed)
Aileen Fass MD has been paged. Currently awaiting orders.

## 2014-10-20 NOTE — H&P (Signed)
[facility] History and Physical  Paige Levy GYJ:[id number] DOB: 17-Jun-1941 DOA: 10/20/2014  Referring physician: Dr. Winfred Leeds PCP: Paige Ou, MD   Chief Complaint: Nausea  HPI: Paige Levy is a 73 y.o. female  past medical history of Mnire's disease, hypothyroidism who was recently discharged from the hospital for choledocholithiasis with probable low-grade cholangitis, was sent home on Cipro which she has completed her treatment. ERCP with sphincterotomy and stent placement was done post discharge on 10/04/2011 was doing fine until the day prior to admission when she started feeling chills with nausea she relates she is in mild nauseous and she was discharged but this felt different. GI spoke with Dr. Amedeo Plenty recommended a 23 hour observation.  ED: Abdominal ultrasound shows a dilated duct, she was treated with nausea potassium in IV fluids. Has remained afebrile in the emergency room.  Review of Systems:  Constitutional:  No weight loss, night sweats, Fevers, chills, fatigue.  HEENT:  No headaches, Difficulty swallowing,Tooth/dental problems,Sore throat,  No sneezing, itching, ear ache, nasal congestion, post nasal drip,  Cardio-vascular:  No chest pain, Orthopnea, PND, swelling in lower extremities, anasarca, dizziness, palpitations  Resp:  No shortness of breath with exertion or at rest. No excess mucus, no productive cough, No non-productive cough, No coughing up of blood.No change in color of mucus.No wheezing.No chest wall deformity  Skin:  no rash or lesions.  GU:  no dysuria, change in color of urine, no urgency or frequency. No flank pain.  Musculoskeletal:  No joint pain or swelling. No decreased range of motion. No back pain.  Psych:  No change in mood or affect. No depression or anxiety. No memory loss.   Past Medical History  Diagnosis Date  . Thyroid disease   . Arthritis    Past Surgical History  Procedure Laterality Date  . Abdominal  hysterectomy    . Ercp Left 10/03/2014    Procedure: ENDOSCOPIC RETROGRADE CHOLANGIOPANCREATOGRAPHY (ERCP);  Surgeon: Missy Sabins, MD;  Location: [place];  Service: Gastroenterology;  Laterality: Left;   Social History:  reports that she has never smoked. She does not have any smokeless tobacco history on file. She reports that she does not drink alcohol or use illicit drugs.  No Known Allergies  Family History  Problem Relation Age of Onset  . Other Mother   . Heart attack Mother   . Other Father      Prior to Admission medications   Medication Sig Start Date End Date Taking? Authorizing Provider  atorvastatin (LIPITOR) 10 MG tablet Take 10 mg by mouth daily.   Yes Historical Provider, MD  fexofenadine (ALLEGRA) 60 MG tablet Take 60 mg by mouth 2 (two) times daily.   Yes Historical Provider, MD  FLUZONE HIGH-DOSE 0.5 ML SUSY  10/19/14  Yes Historical Provider, MD  levothyroxine (SYNTHROID, LEVOTHROID) 88 MCG tablet Take 88 mcg by mouth daily before breakfast.   Yes Historical Provider, MD  Multiple Vitamin (MULTIVITAMIN WITH MINERALS) TABS tablet Take 1 tablet by mouth daily.   Yes Historical Provider, MD  triamterene-hydrochlorothiazide (MAXZIDE) 75-50 MG per tablet Take 1 tablet by mouth daily.   Yes Historical Provider, MD   Physical Exam: Filed Vitals:   10/20/14 0950 10/20/14 1000 10/20/14 1102 10/20/14 1141  BP: 113/66 115/60 103/69 110/53  Pulse: 76 71 72 80  Temp:    98.3 F (36.8 C)  TempSrc:    Oral  Resp: 15 14 13 15   Height:    5' 5.5" (1.664 m)  Weight:    71.4 kg (157 lb 6.5 oz)  SpO2: 96% 98% 95% 99%    Wt Readings from Last 3 Encounters:  10/20/14 71.4 kg (157 lb 6.5 oz)  10/01/14 73.029 kg (161 lb)  10/01/14 73.029 kg (161 lb)    General:  Appears calm and comfortable Eyes: PERRL, normal lids, irises & conjunctiva ENT: grossly normal hearing, lips & tongue Neck: no LAD, masses or thyromegaly Cardiovascular: RRR, no m/r/g. No LE  edema. Respiratory: CTA bilaterally, no w/r/r. Normal respiratory effort. Abdomen: soft, nontender nondistended Skin: no rash or induration seen on limited exam Musculoskeletal: grossly normal tone BUE/BLE Neurologic: grossly non-focal.          Labs on Admission:  Basic Metabolic Panel:  Recent Labs Lab 10/20/14 0751  NA 137  K 2.8*  CL 94*  CO2 30  GLUCOSE 116*  BUN 12  CREATININE 0.83  CALCIUM 9.0   Liver Function Tests:  Recent Labs Lab 10/20/14 0751  AST 26  ALT 21  ALKPHOS 110  BILITOT 2.0*  PROT 7.3  ALBUMIN 3.5    Recent Labs Lab 10/20/14 0751  LIPASE 25   No results found for this basename: AMMONIA,  in the last 168 hours CBC:  Recent Labs Lab 10/20/14 0751  WBC 5.4  NEUTROABS 4.7  HGB 13.6  HCT 39.5  MCV 87.8  PLT 186   Cardiac Enzymes:  Recent Labs Lab 10/20/14 0751  TROPONINI <0.30    BNP (last 3 results) No results found for this basename: PROBNP,  in the last 8760 hours CBG: No results found for this basename: GLUCAP,  in the last 168 hours  Radiological Exams on Admission: Dg Abd Acute W/chest  10/20/2014   CLINICAL DATA:  Acute onset sweating and abdominal discomfort  EXAM: ACUTE ABDOMEN SERIES (ABDOMEN 2 VIEW & CHEST 1 VIEW)  COMPARISON:  Chest radiograph October 01, 2014 ; abdominal MRI February 19, 2014  FINDINGS: PA chest: No edema or consolidation. Heart size and pulmonary vascularity are normal. No adenopathy. There is lucency in the medial left glenoid region.  Supine and upright abdomen: There is a biliary stent in the right upper quadrant. There is no bowel dilatation or air-fluid level to suggest obstruction. No free air. A small amount of air in the biliary ductal system is consistent with the stent placement. There are scattered calcifications in the abdomen of uncertain etiology. There is moderate stool in the colon.  IMPRESSION: Bowel gas pattern unremarkable. Biliary stent in place in right upper quadrant. Scattered  calcifications of uncertain etiology.  The lungs are clear.  Lucent area in the medial left glenoid region of uncertain etiology. Advise dedicated left shoulder radiographs to further assess.   Electronically Signed   By: Lowella Grip M.D.   On: 10/20/2014 10:51    EKG: Independently reviewed. None  Assessment/Plan Epigastric pain/Nausea in adult patient: - Unclear etiology. Concern for low-grade cholangitis. A complete metabolic panel was done that shows any increase in her bilirubin from 0.8 on 10/04/2012 to 2.0 on 10/12/2014. She was treated empirically with Cipro when discharged from the hospital for 2 weeks. She has completed this treatment has been off antibiotics for the past 4 days. - I will hold on antibiotics at this point as he has remained afebrile with no leukocytosis.  -Continue to monitor fever curve repeat a CBC along with a complete metabolic panel in the morning. - I will already consulted GI for further evaluation. - I will allow diet. -  Start her on normal saline for 24 hours.  Hypokalemia - Probably due to decreased oral intake and hydrochlorothiazide. Check a mag repleted orally give her a diet started on IV fluids. Her chloride is less than 100 she's probably intravascularly depleted.  Dyslipidemia  -Statins currently held due to to elevated LFT's.  Chronic Mnire's disease  -Continue diuretics  -Appears stable   Hypothyroidism  -Continue Synthroid    Code Status: full DVT Prophylaxis:heparin Family Communication: none Disposition Plan: observation  Time spent: 80 minutes  Charlynne Cousins [facility] Pager [phone]

## 2014-10-20 NOTE — Progress Notes (Signed)
Pt placed on tele box 11 on arrival to unit due to K+=2.8. Per Aileen Fass MD, tele not need and to be removed. Tele monitor removed at this time, will continue to monitor.

## 2014-10-20 NOTE — ED Provider Notes (Signed)
CSN: [id number]     Arrival date & time 10/20/14  5277 History   First MD Initiated Contact with Patient 10/20/14 0715     No chief complaint on file.    (Consider location/radiation/quality/duration/timing/severity/associated sxs/prior Treatment) HPI Complains of vague epigastric discomfort, chills and nausea and generalized weakness, worsening since last night. No treatment prior to coming here no vomiting no known fever. No other associated symptoms. No treatment prior to coming here nothing makes symptoms better or worse. She's had mild nausea since discharged from here on 10/05/2014. Patient was diagnosed with choledocholithiasis. She was scheduled to have follow-up with Dr. Amedeo Plenty from gastroenterology service and Dr. Dalbert Batman from surgery. No other associated symptoms Past Medical History  Diagnosis Date  . Thyroid disease    Past Surgical History  Procedure Laterality Date  . Abdominal hysterectomy    . Ercp Left 10/03/2014    Procedure: ENDOSCOPIC RETROGRADE CHOLANGIOPANCREATOGRAPHY (ERCP);  Surgeon: Missy Sabins, MD;  Location: [facility];  Service: Gastroenterology;  Laterality: Left;   No family history on file. History  Substance Use Topics  . Smoking status: Never Smoker   . Smokeless tobacco: Not on file  . Alcohol Use: No   OB History   Grav Para Term Preterm Abortions TAB SAB Ect Mult Living                 Review of Systems  Constitutional: Positive for chills.  HENT: Negative.   Respiratory: Negative.   Cardiovascular: Negative.   Gastrointestinal: Positive for nausea and abdominal pain.  Musculoskeletal: Negative.   Skin: Negative.   Neurological: Positive for weakness.  Psychiatric/Behavioral: Negative.   All other systems reviewed and are negative.     Allergies  Review of patient's allergies indicates no known allergies.  Home Medications   Prior to Admission medications   Medication Sig Start Date End Date Taking? Authorizing Provider   atorvastatin (LIPITOR) 10 MG tablet Take 10 mg by mouth daily.    Historical Provider, MD  ciprofloxacin (CIPRO) 500 MG tablet Take 1 tablet (500 mg total) by mouth 2 (two) times daily. 10/05/14   Maryann Mikhail, DO  fexofenadine (ALLEGRA) 60 MG tablet Take 60 mg by mouth 2 (two) times daily.    Historical Provider, MD  levothyroxine (SYNTHROID, LEVOTHROID) 88 MCG tablet Take 88 mcg by mouth daily before breakfast.    Historical Provider, MD  triamterene-hydrochlorothiazide (MAXZIDE) 75-50 MG per tablet Take 1 tablet by mouth daily.    Historical Provider, MD  ursodiol (ACTIGALL) 300 MG capsule Take 1 capsule (300 mg total) by mouth 2 (two) times daily. 10/05/14   Maryann Mikhail, DO   There were no vitals taken for this visit. Physical Exam  Nursing note and vitals reviewed. Constitutional: She appears well-developed and well-nourished.  HENT:  Head: Normocephalic and atraumatic.  Eyes: Conjunctivae are normal. Pupils are equal, round, and reactive to light.  Neck: Neck supple. No tracheal deviation present. No thyromegaly present.  Cardiovascular: Normal rate and regular rhythm.   No murmur heard. Pulmonary/Chest: Effort normal and breath sounds normal.  Abdominal: Soft. Bowel sounds are normal. She exhibits no distension. There is no tenderness.  Musculoskeletal: Normal range of motion. She exhibits no edema and no tenderness.  Neurological: She is alert. Coordination normal.  Skin: Skin is warm and dry. No rash noted.  Psychiatric: She has a normal mood and affect.    ED Course  Procedures (including critical care time) Labs Review Labs Reviewed - No data to display  Imaging Review No results found.   EKG Interpretation None      Date: 10/20/2014  Rate: 75  Rhythm: normal sinus rhythm  QRS Axis: normal  Intervals: normal  ST/T Wave abnormalities: nonspecific T wave changes  Conduction Disutrbances:none  Narrative Interpretation:   Old EKG Reviewed: noSignificant  change from 10/01/2014 interpreted by me Results for orders placed during the hospital encounter of 10/20/14  COMPREHENSIVE METABOLIC PANEL      Result Value Ref Range   Sodium 137  137 - 147 mEq/L   Potassium 2.8 (*) 3.7 - 5.3 mEq/L   Chloride 94 (*) 96 - 112 mEq/L   CO2 30  19 - 32 mEq/L   Glucose, Bld 116 (*) 70 - 99 mg/dL   BUN 12  6 - 23 mg/dL   Creatinine, Ser 0.83  0.50 - 1.10 mg/dL   Calcium 9.0  8.4 - 10.5 mg/dL   Total Protein 7.3  6.0 - 8.3 g/dL   Albumin 3.5  3.5 - 5.2 g/dL   AST 26  0 - 37 U/L   ALT 21  0 - 35 U/L   Alkaline Phosphatase 110  39 - 117 U/L   Total Bilirubin 2.0 (*) 0.3 - 1.2 mg/dL   GFR calc non Af Amer 67 (*) >90 mL/min   GFR calc Af Amer 78 (*) >90 mL/min   Anion gap 13  5 - 15  CBC WITH DIFFERENTIAL      Result Value Ref Range   WBC 5.4  4.0 - 10.5 K/uL   RBC 4.50  3.87 - 5.11 MIL/uL   Hemoglobin 13.6  12.0 - 15.0 g/dL   HCT 39.5  36.0 - 46.0 %   MCV 87.8  78.0 - 100.0 fL   MCH 30.2  26.0 - 34.0 pg   MCHC 34.4  30.0 - 36.0 g/dL   RDW 12.7  11.5 - 15.5 %   Platelets 186  150 - 400 K/uL   Neutrophils Relative % 88 (*) 43 - 77 %   Neutro Abs 4.7  1.7 - 7.7 K/uL   Lymphocytes Relative 6 (*) 12 - 46 %   Lymphs Abs 0.3 (*) 0.7 - 4.0 K/uL   Monocytes Relative 4  3 - 12 %   Monocytes Absolute 0.2  0.1 - 1.0 K/uL   Eosinophils Relative 2  0 - 5 %   Eosinophils Absolute 0.1  0.0 - 0.7 K/uL   Basophils Relative 0  0 - 1 %   Basophils Absolute 0.0  0.0 - 0.1 K/uL  LIPASE, BLOOD      Result Value Ref Range   Lipase 25  11 - 59 U/L  URINALYSIS, ROUTINE W REFLEX MICROSCOPIC      Result Value Ref Range   Color, Urine YELLOW  YELLOW   APPearance CLEAR  CLEAR   Specific Gravity, Urine 1.017  1.005 - 1.030   pH 7.0  5.0 - 8.0   Glucose, UA NEGATIVE  NEGATIVE mg/dL   Hgb urine dipstick TRACE (*) NEGATIVE   Bilirubin Urine NEGATIVE  NEGATIVE   Ketones, ur NEGATIVE  NEGATIVE mg/dL   Protein, ur NEGATIVE  NEGATIVE mg/dL   Urobilinogen, UA 1.0  0.0 - 1.0  mg/dL   Nitrite NEGATIVE  NEGATIVE   Leukocytes, UA NEGATIVE  NEGATIVE  TROPONIN I      Result Value Ref Range   Troponin I <0.30  <0.30 ng/mL  URINE MICROSCOPIC-ADD ON      Result Value Ref  Range   Squamous Epithelial / LPF RARE  RARE   WBC, UA 0-2  <3 WBC/hpf   RBC / HPF 0-2  <3 RBC/hpf   Bacteria, UA FEW (*) RARE   Dg Chest 2 View  10/01/2014   CLINICAL DATA:  Fever with chills for 1 day  EXAM: CHEST  2 VIEW  COMPARISON:  None.  FINDINGS: Lungs are clear. Heart size and pulmonary vascularity are normal. No adenopathy. No bone lesions.  IMPRESSION: No edema or consolidation.   Electronically Signed   By: Lowella Grip M.D.   On: 10/01/2014 13:40   Ct Abdomen Pelvis W Contrast  10/01/2014   CLINICAL DATA:  Malaise and fatigue this week, intermittent fever and chills, transaminitis, known gallbladder disease  EXAM: CT ABDOMEN AND PELVIS WITH CONTRAST  TECHNIQUE: Multidetector CT imaging of the abdomen and pelvis was performed using the standard protocol following bolus administration of intravenous contrast. Sagittal and coronal MPR images reconstructed from axial data set.  CONTRAST:  60mL OMNIPAQUE IOHEXOL 300 MG/ML SOLN IV. Dilute oral contrast.  COMPARISON:  MRCP 02/19/2014  FINDINGS: Lung bases clear.  Minimal central intrahepatic biliary dilatation.  Dilated extrahepatic biliary tree with common hepatic duct 13 mm and CBD 11 mm.  Multiple large filling defects are seen within the extrahepatic biliary tree compatible with choledocholithiasis, including a 10 x 16 mm distal CBD stone, 13 x 14 mm common hepatic duct stone, and a 12 x 11 mm stone near the cystic duct confluence.  Remainder of liver, spleen, pancreas, kidneys, and adrenal glands normal.  Diverticulosis of the descending and sigmoid colon without diverticulitis changes.  Appendix not definitely localize, no pericecal inflammatory process seen.  Stomach and small bowel loops unremarkable.  Normal appearing bladder and ureters.   Circumaortic LEFT renal vein.  Minimal atherosclerotic calcification aorta.  No mass, adenopathy, free fluid, or free air.  Scattered calcified mesenteric lymph nodes.  Degenerative changes of RIGHT hip joint.  Osseous demineralization with degenerative disc and facet disease changes of the thoracolumbar spine.  IMPRESSION: Extrahepatic biliary dilatation with choledocholithiasis, with 3 large stones identified within the extrahepatic bile ducts as above.  Distal colonic diverticulosis without evidence of diverticulitis.  Numerous calcified mesenteric lymph nodes compatible with old granulomatous disease.   Electronically Signed   By: Lavonia Dana M.D.   On: 10/01/2014 16:01   Dg Ercp Biliary & Pancreatic Ducts  10/03/2014   CLINICAL DATA:  Choledocholithiasis.  EXAM: ERCP  TECHNIQUE: Multiple spot images were obtained with the fluoroscopic device and submitted for interpretation post-procedure.  COMPARISON:  CT abdomen and pelvis 10/01/2014.  FINDINGS: The 4 images submitted demonstrate multiple filling defects within the mildly distended common bile duct consistent with gallstones as noted on CT. A wire is present in the left intrahepatic bile ducts for access. Note is made of calcified lymph nodes in the left upper quadrant the abdomen as noted on CT.  The radiologic technologist documented 10 min 12 sec of fluoroscopy time.  IMPRESSION: Choledocholithiasis.  These images were submitted for radiologic interpretation only. Please see the procedural report for the amount of contrast and the fluoroscopy time utilized.   Electronically Signed   By: Evangeline Dakin M.D.   On: 10/03/2014 13:41   US Abdomen Limited Ruq  10/04/2014   CLINICAL DATA:  Choledocholithiasis.  EXAM: US ABDOMEN LIMITED - RIGHT UPPER QUADRANT  COMPARISON:  ERCP of October 03, 2014; CT scan of October 01, 2014.  FINDINGS: Gallbladder:  Multiple small gallstones are  noted as well as sludge within the gallbladder lumen. Mild gallbladder wall  thickening is noted measured at 3.3 mm. No sonographic Murphy's sign is noted. No pericholecystic fluid is noted.  Common bile duct:  Diameter: 17.5 mm which is severely dilated. Stones are noted in its distal portion near the pancreatic head.  Liver:  Probable pneumobilia is noted consistent with recent ERCP. Intrahepatic biliary dilatation is noted. No focal hepatic parenchymal lesion is noted.  IMPRESSION: Cholelithiasis is noted. Intrahepatic and extrahepatic biliary dilatation is noted secondary to distal common bile duct stones.   Electronically Signed   By: Sabino Dick M.D.   On: 10/04/2014 10:49    MDM  Spoke with Dr. Amedeo Plenty. He feels the patient should undergo 23 hour observation in light of biliary disease. I also spoke with Dr.Feliz who accepts patient for 23 hour observation. MedSurg floor Diagnoses #1 abdominal pain 2 hypokalemia Final diagnoses:  None        Orlie Dakin, MD 10/20/14 1016

## 2014-10-20 NOTE — Progress Notes (Signed)
Paige Levy [id number] Code Status: Full   Admission Data: 10/20/2014 11:45 AM Attending Provider:  Aileen Fass [id number], Lynelle Smoke, MD Consults/ Treatment Team: Treatment Team:  Winfield Cunas., MD  Paige Levy is a 73 y.o. female patient admitted from ED awake, alert - oriented  X 3 - no acute distress noted.  VSS - Blood pressure 110/53, pulse 80, temperature 98.3 F (36.8 C), temperature source Oral, resp. rate 15, height 5' 5.5" (1.664 m), weight 71.4 kg (157 lb 6.5 oz), SpO2 99.00%.    IV in place, occlusive dsg intact without redness.  Orientation to room, and floor completed with information packet given to patient/family.  Patient Safety video not working at this time.  Admission INP armband ID verified with patient/family, and in place.   SR up x 2, fall assessment complete, with patient and family able to verbalize understanding of risk associated with falls, and verbalized understanding to call nsg before up out of bed.  Call light within reach, patient able to voice, and demonstrate understanding.  Skin, clean-dry- intact without evidence of bruising, or skin tears.   No evidence of skin break down noted on exam.     Will cont to eval and treat per MD orders.  Henriette Combs, [place] 10/20/2014 11:45 AM

## 2014-10-21 DIAGNOSIS — E440 Moderate protein-calorie malnutrition: Secondary | ICD-10-CM

## 2014-10-21 DIAGNOSIS — E785 Hyperlipidemia, unspecified: Secondary | ICD-10-CM

## 2014-10-21 DIAGNOSIS — R110 Nausea: Secondary | ICD-10-CM

## 2014-10-21 DIAGNOSIS — K8050 Calculus of bile duct without cholangitis or cholecystitis without obstruction: Secondary | ICD-10-CM

## 2014-10-21 DIAGNOSIS — E876 Hypokalemia: Secondary | ICD-10-CM

## 2014-10-21 DIAGNOSIS — K830 Cholangitis: Secondary | ICD-10-CM

## 2014-10-21 DIAGNOSIS — R109 Unspecified abdominal pain: Secondary | ICD-10-CM

## 2014-10-21 DIAGNOSIS — R1013 Epigastric pain: Principal | ICD-10-CM

## 2014-10-21 LAB — CBC
HCT: 34.2 % — ABNORMAL LOW (ref 36.0–46.0)
HEMOGLOBIN: 11.4 g/dL — AB (ref 12.0–15.0)
MCH: 30.7 pg (ref 26.0–34.0)
MCHC: 33.3 g/dL (ref 30.0–36.0)
MCV: 92.2 fL (ref 78.0–100.0)
Platelets: 168 10*3/uL (ref 150–400)
RBC: 3.71 MIL/uL — AB (ref 3.87–5.11)
RDW: 13.3 % (ref 11.5–15.5)
WBC: 2.6 10*3/uL — ABNORMAL LOW (ref 4.0–10.5)

## 2014-10-21 LAB — COMPREHENSIVE METABOLIC PANEL
ALBUMIN: 2.6 g/dL — AB (ref 3.5–5.2)
ALT: 18 U/L (ref 0–35)
ANION GAP: 9 (ref 5–15)
AST: 25 U/L (ref 0–37)
Alkaline Phosphatase: 80 U/L (ref 39–117)
BUN: 8 mg/dL (ref 6–23)
CALCIUM: 8.4 mg/dL (ref 8.4–10.5)
CO2: 26 mEq/L (ref 19–32)
Chloride: 107 mEq/L (ref 96–112)
Creatinine, Ser: 0.72 mg/dL (ref 0.50–1.10)
GFR calc non Af Amer: 82 mL/min — ABNORMAL LOW (ref >90)
GLUCOSE: 88 mg/dL (ref 70–99)
Potassium: 4.5 mEq/L (ref 3.7–5.3)
Sodium: 142 mEq/L (ref 137–147)
TOTAL PROTEIN: 5.5 g/dL — AB (ref 6.0–8.3)
Total Bilirubin: 0.8 mg/dL (ref 0.3–1.2)

## 2014-10-21 MED ORDER — SODIUM CHLORIDE 0.9 % IV SOLN
INTRAVENOUS | Status: DC
  Administered 2014-10-21: 20:00:00 via INTRAVENOUS

## 2014-10-21 MED ORDER — ENSURE COMPLETE PO LIQD
237.0000 mL | Freq: Two times a day (BID) | ORAL | Status: DC
  Administered 2014-10-21 – 2014-10-22 (×3): 237 mL via ORAL

## 2014-10-21 NOTE — Progress Notes (Signed)
UR completed 

## 2014-10-21 NOTE — Progress Notes (Signed)
[facility] PROGRESS NOTE Subjective patient feels much better this morning. No abdominal pain or nausea. Afebrile overnight, liver test completely normal this morning. Bilirubin down to normal. WBC down somewhat from yesterday for unclear reasons.  Objective: Vital signs in last 24 hours: Temp:  [97.6 F (36.4 C)-99.5 F (37.5 C)] 98.4 F (36.9 C) (10/29 0700) Pulse Rate:  [71-81] 75 (10/29 0700) Resp:  [13-16] 16 (10/29 0700) BP: (101-130)/(42-100) 101/42 mmHg (10/29 0700) SpO2:  [95 %-99 %] 98 % (10/29 0700) Weight:  [71.4 kg (157 lb 6.5 oz)] 71.4 kg (157 lb 6.5 oz) (10/28 1141) Last BM Date: 10/19/14  Intake/Output from previous day: 10/28 0701 - 10/29 0700 In: 526.7 [P.O.:120; I.V.:406.7] Out: -  Intake/Output this shift:    PE: General--no acute distress sitting up in bed watching computer  Abdomen-- soft and completely nontender  Lab Results:  Recent Labs  10/20/14 0751 10/20/14 1435 10/21/14 0549  WBC 5.4 4.7 2.6*  HGB 13.6 13.2 11.4*  HCT 39.5 38.5 34.2*  PLT 186 196 168   BMET  Recent Labs  10/20/14 0751 10/20/14 1435 10/21/14 0549  NA 137  --  142  K 2.8*  --  4.5  CL 94*  --  107  CO2 30  --  26  CREATININE 0.83 0.71 0.72   LFT  Recent Labs  10/20/14 0751 10/21/14 0549  PROT 7.3 5.5*  AST 26 25  ALT 21 18  ALKPHOS 110 80  BILITOT 2.0* 0.8   PT/INR No results found for this basename: LABPROT, INR,  in the last 72 hours PANCREAS  Recent Labs  10/20/14 0751  LIPASE 25         Studies/Results: Dg Abd Acute W/chest  10/20/2014   CLINICAL DATA:  Acute onset sweating and abdominal discomfort  EXAM: ACUTE ABDOMEN SERIES (ABDOMEN 2 VIEW & CHEST 1 VIEW)  COMPARISON:  Chest radiograph October 01, 2014 ; abdominal MRI February 19, 2014  FINDINGS: PA chest: No edema or consolidation. Heart size and pulmonary vascularity are normal. No adenopathy. There is lucency in the medial left glenoid region.  Supine and upright abdomen:  There is a biliary stent in the right upper quadrant. There is no bowel dilatation or air-fluid level to suggest obstruction. No free air. A small amount of air in the biliary ductal system is consistent with the stent placement. There are scattered calcifications in the abdomen of uncertain etiology. There is moderate stool in the colon.  IMPRESSION: Bowel gas pattern unremarkable. Biliary stent in place in right upper quadrant. Scattered calcifications of uncertain etiology.  The lungs are clear.  Lucent area in the medial left glenoid region of uncertain etiology. Advise dedicated left shoulder radiographs to further assess.   Electronically Signed   By: Lowella Grip M.D.   On: 10/20/2014 10:51    Medications: I have reviewed the patient's current medications.  Assessment/Plan: 1. Nausea. This appears to have resolved. She did have a slight bump in bilirubin with other LFT's normal and now this is packed down to normal. I don't feel that the stent is obstructed. I think if she eats this morning and has no problems she could be discharged and follow-up in the office next week with Dr. Amedeo Plenty. She has an appointment scheduled with him some time in early November already. I have discussed this with she and her husband.   Jerris Fleer JR,Ayse Mccartin L 10/21/2014, 8:13 AM

## 2014-10-21 NOTE — Progress Notes (Addendum)
INITIAL NUTRITION ASSESSMENT  DOCUMENTATION CODES Per approved criteria  -Non-severe (moderate) malnutrition in the context of chronic illness    Pt meets criteria for non-severe (moderate) MALNUTRITION in the context of chronic illness as evidenced by mild depletion of muscle mass and 15% weight loss within 6 months.   INTERVENTION:  Ensure Complete PO BID, each supplement provides 350 kcal and 13 grams of protein  NUTRITION DIAGNOSIS: Malnutrition related to inadequate oral intake as evidenced by mild depletion of muscle mass and 15% weight loss within 6 months.   Goal: Intake to meet >90% of estimated nutrition needs.  Monitor:  PO intake, labs, weight trend.  Reason for Assessment: MST  73 y.o. female  Admitting Dx: Nausea, Chills  ASSESSMENT: 73 y.o. female past medical history of Mnire's disease, hypothyroidism who was recently discharged from the hospital for choledocholithiasis with probable low-grade cholangitis. The day prior to admission she started feeling chills with nausea.  Patient reports that she lost ~23 lbs starting in April 2015; she has been unable to regain this weight. Over the past week, she has had nausea and been eating poorly, losing another few pounds. Discussed with patient the importance of adequate nutrition intake. She agreed to Ensure supplements. Encouraged patient to continue Ensure supplements at home until appetite and intake return to normal. Patient consumed ~ half of her breakfast, husband finished tray.  Nutrition Focused Physical Exam:  Subcutaneous Fat:  Orbital Region: WNL Upper Arm Region: WNL Thoracic and Lumbar Region: WNL  Muscle:  Temple Region: mild depletion Clavicle Bone Region: moderate depletion Clavicle and Acromion Bone Region: WNL Scapular Bone Region: WNL Dorsal Hand: mild depletion Patellar Region: WNL Anterior Thigh Region: WNL Posterior Calf Region: WNL  Edema: none   Height: Ht Readings from Last 1  Encounters:  10/20/14 5' 5.5" (1.664 m)    Weight: Wt Readings from Last 1 Encounters:  10/20/14 157 lb 6.5 oz (71.4 kg)    Ideal Body Weight: 58 kg  % Ideal Body Weight: 123%  Wt Readings from Last 10 Encounters:  10/20/14 157 lb 6.5 oz (71.4 kg)  10/01/14 161 lb (73.029 kg)  10/01/14 161 lb (73.029 kg)    Usual Body Weight: ~ 184 lb  % Usual Body Weight: 85%  BMI:  Body mass index is 25.79 kg/(m^2).  Estimated Nutritional Needs: Kcal: 1450-1650 Protein: 80-95 gm Fluid: >/=1.5 L  Skin: intact  Diet Order: General  EDUCATION NEEDS: -Education needs addressed   Intake/Output Summary (Last 24 hours) at 10/21/14 1022 Last data filed at 10/21/14 1014  Gross per 24 hour  Intake 766.67 ml  Output      0 ml  Net 766.67 ml    Last BM: 10/27   Labs:   Recent Labs Lab 10/20/14 0751 10/20/14 1435 10/21/14 0549  NA 137  --  142  K 2.8*  --  4.5  CL 94*  --  107  CO2 30  --  26  BUN 12  --  8  CREATININE 0.83 0.71 0.72  CALCIUM 9.0  --  8.4  MG  --  1.7  --   GLUCOSE 116*  --  88    CBG (last 3)  No results found for this basename: GLUCAP,  in the last 72 hours  Scheduled Meds: . atorvastatin  10 mg Oral Daily  . heparin  5,000 Units Subcutaneous 3 times per day  . levothyroxine  88 mcg Oral QAC breakfast    Continuous Infusions: . sodium chloride  100 mL/hr at 10/21/14 2774    Past Medical History  Diagnosis Date  . Thyroid disease   . Arthritis   . Meniere's disease     Past Surgical History  Procedure Laterality Date  . Abdominal hysterectomy    . Ercp Left 10/03/2014    Procedure: ENDOSCOPIC RETROGRADE CHOLANGIOPANCREATOGRAPHY (ERCP);  Surgeon: Missy Sabins, MD;  Location: [facility];  Service: Gastroenterology;  Laterality: Left;  . Sphincterotomy  09/2014    Molli Barrows, RD, LDN, [place] Pager [phone] After Hours Pager [phone]

## 2014-10-21 NOTE — Progress Notes (Signed)
[facility] PROGRESS NOTE   Paige Levy MKL:[id number] DOB: 05/14/1941 DOA: 10/20/2014 PCP: Florina Ou, MD  HPI/Subjective: Seen with her husband at bedside, denies any nausea vomiting this morning Seen almost was eating her breakfast.  Assessment/Plan: Active Problems:   Epigastric pain   Nausea in adult patient   Hypokalemia   Malnutrition of moderate degree    Epigastric pain/Nausea in adult patient:  -Unclear etiology. Concern for low-grade cholangitis, with chills and hyperbilirubinemia. -No antibiotics prescribed. -Continue to monitor fever curve repeat a CBC along with a complete metabolic panel in the morning.  -I will already consulted GI for further evaluation.  -Monitor if she tolerates regular food. -Check CMP in a.m., follow total bilirubin.  Hypokalemia  -Probably due to decreased oral intake and hydrochlorothiazide.  -Magnesium within normal limits, potassium repleted orally.  Dyslipidemia  -Statins currently held due to to elevated LFT's.  -Likely will be restarted on discharge  Chronic Mnire's disease  -Continue diuretics  -Appears stable   Hypothyroidism  -Continue Synthroid    Code Status: Full code Family Communication: Plan discussed with the patient. Disposition Plan: Remains inpatient   Consultants:  GI  Procedures:  None  Antibiotics:  None.  Objective: Filed Vitals:   10/21/14 1100  BP: 119/55  Pulse: 78  Temp: 97.6 F (36.4 C)  Resp: 16    Intake/Output Summary (Last 24 hours) at 10/21/14 1145 Last data filed at 10/21/14 1014  Gross per 24 hour  Intake 766.67 ml  Output      0 ml  Net 766.67 ml   Filed Weights   10/20/14 1141  Weight: 71.4 kg (157 lb 6.5 oz)    Exam: General: Alert and awake, oriented x3, not in any acute distress. HEENT: anicteric sclera, pupils reactive to light and accommodation, EOMI CVS: S1-S2 clear, no murmur rubs or gallops Chest: clear to auscultation bilaterally,  no wheezing, rales or rhonchi Abdomen: soft nontender, nondistended, normal bowel sounds, no organomegaly Extremities: no cyanosis, clubbing or edema noted bilaterally Neuro: Cranial nerves II-XII intact, no focal neurological deficits  Data Reviewed: Basic Metabolic Panel:  Recent Labs Lab 10/20/14 0751 10/20/14 1435 10/21/14 0549  NA 137  --  142  K 2.8*  --  4.5  CL 94*  --  107  CO2 30  --  26  GLUCOSE 116*  --  88  BUN 12  --  8  CREATININE 0.83 0.71 0.72  CALCIUM 9.0  --  8.4  MG  --  1.7  --    Liver Function Tests:  Recent Labs Lab 10/20/14 0751 10/21/14 0549  AST 26 25  ALT 21 18  ALKPHOS 110 80  BILITOT 2.0* 0.8  PROT 7.3 5.5*  ALBUMIN 3.5 2.6*    Recent Labs Lab 10/20/14 0751  LIPASE 25   No results found for this basename: AMMONIA,  in the last 168 hours CBC:  Recent Labs Lab 10/20/14 0751 10/20/14 1435 10/21/14 0549  WBC 5.4 4.7 2.6*  NEUTROABS 4.7  --   --   HGB 13.6 13.2 11.4*  HCT 39.5 38.5 34.2*  MCV 87.8 90.2 92.2  PLT 186 196 168   Cardiac Enzymes:  Recent Labs Lab 10/20/14 0751  TROPONINI <0.30   BNP (last 3 results) No results found for this basename: PROBNP,  in the last 8760 hours CBG: No results found for this basename: GLUCAP,  in the last 168 hours  Micro No results found for this or any previous visit (from the  past 240 hour(s)).   Studies: Dg Abd Acute W/chest  10/20/2014   CLINICAL DATA:  Acute onset sweating and abdominal discomfort  EXAM: ACUTE ABDOMEN SERIES (ABDOMEN 2 VIEW & CHEST 1 VIEW)  COMPARISON:  Chest radiograph October 01, 2014 ; abdominal MRI February 19, 2014  FINDINGS: PA chest: No edema or consolidation. Heart size and pulmonary vascularity are normal. No adenopathy. There is lucency in the medial left glenoid region.  Supine and upright abdomen: There is a biliary stent in the right upper quadrant. There is no bowel dilatation or air-fluid level to suggest obstruction. No free air. A small amount  of air in the biliary ductal system is consistent with the stent placement. There are scattered calcifications in the abdomen of uncertain etiology. There is moderate stool in the colon.  IMPRESSION: Bowel gas pattern unremarkable. Biliary stent in place in right upper quadrant. Scattered calcifications of uncertain etiology.  The lungs are clear.  Lucent area in the medial left glenoid region of uncertain etiology. Advise dedicated left shoulder radiographs to further assess.   Electronically Signed   By: Lowella Grip M.D.   On: 10/20/2014 10:51    Scheduled Meds: . atorvastatin  10 mg Oral Daily  . feeding supplement (ENSURE COMPLETE)  237 mL Oral BID BM  . heparin  5,000 Units Subcutaneous 3 times per day  . levothyroxine  88 mcg Oral QAC breakfast   Continuous Infusions: . sodium chloride 100 mL/hr at 10/21/14 0911       Time spent: 35 minutes    [facility]  [facility] Pager [phone] If 7PM-7AM, please contact night-coverage at [email or web address], password [facility] 10/21/2014, 11:45 AM  LOS: 1 day

## 2014-10-22 LAB — COMPREHENSIVE METABOLIC PANEL
ALK PHOS: 80 U/L (ref 39–117)
ALT: 19 U/L (ref 0–35)
AST: 22 U/L (ref 0–37)
Albumin: 2.6 g/dL — ABNORMAL LOW (ref 3.5–5.2)
Anion gap: 7 (ref 5–15)
BUN: 7 mg/dL (ref 6–23)
CO2: 28 mEq/L (ref 19–32)
Calcium: 8.4 mg/dL (ref 8.4–10.5)
Chloride: 107 mEq/L (ref 96–112)
Creatinine, Ser: 0.75 mg/dL (ref 0.50–1.10)
GFR calc Af Amer: >90 mL/min (ref >90)
GFR calc non Af Amer: 81 mL/min — ABNORMAL LOW (ref >90)
Glucose, Bld: 87 mg/dL (ref 70–99)
POTASSIUM: 4.2 meq/L (ref 3.7–5.3)
SODIUM: 142 meq/L (ref 137–147)
TOTAL PROTEIN: 5.3 g/dL — AB (ref 6.0–8.3)
Total Bilirubin: 0.5 mg/dL (ref 0.3–1.2)

## 2014-10-22 NOTE — Progress Notes (Signed)
[facility] PROGRESS NOTE Subjective patient eating with no pain or nausea  Objective: Vital signs in last 24 hours: Temp:  [97.6 F (36.4 C)-98.6 F (37 C)] 98.6 F (37 C) (10/30 0518) Pulse Rate:  [72-78] 78 (10/30 0518) Resp:  [16] 16 (10/30 0518) BP: (105-119)/(51-63) 114/60 mmHg (10/30 0518) SpO2:  [97 %-99 %] 98 % (10/30 0518) Last BM Date: 10/21/14  Intake/Output from previous day: 10/29 0701 - 10/30 0700 In: 1695 [P.O.:720; I.V.:975] Out: -  Intake/Output this shift:    PE: General--no distress  Abdomen-- softer nontender  Lab Results:  Recent Labs  10/20/14 0751 10/20/14 1435 10/21/14 0549  WBC 5.4 4.7 2.6*  HGB 13.6 13.2 11.4*  HCT 39.5 38.5 34.2*  PLT 186 196 168   BMET  Recent Labs  10/20/14 0751 10/20/14 1435 10/21/14 0549 10/22/14 0408  NA 137  --  142 142  K 2.8*  --  4.5 4.2  CL 94*  --  107 107  CO2 30  --  26 28  CREATININE 0.83 0.71 0.72 0.75   LFT  Recent Labs  10/20/14 0751 10/21/14 0549 10/22/14 0408  PROT 7.3 5.5* 5.3*  AST 26 25 22   ALT 21 18 19   ALKPHOS 110 80 80  BILITOT 2.0* 0.8 0.5   PT/INR No results found for this basename: LABPROT, INR,  in the last 72 hours PANCREAS  Recent Labs  10/20/14 0751  LIPASE 25         Studies/Results: Dg Abd Acute W/chest  10/20/2014   CLINICAL DATA:  Acute onset sweating and abdominal discomfort  EXAM: ACUTE ABDOMEN SERIES (ABDOMEN 2 VIEW & CHEST 1 VIEW)  COMPARISON:  Chest radiograph October 01, 2014 ; abdominal MRI February 19, 2014  FINDINGS: PA chest: No edema or consolidation. Heart size and pulmonary vascularity are normal. No adenopathy. There is lucency in the medial left glenoid region.  Supine and upright abdomen: There is a biliary stent in the right upper quadrant. There is no bowel dilatation or air-fluid level to suggest obstruction. No free air. A small amount of air in the biliary ductal system is consistent with the stent placement. There are  scattered calcifications in the abdomen of uncertain etiology. There is moderate stool in the colon.  IMPRESSION: Bowel gas pattern unremarkable. Biliary stent in place in right upper quadrant. Scattered calcifications of uncertain etiology.  The lungs are clear.  Lucent area in the medial left glenoid region of uncertain etiology. Advise dedicated left shoulder radiographs to further assess.   Electronically Signed   By: Lowella Grip M.D.   On: 10/20/2014 10:51    Medications: I have reviewed the patient's current medications.  Assessment/Plan: 1. Abdominal pain/Nausea. Not clear what the etiology for this is but liver test are normal and I doubt that her biliary stent is included. She has appointment in the next 2 weeks to see Dr. Amedeo Plenty. My office will call this afternoon and make arrangements for her to come Monday or Tuesday to have labs repeated. I think she should be able to be discharged today. She can call if there is any further problems.   Shayra Anton JR,Dawne Casali L 10/22/2014, 8:00 AM

## 2014-10-22 NOTE — Progress Notes (Signed)
Pt given discharge instructions.  PIV removed.  Pt taken to discharge location via wheelchair.

## 2014-10-22 NOTE — Discharge Summary (Signed)
Physician Discharge Summary  Paige Levy VWU:[id number] DOB: 14-Aug-1941 DOA: 10/20/2014  PCP: Florina Ou, MD  Admit date: 10/20/2014 Discharge date: 10/22/2014  Time spent: 40 minutes  Recommendations for Outpatient Follow-up:  1. Follow-up with gastroenterology within 2 weeks  Discharge Diagnoses:  Active Problems:   Epigastric pain   Nausea in adult patient   Hypokalemia   Malnutrition of moderate degree   Discharge Condition: Stable  Diet recommendation: Until they didn't  Filed Weights   10/20/14 1141  Weight: 71.4 kg (157 lb 6.5 oz)    History of present illness:  Paige Levy is a 73 y.o. female past medical history of Mnire's disease, hypothyroidism who was recently discharged from the hospital for choledocholithiasis with probable low-grade cholangitis, was sent home on Cipro which she has completed her treatment. ERCP with sphincterotomy and stent placement was done post discharge on 10/04/2011 was doing fine until the day prior to admission when she started feeling chills with nausea she relates she is in mild nauseous and she was discharged but this felt different. GI spoke with Dr. Amedeo Plenty recommended a 23 hour observation.  Hospital Course:   Epigastric pain/Nausea in adult patient:  -Unclear etiology. Concern for low-grade cholangitis, with chills and hyperbilirubinemia.  -No antibiotics prescribed.  -Continue to monitor fever curve repeat a CBC along with a complete metabolic panel in the morning.  -GI consulted for further evaluation, recommended to monitor and defer using antibiotics. -Patient is started on regular food and her LFTs repeated in the morning. -Patient tolerated regular food very well and LFTs back to normal, discharged home to follow-up with Dr. Amedeo Plenty of [facility] gastroenterology.  Hypokalemia  -Probably due to decreased oral intake and hydrochlorothiazide.  -Magnesium within normal limits, potassium repleted orally.   Dyslipidemia   -Statins currently held due to to elevated LFT's.  -Likely will be restarted on discharge.   Chronic Mnire's disease  -Continue diuretics. -Appears stable.   Hypothyroidism  -Continue Synthroid    Procedures:  None  Consultations:  None  Discharge Exam: Filed Vitals:   10/22/14 0518  BP: 114/60  Pulse: 78  Temp: 98.6 F (37 C)  Resp: 16   General: Alert and awake, oriented x3, not in any acute distress. HEENT: anicteric sclera, pupils reactive to light and accommodation, EOMI CVS: S1-S2 clear, no murmur rubs or gallops Chest: clear to auscultation bilaterally, no wheezing, rales or rhonchi Abdomen: soft nontender, nondistended, normal bowel sounds, no organomegaly Extremities: no cyanosis, clubbing or edema noted bilaterally Neuro: Cranial nerves II-XII intact, no focal neurological deficits  Discharge Instructions You were cared for by a hospitalist during your hospital stay. If you have any questions about your discharge medications or the care you received while you were in the hospital after you are discharged, you can call the unit and asked to speak with the hospitalist on call if the hospitalist that took care of you is not available. Once you are discharged, your primary care physician will handle any further medical issues. Please note that NO REFILLS for any discharge medications will be authorized once you are discharged, as it is imperative that you return to your primary care physician (or establish a relationship with a primary care physician if you do not have one) for your aftercare needs so that they can reassess your need for medications and monitor your lab values.  Discharge Instructions   Diet - low sodium heart healthy    Complete by:  As directed      Increase  activity slowly    Complete by:  As directed           Current Discharge Medication List    CONTINUE these medications which have NOT CHANGED   Details  atorvastatin (LIPITOR) 10 MG  tablet Take 10 mg by mouth daily.    fexofenadine (ALLEGRA) 60 MG tablet Take 60 mg by mouth 2 (two) times daily.    FLUZONE HIGH-DOSE 0.5 ML SUSY     levothyroxine (SYNTHROID, LEVOTHROID) 88 MCG tablet Take 88 mcg by mouth daily before breakfast.    Multiple Vitamin (MULTIVITAMIN WITH MINERALS) TABS tablet Take 1 tablet by mouth daily.    triamterene-hydrochlorothiazide (MAXZIDE) 75-50 MG per tablet Take 1 tablet by mouth daily.       No Known Allergies Follow-up Information   Follow up with Florina Ou, MD In 2 weeks.   Specialty:  Family Medicine   Contact information:   [address] [place] [id number] [phone]        The results of significant diagnostics from this hospitalization (including imaging, microbiology, ancillary and laboratory) are listed below for reference.    Significant Diagnostic Studies: Dg Chest 2 View  10/01/2014   CLINICAL DATA:  Fever with chills for 1 day  EXAM: CHEST  2 VIEW  COMPARISON:  None.  FINDINGS: Lungs are clear. Heart size and pulmonary vascularity are normal. No adenopathy. No bone lesions.  IMPRESSION: No edema or consolidation.   Electronically Signed   By: Lowella Grip M.D.   On: 10/01/2014 13:40   Ct Abdomen Pelvis W Contrast  10/01/2014   CLINICAL DATA:  Malaise and fatigue this week, intermittent fever and chills, transaminitis, known gallbladder disease  EXAM: CT ABDOMEN AND PELVIS WITH CONTRAST  TECHNIQUE: Multidetector CT imaging of the abdomen and pelvis was performed using the standard protocol following bolus administration of intravenous contrast. Sagittal and coronal MPR images reconstructed from axial data set.  CONTRAST:  47mL OMNIPAQUE IOHEXOL 300 MG/ML SOLN IV. Dilute oral contrast.  COMPARISON:  MRCP 02/19/2014  FINDINGS: Lung bases clear.  Minimal central intrahepatic biliary dilatation.  Dilated extrahepatic biliary tree with common hepatic duct 13 mm and CBD 11 mm.  Multiple  large filling defects are seen within the extrahepatic biliary tree compatible with choledocholithiasis, including a 10 x 16 mm distal CBD stone, 13 x 14 mm common hepatic duct stone, and a 12 x 11 mm stone near the cystic duct confluence.  Remainder of liver, spleen, pancreas, kidneys, and adrenal glands normal.  Diverticulosis of the descending and sigmoid colon without diverticulitis changes.  Appendix not definitely localize, no pericecal inflammatory process seen.  Stomach and small bowel loops unremarkable.  Normal appearing bladder and ureters.  Circumaortic LEFT renal vein.  Minimal atherosclerotic calcification aorta.  No mass, adenopathy, free fluid, or free air.  Scattered calcified mesenteric lymph nodes.  Degenerative changes of RIGHT hip joint.  Osseous demineralization with degenerative disc and facet disease changes of the thoracolumbar spine.  IMPRESSION: Extrahepatic biliary dilatation with choledocholithiasis, with 3 large stones identified within the extrahepatic bile ducts as above.  Distal colonic diverticulosis without evidence of diverticulitis.  Numerous calcified mesenteric lymph nodes compatible with old granulomatous disease.   Electronically Signed   By: Lavonia Dana M.D.   On: 10/01/2014 16:01   Dg Ercp Biliary & Pancreatic Ducts  10/03/2014   CLINICAL DATA:  Choledocholithiasis.  EXAM: ERCP  TECHNIQUE: Multiple spot images were obtained with the fluoroscopic device and  submitted for interpretation post-procedure.  COMPARISON:  CT abdomen and pelvis 10/01/2014.  FINDINGS: The 4 images submitted demonstrate multiple filling defects within the mildly distended common bile duct consistent with gallstones as noted on CT. A wire is present in the left intrahepatic bile ducts for access. Note is made of calcified lymph nodes in the left upper quadrant the abdomen as noted on CT.  The radiologic technologist documented 10 min 12 sec of fluoroscopy time.  IMPRESSION: Choledocholithiasis.   These images were submitted for radiologic interpretation only. Please see the procedural report for the amount of contrast and the fluoroscopy time utilized.   Electronically Signed   By: Evangeline Dakin M.D.   On: 10/03/2014 13:41   Dg Abd Acute W/chest  10/20/2014   CLINICAL DATA:  Acute onset sweating and abdominal discomfort  EXAM: ACUTE ABDOMEN SERIES (ABDOMEN 2 VIEW & CHEST 1 VIEW)  COMPARISON:  Chest radiograph October 01, 2014 ; abdominal MRI February 19, 2014  FINDINGS: PA chest: No edema or consolidation. Heart size and pulmonary vascularity are normal. No adenopathy. There is lucency in the medial left glenoid region.  Supine and upright abdomen: There is a biliary stent in the right upper quadrant. There is no bowel dilatation or air-fluid level to suggest obstruction. No free air. A small amount of air in the biliary ductal system is consistent with the stent placement. There are scattered calcifications in the abdomen of uncertain etiology. There is moderate stool in the colon.  IMPRESSION: Bowel gas pattern unremarkable. Biliary stent in place in right upper quadrant. Scattered calcifications of uncertain etiology.  The lungs are clear.  Lucent area in the medial left glenoid region of uncertain etiology. Advise dedicated left shoulder radiographs to further assess.   Electronically Signed   By: Lowella Grip M.D.   On: 10/20/2014 10:51   US Abdomen Limited Ruq  10/04/2014   CLINICAL DATA:  Choledocholithiasis.  EXAM: US ABDOMEN LIMITED - RIGHT UPPER QUADRANT  COMPARISON:  ERCP of October 03, 2014; CT scan of October 01, 2014.  FINDINGS: Gallbladder:  Multiple small gallstones are noted as well as sludge within the gallbladder lumen. Mild gallbladder wall thickening is noted measured at 3.3 mm. No sonographic Murphy's sign is noted. No pericholecystic fluid is noted.  Common bile duct:  Diameter: 17.5 mm which is severely dilated. Stones are noted in its distal portion near the pancreatic  head.  Liver:  Probable pneumobilia is noted consistent with recent ERCP. Intrahepatic biliary dilatation is noted. No focal hepatic parenchymal lesion is noted.  IMPRESSION: Cholelithiasis is noted. Intrahepatic and extrahepatic biliary dilatation is noted secondary to distal common bile duct stones.   Electronically Signed   By: Sabino Dick M.D.   On: 10/04/2014 10:49    Microbiology: No results found for this or any previous visit (from the past 240 hour(s)).   Labs: Basic Metabolic Panel:  Recent Labs Lab 10/20/14 0751 10/20/14 1435 10/21/14 0549 10/22/14 0408  NA 137  --  142 142  K 2.8*  --  4.5 4.2  CL 94*  --  107 107  CO2 30  --  26 28  GLUCOSE 116*  --  88 87  BUN 12  --  8 7  CREATININE 0.83 0.71 0.72 0.75  CALCIUM 9.0  --  8.4 8.4  MG  --  1.7  --   --    Liver Function Tests:  Recent Labs Lab 10/20/14 0751 10/21/14 0549 10/22/14 0408  AST 26  25 22  ALT 21 18 19   ALKPHOS 110 80 80  BILITOT 2.0* 0.8 0.5  PROT 7.3 5.5* 5.3*  ALBUMIN 3.5 2.6* 2.6*    Recent Labs Lab 10/20/14 0751  LIPASE 25   No results found for this basename: AMMONIA,  in the last 168 hours CBC:  Recent Labs Lab 10/20/14 0751 10/20/14 1435 10/21/14 0549  WBC 5.4 4.7 2.6*  NEUTROABS 4.7  --   --   HGB 13.6 13.2 11.4*  HCT 39.5 38.5 34.2*  MCV 87.8 90.2 92.2  PLT 186 196 168   Cardiac Enzymes:  Recent Labs Lab 10/20/14 0751  TROPONINI <0.30   BNP: BNP (last 3 results) No results found for this basename: PROBNP,  in the last 8760 hours CBG: No results found for this basename: GLUCAP,  in the last 168 hours     Signed:  [facility]  [facility] 10/22/2014, 10:53 AM

## 2014-10-22 NOTE — Care Management Note (Signed)
    Page 1 of 1   10/22/2014     4:45:12 PM CARE MANAGEMENT NOTE 10/22/2014  Patient:  Paige Levy, Paige Levy   Account Number:  [id number]  Date Initiated:  10/22/2014  Documentation initiated by:  Tomi Bamberger  Subjective/Objective Assessment:   dx epigastric pain  admit- lives with spouse.     Action/Plan:   Anticipated DC Date:  10/22/2014   Anticipated DC Plan:  [place]  CM consult      Choice offered to / List presented to:             Status of service:  Completed, signed off Medicare Important Message given?  YES (If response is "NO", the following Medicare IM given date fields will be blank) Date Medicare IM given:  10/22/2014 Medicare IM given by:  Tomi Bamberger Date Additional Medicare IM given:   Additional Medicare IM given by:    Discharge Disposition:  HOME/SELF CARE  Per UR Regulation:  Reviewed for med. necessity/level of care/duration of stay  If discussed at [place] of Stay Meetings, dates discussed:    Comments:

## 2015-01-12 ENCOUNTER — Other Ambulatory Visit: Payer: Self-pay

## 2015-01-12 ENCOUNTER — Other Ambulatory Visit: Payer: Self-pay | Admitting: Gastroenterology

## 2015-01-13 ENCOUNTER — Encounter (HOSPITAL_COMMUNITY): Payer: Self-pay | Admitting: *Deleted

## 2015-01-13 MED ORDER — SODIUM CHLORIDE 0.9 % IV SOLN: INTRAVENOUS | Status: DC

## 2015-01-13 MED ORDER — DEXTROSE 5 % IV SOLN
1.0000 g | Freq: Once | INTRAVENOUS | Status: AC
  Administered 2015-01-14: 1 g via INTRAVENOUS
  Filled 2015-01-13: qty 1

## 2015-01-14 ENCOUNTER — Ambulatory Visit (HOSPITAL_COMMUNITY)
Admission: RE | Admit: 2015-01-14 | Discharge: 2015-01-14 | Disposition: A | Discharge status: Home / Self Care | Payer: Medicare Other | Source: Ambulatory Visit | Attending: Gastroenterology | Admitting: Gastroenterology

## 2015-01-14 ENCOUNTER — Encounter (HOSPITAL_COMMUNITY)
Admission: RE | Disposition: A | Discharge status: Home / Self Care | Payer: Self-pay | Source: Ambulatory Visit | Attending: Gastroenterology

## 2015-01-14 ENCOUNTER — Ambulatory Visit (HOSPITAL_COMMUNITY): Payer: Medicare Other

## 2015-01-14 ENCOUNTER — Encounter (HOSPITAL_COMMUNITY): Payer: Self-pay | Admitting: Certified Registered Nurse Anesthetist

## 2015-01-14 ENCOUNTER — Ambulatory Visit (HOSPITAL_COMMUNITY): Payer: Medicare Other | Admitting: Certified Registered Nurse Anesthetist

## 2015-01-14 DIAGNOSIS — E039 Hypothyroidism, unspecified: Secondary | ICD-10-CM | POA: Insufficient documentation

## 2015-01-14 DIAGNOSIS — E440 Moderate protein-calorie malnutrition: Secondary | ICD-10-CM | POA: Diagnosis not present

## 2015-01-14 DIAGNOSIS — M1990 Unspecified osteoarthritis, unspecified site: Secondary | ICD-10-CM | POA: Diagnosis not present

## 2015-01-14 DIAGNOSIS — K8050 Calculus of bile duct without cholangitis or cholecystitis without obstruction: Secondary | ICD-10-CM | POA: Diagnosis present

## 2015-01-14 DIAGNOSIS — Z6825 Body mass index (BMI) 25.0-25.9, adult: Secondary | ICD-10-CM | POA: Insufficient documentation

## 2015-01-14 DIAGNOSIS — E785 Hyperlipidemia, unspecified: Secondary | ICD-10-CM | POA: Insufficient documentation

## 2015-01-14 DIAGNOSIS — K831 Obstruction of bile duct: Secondary | ICD-10-CM

## 2015-01-14 DIAGNOSIS — H8109 Meniere's disease, unspecified ear: Secondary | ICD-10-CM | POA: Diagnosis not present

## 2015-01-14 HISTORY — DX: Hyperlipidemia, unspecified: E78.5

## 2015-01-14 HISTORY — PX: SPYGLASS LITHOTRIPSY: SHX5537

## 2015-01-14 HISTORY — PX: ERCP: SHX5425

## 2015-01-14 HISTORY — DX: Hypothyroidism, unspecified: E03.9

## 2015-01-14 LAB — COMPREHENSIVE METABOLIC PANEL
ALT: 19 U/L (ref 0–35)
AST: 27 U/L (ref 0–37)
Albumin: 3.7 g/dL (ref 3.5–5.2)
Alkaline Phosphatase: 62 U/L (ref 39–117)
Anion gap: 9 (ref 5–15)
BILIRUBIN TOTAL: 1.1 mg/dL (ref 0.3–1.2)
BUN: 13 mg/dL (ref 6–23)
CALCIUM: 9.1 mg/dL (ref 8.4–10.5)
CHLORIDE: 97 meq/L (ref 96–112)
CO2: 32 mmol/L (ref 19–32)
CREATININE: 1.01 mg/dL (ref 0.50–1.10)
GFR, EST AFRICAN AMERICAN: 62 mL/min — AB (ref >90)
GFR, EST NON AFRICAN AMERICAN: 53 mL/min — AB (ref >90)
GLUCOSE: 87 mg/dL (ref 70–99)
Potassium: 3 mmol/L — ABNORMAL LOW (ref 3.5–5.1)
SODIUM: 138 mmol/L (ref 135–145)
TOTAL PROTEIN: 6.6 g/dL (ref 6.0–8.3)

## 2015-01-14 LAB — CBC
HCT: 38.1 % (ref 36.0–46.0)
Hemoglobin: 13 g/dL (ref 12.0–15.0)
MCH: 30.7 pg (ref 26.0–34.0)
MCHC: 34.1 g/dL (ref 30.0–36.0)
MCV: 89.9 fL (ref 78.0–100.0)
Platelets: 203 10*3/uL (ref 150–400)
RBC: 4.24 MIL/uL (ref 3.87–5.11)
RDW: 13 % (ref 11.5–15.5)
WBC: 5.3 10*3/uL (ref 4.0–10.5)

## 2015-01-14 LAB — TYPE AND SCREEN
ABO/RH(D): O POS
Antibody Screen: NEGATIVE

## 2015-01-14 LAB — PROTIME-INR
INR: 0.97 (ref 0.00–1.49)
Prothrombin Time: 13 seconds (ref 11.6–15.2)

## 2015-01-14 LAB — ABO/RH: ABO/RH(D): O POS

## 2015-01-14 SURGERY — ERCP, WITH INTERVENTION IF INDICATED
Anesthesia: General

## 2015-01-14 MED ORDER — LACTATED RINGERS IV SOLN: INTRAVENOUS | Status: DC | PRN

## 2015-01-14 MED ORDER — SUCCINYLCHOLINE CHLORIDE 20 MG/ML IJ SOLN
INTRAMUSCULAR | Status: DC | PRN
  Administered 2015-01-14: 100 mg via INTRAVENOUS

## 2015-01-14 MED ORDER — LACTATED RINGERS IV SOLN
INTRAVENOUS | Status: DC
  Administered 2015-01-14: 1000 mL via INTRAVENOUS
  Administered 2015-01-14: 09:00:00 via INTRAVENOUS

## 2015-01-14 MED ORDER — LIDOCAINE HCL (CARDIAC) 20 MG/ML IV SOLN
INTRAVENOUS | Status: DC | PRN
  Administered 2015-01-14: 80 mg via INTRAVENOUS

## 2015-01-14 MED ORDER — FENTANYL CITRATE 0.05 MG/ML IJ SOLN
INTRAMUSCULAR | Status: DC | PRN
  Administered 2015-01-14: 100 ug via INTRAVENOUS

## 2015-01-14 MED ORDER — GLUCAGON HCL RDNA (DIAGNOSTIC) 1 MG IJ SOLR
INTRAMUSCULAR | Status: AC
  Filled 2015-01-14: qty 2

## 2015-01-14 MED ORDER — PHENYLEPHRINE HCL 10 MG/ML IJ SOLN
INTRAMUSCULAR | Status: DC | PRN
  Administered 2015-01-14: 120 ug via INTRAVENOUS
  Administered 2015-01-14 (×3): 80 ug via INTRAVENOUS

## 2015-01-14 MED ORDER — PROPOFOL 10 MG/ML IV BOLUS
INTRAVENOUS | Status: DC | PRN
  Administered 2015-01-14: 140 mg via INTRAVENOUS

## 2015-01-14 MED ORDER — EPHEDRINE SULFATE 50 MG/ML IJ SOLN
INTRAMUSCULAR | Status: DC | PRN
  Administered 2015-01-14: 10 mg via INTRAVENOUS
  Administered 2015-01-14: 15 mg via INTRAVENOUS

## 2015-01-14 MED ORDER — MIDAZOLAM HCL 5 MG/5ML IJ SOLN
INTRAMUSCULAR | Status: DC | PRN
  Administered 2015-01-14: 2 mg via INTRAVENOUS

## 2015-01-14 MED ORDER — ARTIFICIAL TEARS OP OINT
TOPICAL_OINTMENT | OPHTHALMIC | Status: DC | PRN
Start: 2015-01-14 — End: 2015-01-14
  Administered 2015-01-14: 1 via OPHTHALMIC

## 2015-01-14 MED ORDER — ONDANSETRON HCL 4 MG/2ML IJ SOLN
INTRAMUSCULAR | Status: DC | PRN
  Administered 2015-01-14: 4 mg via INTRAVENOUS

## 2015-01-14 MED ORDER — LIDOCAINE HCL 4 % MT SOLN
OROMUCOSAL | Status: DC | PRN
  Administered 2015-01-14: 4 mL via TOPICAL

## 2015-01-14 NOTE — Anesthesia Procedure Notes (Signed)
Procedure Name: Intubation Date/Time: 01/14/2015 8:07 AM Performed by: Ned Grace Pre-anesthesia Checklist: Patient identified, Timeout performed, Emergency Drugs available, Suction available and Patient being monitored Patient Re-evaluated:Patient Re-evaluated prior to inductionOxygen Delivery Method: Circle system utilized Preoxygenation: Pre-oxygenation with 100% oxygen Intubation Type: IV induction and Rapid sequence Laryngoscope Size: Mac and 3 Grade View: Grade I Tube type: Oral Tube size: 7.0 mm Number of attempts: 1 Airway Equipment and Method: Stylet Placement Confirmation: ETT inserted through vocal cords under direct vision,  breath sounds checked- equal and bilateral and positive ETCO2 Secured at: 21 cm Tube secured with: Tape Dental Injury: Teeth and Oropharynx as per pre-operative assessment

## 2015-01-14 NOTE — Op Note (Signed)
[facility] [place], [id number]   ERCP PROCEDURE REPORT  PATIENT: Paige Levy, Paige Levy  MR# :[id number] BIRTHDATE: 04-Sep-1941  GENDER: female ENDOSCOPIST: Clarene Essex, MD REFERRED BY: Florina Ou, M.D. PROCEDURE DATE:  01/14/2015 PROCEDURE:   ERCP with sphincterotomy/papillotomy, ERCP with removal of calculus/calculi , and Endoscopic catheterization of biliary duct  Using spyglass ASA CLASS:    2 INDICATIONS: residual CBD stones MEDICATIONS:  general anesthesia TOPICAL ANESTHETIC:  none  DESCRIPTION OF PROCEDURE:   After the risks benefits and alternatives of the procedure were thoroughly explained, informed consent was obtained.  The Pentax Ercp Scope [id number]  endoscope was introduced through the mouth and advanced to the second portion of the duodenum .the properly placed stent and a normal ampulla with a small to medium periampullary diverticuli was brought into view and the stent was removed with the snare in the customary fashion and then using the JAG Jagwire and the triple-lumen sphincterotome deep selective cannulation was easily obtained of the CBD and multiple stones were confirmed and no pancreatic duct injections or wire advancements were done throughout the procedure and we initially increase the sphincterotomy site until we had adequate biliary drainage and could easily get the fully bowed sphincterotome easily in and out of the duct and we exchanged the sphincterotome for the 12-15 mm adjustable balloon and using the 15 mm balloon multiple balloon pull-through's were done and multiple large and medium stones were removed with minimal resistance and after a few negative balloon pull-through's we proceeded with an occlusion cholangiogram which was normal and then we exchanged the balloon for the spyglass which was done in the customary fashion and an exam to the bifurcation did not reveal any residual stones and the wire and  spyglass was removed and the scope was removed and the patient tolerated the procedure well there was no obvious immediate complications              COMPLICATIONS:   none  ENDOSCOPIC IMPRESSION:1. Normal ampulla and small to medium size periampullary tic 2. Stent removed with snare as above 3. Multiple stones removed using 15 mm balloon and increasing sphincterotomy site 4. Normal spyglass at the end of the procedure as above 5. Adequate biliary drainage at the end of the procedure not mentioned above 6. No pancreatic duct wire advancements or injections  RECOMMENDATIONS:observe for delayed complications if none slowly advance diet later today GI follow-up when necessary and probable surgical consult for cholecystectomy electively     _______________________________ eSigned:  Clarene Essex, MD 01/14/2015 9:06 AM   WH:QPRFF Modena Morrow, MD  PATIENT NAME:  Paige Levy, Paige Levy MR#: [id number]

## 2015-01-14 NOTE — Anesthesia Postprocedure Evaluation (Signed)
  Anesthesia Post-op Note  Patient: Paige Levy  Procedure(s) Performed: Procedure(s) with comments: ENDOSCOPIC RETROGRADE CHOLANGIOPANCREATOGRAPHY (ERCP) (N/A) - need lithotripter/ordered/LH SPYGLASS LITHOTRIPSY (N/A)  Patient Location: PACU and Endoscopy Unit  Anesthesia Type:General  Level of Consciousness: awake  Airway and Oxygen Therapy: Patient Spontanous Breathing  Post-op Pain: mild  Post-op Assessment: Post-op Vital signs reviewed  Post-op Vital Signs: Reviewed  Last Vitals:  Filed Vitals:   01/14/15 0922  BP: 106/40  Pulse: 81  Temp: 36.4 C  Resp: 14    Complications: No apparent anesthesia complications

## 2015-01-14 NOTE — Discharge Instructions (Signed)
May have sips of clear liquids until 1 PM and then may have unlimited clear liquids including broth popsicles Jell-O [place] ices and if doing well at 3 PM may have soft solids and if doing well tomorrow may advance diet and no aspirin or arthritis pills for 3 days and call if any residual GI question or problem and follow-up as needed or when due for screening colonoscopy and otherwise call early next week for a surgical consultation to have your gallbladder removed   Endoscopic Retrograde Cholangiopancreatography (ERCP), Care After Refer to this sheet in the next few weeks. These instructions provide you with information on caring for yourself after your procedure. Your health care provider may also give you more specific instructions. Your treatment has been planned according to current medical practices, but problems sometimes occur. Call your health care provider if you have any problems or questions after your procedure.  WHAT TO EXPECT AFTER THE PROCEDURE  After your procedure, it is typical to feel:   Soreness in your throat.   Sick to your stomach (nauseous).   Bloated.  Dizzy.   Fatigued. HOME CARE INSTRUCTIONS  Have a friend or family member stay with you for the first 24 hours after your procedure.  Start taking your usual medicines and eating normally as soon as you feel well enough to do so or as directed by your health care provider. SEEK MEDICAL CARE IF:  You have abdominal pain.   You develop signs of infection, such as:   Chills.   Feeling unwell.  SEEK IMMEDIATE MEDICAL CARE IF:  You have difficulty swallowing.  You have worsening throat, chest, or abdominal pain.  You vomit.  You have bloody or very black stools.  You have a fever. Document Released: 09/30/2013 Document Reviewed: 09/30/2013 [facility] Patient Information 2015 [place], [place]. This information is not intended to replace advice given to you by your health care provider. Make sure you  discuss any questions you have with your health care provider.

## 2015-01-14 NOTE — Progress Notes (Signed)
[place] 7:57 AM  Subjective: Patient seen and examined and has been tolerating her stent well unfortunately did not tolerate Urso well and her case was discussed with my partner Dr. Amedeo Plenty and her hospital computer chart was reviewed and the case was discussed with the patient and her husband as well  Objective: Vital signs stable afebrile no acute distress exam please see preassessment evaluation ERCP reviewed  Assessment: Residual CBD stones  Plan: Okay to proceed with ERCP and attempt at stent and stone removal with anesthesia assistance and the risks benefits methods of this procedure wasrediscussed  [facility]

## 2015-01-14 NOTE — Transfer of Care (Signed)
Immediate Anesthesia Transfer of Care Note  Patient: Paige Levy  Procedure(s) Performed: Procedure(s) with comments: ENDOSCOPIC RETROGRADE CHOLANGIOPANCREATOGRAPHY (ERCP) (N/A) - need lithotripter/ordered/LH SPYGLASS LITHOTRIPSY (N/A)  Patient Location: PACU  Anesthesia Type:General  Level of Consciousness: awake, alert , oriented and patient cooperative  Airway & Oxygen Therapy: Patient Spontanous Breathing and Patient connected to nasal cannula oxygen  Post-op Assessment: Report given to PACU RN, Post -op Vital signs reviewed and stable and Patient moving all extremities X 4  Post vital signs: Reviewed and stable  Complications: No apparent anesthesia complications

## 2015-01-14 NOTE — Anesthesia Preprocedure Evaluation (Addendum)
Anesthesia Evaluation  Patient identified by MRN, date of birth, ID band Patient awake    Reviewed: Allergy & Precautions, NPO status , Patient's Chart, lab work & pertinent test results  Airway Mallampati: II  TM Distance: >3 FB Neck ROM: Full    Dental  (+) Edentulous Upper, Dental Advisory Given   Pulmonary neg pulmonary ROS,  breath sounds clear to auscultation        Cardiovascular negative cardio ROS  Rate:Normal     Neuro/Psych    GI/Hepatic negative GI ROS, Neg liver ROS,   Endo/Other  Hypothyroidism   Renal/GU negative Renal ROS     Musculoskeletal   Abdominal   Peds  Hematology   Anesthesia Other Findings   Reproductive/Obstetrics                            Anesthesia Physical Anesthesia Plan  ASA: III  Anesthesia Plan: General   Post-op Pain Management:    Induction: Intravenous  Airway Management Planned: Oral ETT  Additional Equipment:   Intra-op Plan:   Post-operative Plan: Extubation in OR  Informed Consent: I have reviewed the patients History and Physical, chart, labs and discussed the procedure including the risks, benefits and alternatives for the proposed anesthesia with the patient or authorized representative who has indicated his/her understanding and acceptance.   Dental advisory given  Plan Discussed with: CRNA and Anesthesiologist  Anesthesia Plan Comments:         Anesthesia Quick Evaluation

## 2015-01-17 ENCOUNTER — Encounter (HOSPITAL_COMMUNITY): Payer: Self-pay | Admitting: Gastroenterology

## 2015-01-17 DIAGNOSIS — H8103 Meniere's disease, bilateral: Secondary | ICD-10-CM | POA: Insufficient documentation

## 2015-01-31 ENCOUNTER — Other Ambulatory Visit: Payer: Self-pay | Admitting: General Surgery

## 2015-06-20 ENCOUNTER — Other Ambulatory Visit: Payer: Self-pay

## 2016-04-27 DIAGNOSIS — H9190 Unspecified hearing loss, unspecified ear: Secondary | ICD-10-CM | POA: Insufficient documentation

## 2016-04-27 DIAGNOSIS — J310 Chronic rhinitis: Secondary | ICD-10-CM | POA: Insufficient documentation

## 2019-11-18 DIAGNOSIS — R590 Localized enlarged lymph nodes: Secondary | ICD-10-CM | POA: Insufficient documentation

## 2020-01-01 DIAGNOSIS — H93A1 Pulsatile tinnitus, right ear: Secondary | ICD-10-CM | POA: Insufficient documentation

## 2020-08-03 ENCOUNTER — Encounter: Payer: Self-pay | Admitting: Family Medicine

## 2020-08-03 ENCOUNTER — Other Ambulatory Visit: Payer: Self-pay

## 2020-08-03 ENCOUNTER — Ambulatory Visit (INDEPENDENT_AMBULATORY_CARE_PROVIDER_SITE_OTHER): Payer: Medicare Other | Admitting: Family Medicine

## 2020-08-03 VITALS — BP 112/60 | HR 80 | Temp 97.7°F | Ht 65.0 in | Wt 157.4 lb

## 2020-08-03 DIAGNOSIS — H8103 Meniere's disease, bilateral: Secondary | ICD-10-CM

## 2020-08-03 DIAGNOSIS — E038 Other specified hypothyroidism: Secondary | ICD-10-CM | POA: Diagnosis not present

## 2020-08-03 DIAGNOSIS — E785 Hyperlipidemia, unspecified: Secondary | ICD-10-CM | POA: Diagnosis not present

## 2020-08-03 MED ORDER — TRIAMTERENE-HCTZ 75-50 MG PO TABS: 1.0000 | ORAL_TABLET | Freq: Every day | ORAL | 1 refills | Status: DC

## 2020-08-03 MED ORDER — LEVOTHYROXINE SODIUM 88 MCG PO TABS: 88.0000 ug | ORAL_TABLET | Freq: Every day | ORAL | 1 refills | Status: DC

## 2020-08-03 NOTE — Progress Notes (Signed)
Patient: Paige Levy MRN: [id number] DOB: [birth year] PCP: Florina Ou, MD (Inactive)     Subjective:  Chief Complaint  Patient presents with  . Establish Care  . Hypothyroidism  . Hyperlipidemia  . menieres disease    HPI: The patient is a 79 y.o. female who presents today to establish care. Pt has no concerns today.  Hyperlipidemia Overall quite healthy. She has never smoked or had diabetes. No hx of HTN or heart disease. She has no family history of CAD. She was on a small statin and then stopped this about 18 months ago.  Hypothyroidism Diagnosed about 20 years ago. Has been on medication since that time. No hx of nodules/biopsies.   menieres disease On high dose maxzide and feels like she is well controlled on this. No serious symptoms in > 10 years. Was started on this by physician in [place].   Hm reviewed.   Review of Systems  Constitutional: Negative for chills, fatigue and fever.  HENT: Negative for dental problem, ear pain, hearing loss, sore throat and trouble swallowing.   Eyes: Negative for visual disturbance.  Respiratory: Negative for cough, chest tightness, shortness of breath and wheezing.   Cardiovascular: Negative for chest pain, palpitations and leg swelling.  Gastrointestinal: Negative for abdominal pain, blood in stool, diarrhea and nausea.  Endocrine: Negative for cold intolerance, polydipsia, polyphagia and polyuria.  Genitourinary: Negative for dysuria, frequency, hematuria, pelvic pain and urgency.  Musculoskeletal: Negative for arthralgias.  Skin: Negative for rash.  Neurological: Negative for dizziness, light-headedness and headaches.  Psychiatric/Behavioral: Negative for dysphoric mood and sleep disturbance. The patient is not nervous/anxious.     Allergies Patient is allergic to ursodiol.  Past Medical History Patient  has a past medical history of Arthritis, Hyperlipemia, Hypothyroidism, Meniere's disease, and Thyroid  disease.  Surgical History Patient  has a past surgical history that includes Abdominal hysterectomy; ERCP (Left, 10/03/2014); Sphincterotomy (09/2014); ERCP (N/A, 01/14/2015); and spyglass lithotripsy (N/A, 01/14/2015).  Family History Pateint's family history includes Heart attack in her mother; Other in her father and mother.  Social History Patient  reports that she has never smoked. She has never used smokeless tobacco. She reports that she does not drink alcohol and does not use drugs.    Objective: Vitals:   08/03/20 0922  BP: 112/60  Pulse: 80  Temp: 97.7 F (36.5 C)  TempSrc: Temporal  SpO2: 90%  Weight: 157 lb 6.4 oz (71.4 kg)  Height: 5\' 5"  (1.651 m)    Body mass index is 26.19 kg/m.  Physical Exam Vitals reviewed.  Constitutional:      Appearance: Normal appearance. She is well-developed and normal weight.  HENT:     Head: Normocephalic and atraumatic.     Right Ear: Tympanic membrane, ear canal and external ear normal.     Left Ear: Tympanic membrane, ear canal and external ear normal.     Nose: Nose normal.     Mouth/Throat:     Mouth: Mucous membranes are moist.  Eyes:     Extraocular Movements: Extraocular movements intact.     Conjunctiva/sclera: Conjunctivae normal.     Pupils: Pupils are equal, round, and reactive to light.  Neck:     Thyroid: No thyromegaly.     Comments: No thyromegaly  Cardiovascular:     Rate and Rhythm: Normal rate and regular rhythm.     Pulses: Normal pulses.     Heart sounds: Normal heart sounds. No murmur heard.   Pulmonary:  Effort: Pulmonary effort is normal.     Breath sounds: Normal breath sounds.  Abdominal:     General: Abdomen is flat. Bowel sounds are normal. There is no distension.     Palpations: Abdomen is soft.     Tenderness: There is no abdominal tenderness.  Musculoskeletal:     Cervical back: Normal range of motion and neck supple.  Lymphadenopathy:     Cervical: No cervical adenopathy.  Skin:     General: Skin is warm and dry.     Capillary Refill: Capillary refill takes less than 2 seconds.     Findings: No rash.  Neurological:     General: No focal deficit present.     Mental Status: She is alert and oriented to person, place, and time.     Cranial Nerves: No cranial nerve deficit.     Coordination: Coordination normal.     Deep Tendon Reflexes: Reflexes normal.  Psychiatric:        Mood and Affect: Mood normal.        Behavior: Behavior normal.        Office Visit from 08/03/2020 in [facility]  PHQ-2 Total Score 0         Assessment/plan: 1. Other specified hypothyroidism Checking labs, refills given. Appears euthyroid.  - T4, free; Future - TSH; Future - TSH - T4, free  2. Dyslipidemia Checking labs. She is 78 so am okay with her staying off statin.  - CBC with Differential/Platelet; Future - Comprehensive metabolic panel; Future - Lipid panel; Future - Lipid panel - Comprehensive metabolic panel - CBC with Differential/Platelet  3. Meniere's disease of both ears Well controlled on medication. Refills given. Checking labs.   HM reviewed. Requesting records.    This visit occurred during the SARS-CoV-2 public health emergency.  Safety protocols were in place, including screening questions prior to the visit, additional usage of staff PPE, and extensive cleaning of exam room while observing appropriate contact time as indicated for disinfecting solutions.    Return in about 6 months (around 02/03/2021) for routine check up .   Orma Flaming, MD [place]   08/03/2020

## 2020-08-03 NOTE — Patient Instructions (Signed)
-  stop neomycin. Can have reaction/dermatitis to this cream. Get polysporin instead if you need something.   -routine labs today. Refilled your medication.   So nice to meet you! You are such a joy!  See you in 6 months!  Dr. Rogers Blocker

## 2020-08-04 LAB — COMPREHENSIVE METABOLIC PANEL
AG Ratio: 1.6 (calc) (ref 1.0–2.5)
ALT: 13 U/L (ref 6–29)
AST: 21 U/L (ref 10–35)
Albumin: 4.2 g/dL (ref 3.6–5.1)
Alkaline phosphatase (APISO): 61 U/L (ref 37–153)
BUN: 12 mg/dL (ref 7–25)
CO2: 31 mmol/L (ref 20–32)
Calcium: 9.9 mg/dL (ref 8.6–10.4)
Chloride: 100 mmol/L (ref 98–110)
Creat: 0.8 mg/dL (ref 0.60–0.88)
Globulin: 2.6 g/dL (calc) (ref 1.9–3.7)
Glucose, Bld: 86 mg/dL (ref 65–99)
Potassium: 3.6 mmol/L (ref 3.5–5.3)
Sodium: 138 mmol/L (ref 135–146)
Total Bilirubin: 1.2 mg/dL (ref 0.2–1.2)
Total Protein: 6.8 g/dL (ref 6.1–8.1)

## 2020-08-04 LAB — CBC WITH DIFFERENTIAL/PLATELET
Absolute Monocytes: 407 cells/uL (ref 200–950)
Basophils Absolute: 39 cells/uL (ref 0–200)
Basophils Relative: 0.7 %
Eosinophils Absolute: 61 cells/uL (ref 15–500)
Eosinophils Relative: 1.1 %
HCT: 42.7 % (ref 35.0–45.0)
Hemoglobin: 14.4 g/dL (ref 11.7–15.5)
Lymphs Abs: 1590 cells/uL (ref 850–3900)
MCH: 31.2 pg (ref 27.0–33.0)
MCHC: 33.7 g/dL (ref 32.0–36.0)
MCV: 92.6 fL (ref 80.0–100.0)
MPV: 11.1 fL (ref 7.5–12.5)
Monocytes Relative: 7.4 %
Neutro Abs: 3405 cells/uL (ref 1500–7800)
Neutrophils Relative %: 61.9 %
Platelets: 250 10*3/uL (ref 140–400)
RBC: 4.61 10*6/uL (ref 3.80–5.10)
RDW: 12.4 % (ref 11.0–15.0)
Total Lymphocyte: 28.9 %
WBC: 5.5 10*3/uL (ref 3.8–10.8)

## 2020-08-04 LAB — LIPID PANEL
Cholesterol: 284 mg/dL — ABNORMAL HIGH (ref <200)
HDL: 56 mg/dL (ref >=50)
LDL Cholesterol (Calc): 195 mg/dL (calc) — ABNORMAL HIGH
Non-HDL Cholesterol (Calc): 228 mg/dL (calc) — ABNORMAL HIGH (ref <130)
Total CHOL/HDL Ratio: 5.1 (calc) — ABNORMAL HIGH (ref <5.0)
Triglycerides: 167 mg/dL — ABNORMAL HIGH (ref <150)

## 2020-08-04 LAB — TSH: TSH: 3.02 mIU/L (ref 0.40–4.50)

## 2020-08-04 LAB — T4, FREE: Free T4: 1.6 ng/dL (ref 0.8–1.8)

## 2020-08-15 ENCOUNTER — Telehealth: Payer: Self-pay

## 2020-08-15 NOTE — Telephone Encounter (Signed)
Patient called back and stated she did not need the medication refilled.

## 2020-08-15 NOTE — Telephone Encounter (Signed)
Relayed lab notes to Pt. She has been taking her husband's cholesterol meds , since she got the message that she needed to be taking something. She ask that meds be sent in for her

## 2020-12-01 ENCOUNTER — Ambulatory Visit: Payer: Medicare Other

## 2020-12-12 ENCOUNTER — Other Ambulatory Visit: Payer: Self-pay

## 2020-12-12 ENCOUNTER — Encounter: Payer: Self-pay | Admitting: Family Medicine

## 2020-12-12 ENCOUNTER — Ambulatory Visit (INDEPENDENT_AMBULATORY_CARE_PROVIDER_SITE_OTHER): Payer: Medicare Other | Admitting: Family Medicine

## 2020-12-12 VITALS — BP 110/72 | HR 87 | Temp 97.2°F | Ht 65.0 in | Wt 156.4 lb

## 2020-12-12 DIAGNOSIS — A084 Viral intestinal infection, unspecified: Secondary | ICD-10-CM

## 2020-12-12 NOTE — Progress Notes (Signed)
Patient: Paige Levy MRN: [id number] DOB: 1941-08-06 PCP: Orma Flaming, MD     Subjective:  Chief Complaint  Patient presents with  . Abdominal Pain    Since Thursday or Friday.   . Diarrhea    HPI: The patient is a 79 y.o. female who presents today for abdominal pain and diarrhea since last week. She states she doesn't feel good and doesn't have much energy. She has been having diarrhea after she eats. She has no blood in her stool. When asked about stomach pain, she states it doesn't really hurt she is just aware of the pain. Area of discomfort is along her entire lower abdomen. Pain is not worse with food. When asked how many episodes of diarrhea she is having she states maybe 3-4 times a day. She states it loose. She isn't eating much food. She is drinking lots of water. She denies any sick contacts. No fever/chills. No nausea/vomiting. No travel. No covid booster shot recently, but has had her other 2 covid vaccines. She has had her flu shot this year. She has not lost any weight. She is one pound down from when I saw her in august. She has not been on any new antibiotics, over the counter medication. Nothing has changed. Denies any undercooked foods.   Review of Systems  Constitutional: Positive for fatigue. Negative for chills and fever.  Respiratory: Negative for cough and shortness of breath.   Cardiovascular: Negative for chest pain, palpitations and leg swelling.  Gastrointestinal: Positive for abdominal pain and diarrhea. Negative for abdominal distention, blood in stool, constipation, nausea and vomiting.  Genitourinary: Negative for dysuria.  Musculoskeletal: Negative for arthralgias.  Skin: Negative for rash.    Allergies Patient is allergic to ursodiol.  Past Medical History Patient  has a past medical history of Arthritis, Hyperlipemia, Hypothyroidism, Meniere's disease, and Thyroid disease.  Surgical History Patient  has a past surgical history that includes  Abdominal hysterectomy; ERCP (Left, 10/03/2014); Sphincterotomy (09/2014); ERCP (N/A, 01/14/2015); and spyglass lithotripsy (N/A, 01/14/2015).  Family History Pateint's family history includes Heart attack in her mother; Other in her father and mother.  Social History Patient  reports that she has never smoked. She has never used smokeless tobacco. She reports that she does not drink alcohol and does not use drugs.    Objective: Vitals:   12/12/20 1305  BP: 110/72  Pulse: 87  Temp: (!) 97.2 F (36.2 C)  TempSrc: Temporal  SpO2: 98%  Weight: 156 lb 6.4 oz (70.9 kg)  Height: 5\' 5"  (1.651 m)    Body mass index is 26.03 kg/m.  Physical Exam Vitals reviewed.  Constitutional:      General: She is not in acute distress.    Appearance: She is well-developed and normal weight. She is not ill-appearing.     Comments: Hard of hearing  HENT:     Head: Normocephalic and atraumatic.  Cardiovascular:     Rate and Rhythm: Normal rate and regular rhythm.     Heart sounds: Normal heart sounds.  Pulmonary:     Effort: Pulmonary effort is normal.     Breath sounds: Normal breath sounds.  Abdominal:     General: Abdomen is flat. Bowel sounds are increased.     Palpations: Abdomen is soft.     Tenderness: There is no abdominal tenderness. There is no guarding or rebound. Negative signs include Rovsing's sign.     Hernia: No hernia is present.  Skin:    General: Skin is warm.  Capillary Refill: Capillary refill takes less than 2 seconds.  Neurological:     General: No focal deficit present.     Mental Status: She is alert and oriented to person, place, and time.  Psychiatric:        Mood and Affect: Mood normal.        Behavior: Behavior normal.        Assessment/plan: 1. Viral gastroenteritis No red flags on exam and short history of a few episodes of diarrhea.  Treat conservatively with BRAT diet and fluids. Checking labs as well. Strict precautions given and will return for  stool studies if diarrhea persists past 7 days. Precautions given for ER if fever/worsening pain, blood in stool.  - CBC with Differential/Platelet; Future - COMPLETE METABOLIC PANEL WITH GFR; Future - TSH; Future   This visit occurred during the SARS-CoV-2 public health emergency.  Safety protocols were in place, including screening questions prior to the visit, additional usage of staff PPE, and extensive cleaning of exam room while observing appropriate contact time as indicated for disinfecting solutions.     Return if symptoms worsen or fail to improve.     Orma Flaming, MD [place]  12/12/2020

## 2020-12-12 NOTE — Patient Instructions (Signed)
BRAT diet: bananas, rice, applesauce and toast are helpful.   IF diarrhea not better by 7 days I need you to let me know so I can check stool studies. Please call me!    Viral Gastroenteritis, Adult  Viral gastroenteritis is also known as the stomach flu. This condition may affect your stomach, your small intestine, and your large intestine. It can cause sudden watery poop (diarrhea), fever, and throwing up (vomiting). This condition is caused by certain germs (viruses). These germs can be passed from person to person very easily (are contagious). Having watery poop and throwing up can make you feel weak and cause you to not have enough water in your body (get dehydrated). This can make you tired and thirsty, make you have a dry mouth, and make it so you pee (urinate) less often. It is important to replace the fluids that you lose from having watery poop and throwing up. What are the causes?  You can get sick by catching viruses from other people.  You can also get sick by: ? Eating food, drinking water, or touching a surface that has the viruses on it (is contaminated). ? Sharing utensils or other personal items with a person who is sick. What increases the risk?  Having a weak body defense system (immune system).  Living with one or more children who are younger than 89 years old.  Living in a nursing home.  Going on cruise ships. What are the signs or symptoms? Symptoms of this condition start suddenly. Symptoms may last for a few days or for as long as a week.  Common symptoms include: ? Watery poop. ? Throwing up.  Other symptoms include: ? Fever. ? Headache. ? Feeling tired (fatigue). ? Pain in the belly (abdomen). ? Chills. ? Feeling weak. ? Feeling sick to your stomach (nauseous). ? Muscle aches. ? Not feeling hungry. How is this treated?  This condition typically goes away on its own.  The focus of treatment is to replace the fluids that you lose. This condition  may be treated with: ? An ORS (oral rehydration solution). This is a drink that is sold at pharmacies and stores. ? Medicines to help with your symptoms. ? Probiotic supplements to reduce symptoms of diarrhea. ? Fluids given through an IV tube, if needed.  Older adults and people with other diseases or a weak body defense system are at higher risk for not having enough water in the body. Follow these instructions at home: Eating and drinking   Take an ORS as told by your doctor.  Drink clear fluids in small amounts as you are able. Clear fluids include: ? Water. ? Ice chips. ? Fruit juice with water added to it (diluted). ? Low-calorie sports drinks.  Drink enough fluid to keep your pee (urine) pale yellow.  Eat small amounts of healthy foods every 3-4 hours as you are able. This may include whole grains, fruits, vegetables, lean meats, and yogurt.  Avoid fluids that have a lot of sugar or caffeine in them, such as energy drinks, sports drinks, and soda.  Avoid spicy or fatty foods.  Avoid alcohol. General instructions   Wash your hands often. This is very important after you have watery poop or you throw up. If you cannot use soap and water, use hand sanitizer.  Make sure that all people in your home wash their hands well and often.  Take over-the-counter and prescription medicines only as told by your doctor.  Rest at home  while you get better.  Watch your condition for any changes.  Take a warm bath to help with any burning or pain from having watery poop.  Keep all follow-up visits as told by your doctor. This is important. Contact a doctor if:  You cannot keep fluids down.  Your symptoms get worse.  You have new symptoms.  You feel light-headed.  You feel dizzy.  You have muscle cramps. Get help right away if:  You have chest pain.  You feel very weak.  You pass out (faint).  You see blood in your throw-up.  Your throw-up looks like coffee  grounds.  You have bloody or black poop (stools) or poop that looks like tar.  You have a very bad headache, or a stiff neck, or both.  You have a rash.  You have very bad pain, cramping, or bloating in your belly.  You have trouble breathing.  You are breathing very quickly.  You have a fast heartbeat.  Your skin feels cold and clammy.  You feel mixed up (confused).  You have pain when you pee.  You have signs of not having enough water in the body, such as: ? Dark pee, hardly any pee, or no pee. ? Cracked lips. ? Dry mouth. ? Sunken eyes. ? Feeling very sleepy. ? Feeling weak. Summary  Viral gastroenteritis is also known as the stomach flu.  This condition can cause sudden watery poop (diarrhea), fever, and throwing up (vomiting).  These germs can be passed from person to person very easily.  Take an ORS as told by your doctor. This is a drink that is sold at pharmacies and stores.  Drink fluids in small amounts many times each day as you are able. This information is not intended to replace advice given to you by your health care provider. Make sure you discuss any questions you have with your health care provider. Document Revised: 10/15/2018 Document Reviewed: 10/15/2018 Elsevier Patient Education  2020 [place] Choices to Help Relieve Diarrhea, Adult When you have diarrhea, the foods you eat and your eating habits are very important. Choosing the right foods and drinks can help:  Relieve diarrhea.  Replace lost fluids and nutrients.  Prevent dehydration. What general guidelines should I follow?  Relieving diarrhea  Choose foods with less than 2 g or .07 oz. of fiber per serving.  Limit fats to less than 8 tsp (38 g or 1.34 oz.) a day.  Avoid the following: ? Foods and beverages sweetened with high-fructose corn syrup, honey, or sugar alcohols such as xylitol, sorbitol, and mannitol. ? Foods that contain a lot of fat or sugar. ? Fried,  greasy, or spicy foods. ? High-fiber grains, breads, and cereals. ? Raw fruits and vegetables.  Eat foods that are rich in probiotics. These foods include dairy products such as yogurt and fermented milk products. They help increase healthy bacteria in the stomach and intestines (gastrointestinal tract, or GI tract).  If you have lactose intolerance, avoid dairy products. These may make your diarrhea worse.  Take medicine to help stop diarrhea (antidiarrheal medicine) only as told by your health care provider. Replacing nutrients  Eat small meals or snacks every 3-4 hours.  Eat bland foods, such as white rice, toast, or baked potato, until your diarrhea starts to get better. Gradually reintroduce nutrient-rich foods as tolerated or as told by your health care provider. This includes: ? Well-cooked protein foods. ? Peeled, seeded, and soft-cooked fruits and vegetables. ?  Low-fat dairy products.  Take vitamin and mineral supplements as told by your health care provider. Preventing dehydration  Start by sipping water or a special solution to prevent dehydration (oral rehydration solution, ORS). Urine that is clear or pale yellow means that you are getting enough fluid.  Try to drink at least 8-10 cups of fluid each day to help replace lost fluids.  You may add other liquids in addition to water, such as clear juice or decaffeinated sports drinks, as tolerated or as told by your health care provider.  Avoid drinks with caffeine, such as coffee, tea, or soft drinks.  Avoid alcohol. What foods are recommended?     The items listed may not be a complete list. Talk with your health care provider about what dietary choices are best for you. Grains White rice. White, [place], or pita breads (fresh or toasted), including plain rolls, buns, or bagels. White pasta. Saltine, soda, or graham crackers. Pretzels. Low-fiber cereal. Cooked cereals made with water (such as cornmeal, farina, or cream  cereals). Plain muffins. Matzo. Melba toast. Zwieback. Vegetables Potatoes (without the skin). Most well-cooked and canned vegetables without skins or seeds. Tender lettuce. Fruits Apple sauce. Fruits canned in juice. Cooked apricots, cherries, grapefruit, peaches, pears, or plums. Fresh bananas and cantaloupe. Meats and other protein foods Baked or boiled chicken. Eggs. Tofu. Fish. Seafood. Smooth nut butters. Ground or well-cooked tender beef, ham, veal, lamb, pork, or poultry. Dairy Plain yogurt, kefir, and unsweetened liquid yogurt. Lactose-free milk, buttermilk, skim milk, or soy milk. Low-fat or nonfat hard cheese. Beverages Water. Low-calorie sports drinks. Fruit juices without pulp. Strained tomato and vegetable juices. Decaffeinated teas. Sugar-free beverages not sweetened with sugar alcohols. Oral rehydration solutions, if approved by your health care provider. Seasoning and other foods Bouillon, broth, or soups made from recommended foods. What foods are not recommended? The items listed may not be a complete list. Talk with your health care provider about what dietary choices are best for you. Grains Whole grain, whole wheat, bran, or rye breads, rolls, pastas, and crackers. Wild or brown rice. Whole grain or bran cereals. Barley. Oats and oatmeal. Corn tortillas or taco shells. Granola. Popcorn. Vegetables Raw vegetables. Fried vegetables. Cabbage, broccoli, Brussels sprouts, artichokes, baked beans, beet greens, corn, kale, legumes, peas, sweet potatoes, and yams. Potato skins. Cooked spinach and cabbage. Fruits Dried fruit, including raisins and dates. Raw fruits. Stewed or dried prunes. Canned fruits with syrup. Meat and other protein foods Fried or fatty meats. Deli meats. Chunky nut butters. Nuts and seeds. Beans and lentils. Berniece Salines. Hot dogs. Sausage. Dairy High-fat cheeses. Whole milk, chocolate milk, and beverages made with milk, such as milk shakes. Half-and-half. Cream.  sour cream. Ice cream. Beverages Caffeinated beverages (such as coffee, tea, soda, or energy drinks). Alcoholic beverages. Fruit juices with pulp. Prune juice. Soft drinks sweetened with high-fructose corn syrup or sugar alcohols. High-calorie sports drinks. Fats and oils Butter. Cream sauces. Margarine. Salad oils. Plain salad dressings. Olives. Avocados. Mayonnaise. Sweets and desserts Sweet rolls, doughnuts, and sweet breads. Sugar-free desserts sweetened with sugar alcohols such as xylitol and sorbitol. Seasoning and other foods Honey. Hot sauce. Chili powder. Gravy. Cream-based or milk-based soups. Pancakes and waffles. Summary  When you have diarrhea, the foods you eat and your eating habits are very important.  Make sure you get at least 8-10 cups of fluid each day, or enough to keep your urine clear or pale yellow.  Eat bland foods and gradually reintroduce healthy, nutrient-rich foods  as tolerated, or as told by your health care provider.  Avoid high-fiber, fried, greasy, or spicy foods. This information is not intended to replace advice given to you by your health care provider. Make sure you discuss any questions you have with your health care provider. Document Revised: 04/02/2019 Document Reviewed: 12/07/2016 Elsevier Patient Education  [place].

## 2020-12-27 ENCOUNTER — Telehealth: Payer: Self-pay

## 2020-12-27 ENCOUNTER — Other Ambulatory Visit: Payer: Self-pay | Admitting: Family Medicine

## 2020-12-27 MED ORDER — ROSUVASTATIN CALCIUM 10 MG PO TABS: 10.0000 mg | ORAL_TABLET | Freq: Every day | ORAL | 3 refills | Status: DC

## 2020-12-27 NOTE — Telephone Encounter (Signed)
Pt husband called stating pt was told by Dr. Artis Flock that she was going to be put back on atorvastatin. Husband was calling to see if script could be sent in. Please advise.

## 2020-12-27 NOTE — Telephone Encounter (Signed)
Attempted to give pt message below. I had to lvm for pt and pt's husband to call the office back.

## 2020-12-27 NOTE — Telephone Encounter (Signed)
Let them know we have reached out 3 times to give lab results and have not hear back!  I sent in a different cholesterol drug called crestor. She will take it once a day. Please let me now if too expensive or if any side effects.  Dr. Artis Flock

## 2020-12-28 NOTE — Telephone Encounter (Signed)
I spoke with the pt's husband to give him message below. He voiced understanding, and says that he will pick up medication and follow up as needed.

## 2021-01-17 ENCOUNTER — Other Ambulatory Visit: Payer: Self-pay | Admitting: Family Medicine

## 2021-02-08 ENCOUNTER — Other Ambulatory Visit: Payer: Self-pay

## 2021-02-08 ENCOUNTER — Encounter: Payer: Self-pay | Admitting: Family Medicine

## 2021-02-08 ENCOUNTER — Other Ambulatory Visit: Payer: Self-pay | Admitting: Family Medicine

## 2021-02-08 ENCOUNTER — Ambulatory Visit (INDEPENDENT_AMBULATORY_CARE_PROVIDER_SITE_OTHER): Payer: Medicare Other | Admitting: Family Medicine

## 2021-02-08 VITALS — BP 110/80 | HR 94 | Temp 97.0°F | Ht 65.0 in | Wt 158.0 lb

## 2021-02-08 DIAGNOSIS — E038 Other specified hypothyroidism: Secondary | ICD-10-CM | POA: Diagnosis not present

## 2021-02-08 DIAGNOSIS — I4891 Unspecified atrial fibrillation: Secondary | ICD-10-CM

## 2021-02-08 DIAGNOSIS — E785 Hyperlipidemia, unspecified: Secondary | ICD-10-CM

## 2021-02-08 LAB — COMPREHENSIVE METABOLIC PANEL
ALT: 14 U/L (ref 0–35)
AST: 18 U/L (ref 0–37)
Albumin: 4 g/dL (ref 3.5–5.2)
Alkaline Phosphatase: 57 U/L (ref 39–117)
BUN: 14 mg/dL (ref 6–23)
CO2: 29 mEq/L (ref 19–32)
Calcium: 9.6 mg/dL (ref 8.4–10.5)
Chloride: 99 mEq/L (ref 96–112)
Creatinine, Ser: 0.88 mg/dL (ref 0.40–1.20)
GFR: 61.65 mL/min (ref >60.00)
Glucose, Bld: 107 mg/dL — ABNORMAL HIGH (ref 70–99)
Potassium: 3.3 mEq/L — ABNORMAL LOW (ref 3.5–5.1)
Sodium: 138 mEq/L (ref 135–145)
Total Bilirubin: 1.6 mg/dL — ABNORMAL HIGH (ref 0.2–1.2)
Total Protein: 7.3 g/dL (ref 6.0–8.3)

## 2021-02-08 LAB — CBC WITH DIFFERENTIAL/PLATELET
Basophils Absolute: 0 10*3/uL (ref 0.0–0.1)
Basophils Relative: 0.6 % (ref 0.0–3.0)
Eosinophils Absolute: 0.1 10*3/uL (ref 0.0–0.7)
Eosinophils Relative: 0.9 % (ref 0.0–5.0)
HCT: 41.2 % (ref 36.0–46.0)
Hemoglobin: 13.8 g/dL (ref 12.0–15.0)
Lymphocytes Relative: 21.5 % (ref 12.0–46.0)
Lymphs Abs: 1.5 10*3/uL (ref 0.7–4.0)
MCHC: 33.5 g/dL (ref 30.0–36.0)
MCV: 92.2 fl (ref 78.0–100.0)
Monocytes Absolute: 0.5 10*3/uL (ref 0.1–1.0)
Monocytes Relative: 6.9 % (ref 3.0–12.0)
Neutro Abs: 5 10*3/uL (ref 1.4–7.7)
Neutrophils Relative %: 70.1 % (ref 43.0–77.0)
Platelets: 238 10*3/uL (ref 150.0–400.0)
RBC: 4.47 Mil/uL (ref 3.87–5.11)
RDW: 13.3 % (ref 11.5–15.5)
WBC: 7.1 10*3/uL (ref 4.0–10.5)

## 2021-02-08 LAB — T4, FREE: Free T4: 1.33 ng/dL (ref 0.60–1.60)

## 2021-02-08 LAB — LIPID PANEL
Cholesterol: 151 mg/dL (ref 0–200)
HDL: 57.5 mg/dL (ref >39.00)
LDL Cholesterol: 72 mg/dL (ref 0–99)
NonHDL: 93.38
Total CHOL/HDL Ratio: 3
Triglycerides: 108 mg/dL (ref 0.0–149.0)
VLDL: 21.6 mg/dL (ref 0.0–40.0)

## 2021-02-08 LAB — TSH: TSH: 0.86 u[IU]/mL (ref 0.35–4.50)

## 2021-02-08 MED ORDER — METOPROLOL SUCCINATE ER 25 MG PO TB24: 12.5000 mg | ORAL_TABLET | Freq: Every day | ORAL | 3 refills | Status: DC

## 2021-02-08 MED ORDER — APIXABAN 2.5 MG PO TABS: 2.5000 mg | ORAL_TABLET | Freq: Two times a day (BID) | ORAL | 3 refills | Status: DC

## 2021-02-08 MED ORDER — POTASSIUM CHLORIDE ER 10 MEQ PO TBCR: 10.0000 meq | EXTENDED_RELEASE_TABLET | Freq: Every day | ORAL | 0 refills | Status: DC

## 2021-02-08 NOTE — Patient Instructions (Signed)
Checking labs 1) starting you on 2 new medications. One is called metoprolol to help slow down the heart. You will take this once a day. The other is called eliquis. This helps thin the blood in a sense and reduce risk of a clot which is what we worry about with atrial fibrillation. You take this twice a day. I have done a lower dose due to your age.  3) referred you to cardiology as well. They will call you with this appointment.    Atrial Fibrillation  Atrial fibrillation is a type of heartbeat that is irregular or fast. If you have this condition, your heart beats without any order. This makes it hard for your heart to pump blood in a normal way. Atrial fibrillation may come and go, or it may become a long-lasting problem. If this condition is not treated, it can put you at higher risk for stroke, heart failure, and other heart problems. What are the causes? This condition may be caused by diseases that damage the heart. They include:  High blood pressure.  Heart failure.  Heart valve disease.  Heart surgery. Other causes include:  Diabetes.  Thyroid disease.  Being overweight.  Kidney disease. Sometimes the cause is not known. What increases the risk? You are more likely to develop this condition if:  You are older.  You smoke.  You exercise often and very hard.  You have a family history of this condition.  You are a man.  You use drugs.  You drink a lot of alcohol.  You have lung conditions, such as emphysema, pneumonia, or COPD.  You have sleep apnea. What are the signs or symptoms? Common symptoms of this condition include:  A feeling that your heart is beating very fast.  Chest pain or discomfort.  Feeling short of breath.  Suddenly feeling light-headed or weak.  Getting tired easily during activity.  Fainting.  Sweating. In some cases, there are no symptoms. How is this treated? Treatment for this condition depends on underlying conditions  and how you feel when you have atrial fibrillation. They include:  Medicines to: ? Prevent blood clots. ? Treat heart rate or heart rhythm problems.  Using devices, such as a pacemaker, to correct heart rhythm problems.  Doing surgery to remove the part of the heart that sends bad signals.  Closing an area where clots can form in the heart (left atrial appendage). In some cases, your doctor will treat other underlying conditions. Follow these instructions at home: Medicines  Take over-the-counter and prescription medicines only as told by your doctor.  Do not take any new medicines without first talking to your doctor.  If you are taking blood thinners: ? Talk with your doctor before you take any medicines that have aspirin or NSAIDs, such as ibuprofen, in them. ? Take your medicine exactly as told by your doctor. Take it at the same time each day. ? Avoid activities that could hurt or bruise you. Follow instructions about how to prevent falls. ? Wear a bracelet that says you are taking blood thinners. Or, carry a card that lists what medicines you take. Lifestyle  Do not use any products that have nicotine or tobacco in them. These include cigarettes, e-cigarettes, and chewing tobacco. If you need help quitting, ask your doctor.  Eat heart-healthy foods. Talk with your doctor about the right eating plan for you.  Exercise regularly as told by your doctor.  Do not drink alcohol.  Lose weight if you  are overweight.  Do not use drugs, including cannabis.      General instructions  If you have a condition that causes breathing to stop for a short period of time (apnea), treat it as told by your doctor.  Keep a healthy weight. Do not use diet pills unless your doctor says they are safe for you. Diet pills may make heart problems worse.  Keep all follow-up visits as told by your doctor. This is important. Contact a doctor if:  You notice a change in the speed, rhythm, or  strength of your heartbeat.  You are taking a blood-thinning medicine and you get more bruising.  You get tired more easily when you move or exercise.  You have a sudden change in weight. Get help right away if:  You have pain in your chest or your belly (abdomen).  You have trouble breathing.  You have side effects of blood thinners, such as blood in your vomit, poop (stool), or pee (urine), or bleeding that cannot stop.  You have any signs of a stroke. "BE FAST" is an easy way to remember the main warning signs: ? B - Balance. Signs are dizziness, sudden trouble walking, or loss of balance. ? E - Eyes. Signs are trouble seeing or a change in how you see. ? F - Face. Signs are sudden weakness or loss of feeling in the face, or the face or eyelid drooping on one side. ? A - Arms. Signs are weakness or loss of feeling in an arm. This happens suddenly and usually on one side of the body. ? S - Speech. Signs are sudden trouble speaking, slurred speech, or trouble understanding what people say. ? T - Time. Time to call emergency services. Write down what time symptoms started.  You have other signs of a stroke, such as: ? A sudden, very bad headache with no known cause. ? Feeling like you may vomit (nausea). ? Vomiting. ? A seizure. These symptoms may be an emergency. Do not wait to see if the symptoms will go away. Get medical help right away. Call your local emergency services (911 in the [place] Do not drive yourself to the hospital.   Summary  Atrial fibrillation is a type of heartbeat that is irregular or fast.  You are at higher risk of this condition if you smoke, are older, have diabetes, or are overweight.  Follow your doctor's instructions about medicines, diet, exercise, and follow-up visits.  Get help right away if you have signs or symptoms of a stroke.  Get help right away if you cannot catch your breath, or you have chest pain or discomfort. This information is not  intended to replace advice given to you by your health care provider. Make sure you discuss any questions you have with your health care provider. Document Revised: 06/03/2019 Document Reviewed: 06/03/2019 Elsevier Patient Education  [employer].

## 2021-02-08 NOTE — Progress Notes (Signed)
Patient: Paige Levy MRN: [id number] DOB: [birth year] PCP: Orma Flaming, MD     Subjective:  Chief Complaint  Patient presents with  . Hypothyroidism  . Dyslipidemia    HPI: The patient is a 80 y.o. female who presents today for 6 month f/u.   Hyperlipidemia Her LDL was near 200 when I saw her last time so I started her on crestor at her last visit even though 78 years. We had a discussion regarding treatment and she agreed and wanted to start back her statin. She is fasting today.   Hypothyroidism On synthroid 63mcg/day. No complaints, but tachycardia today. Denies any palpitations.   menieres disease On maxzide. She has been symptom free for a long time.   Irregular heart beat on exam.  New finding. She states she has fatigue but denies any palpitations, leg swelling, cough or shortness of breath. She has no chest pain or dypsnea. No shortness of breath with exertion.   Review of Systems  Constitutional: Positive for fatigue. Negative for chills and fever.  Respiratory: Negative for cough and shortness of breath.   Cardiovascular: Negative for chest pain, palpitations and leg swelling.  Gastrointestinal: Negative for abdominal pain, diarrhea, nausea and vomiting.  Endocrine: Negative for cold intolerance and heat intolerance.    Allergies Patient is allergic to ursodiol.  Past Medical History Patient  has a past medical history of Arthritis, Hyperlipemia, Hypothyroidism, Meniere's disease, and Thyroid disease.  Surgical History Patient  has a past surgical history that includes Abdominal hysterectomy; ERCP (Left, 10/03/2014); Sphincterotomy (09/2014); ERCP (N/A, 01/14/2015); and spyglass lithotripsy (N/A, 01/14/2015).  Family History Pateint's family history includes Heart attack in her mother; Other in her father and mother.  Social History Patient  reports that she has never smoked. She has never used smokeless tobacco. She reports that she does not drink  alcohol and does not use drugs.    Objective: Vitals:   02/08/21 0927  BP: 110/80  Pulse: 94  Temp: (!) 97 F (36.1 C)  TempSrc: Temporal  SpO2: 99%  Weight: 158 lb (71.7 kg)  Height: 5\' 5"  (1.651 m)    Body mass index is 26.29 kg/m.  Physical Exam Vitals reviewed.  Constitutional:      General: She is not in acute distress.    Appearance: Normal appearance. She is normal weight. She is not ill-appearing.  HENT:     Head: Normocephalic and atraumatic.  Neck:     Comments: No thyromegaly  Cardiovascular:     Rate and Rhythm: Normal rate. Rhythm irregular.     Heart sounds: Normal heart sounds. No murmur heard.   Pulmonary:     Effort: Pulmonary effort is normal.     Breath sounds: Normal breath sounds.  Abdominal:     General: Bowel sounds are normal.     Palpations: Abdomen is soft.  Musculoskeletal:     Cervical back: Normal range of motion and neck supple.  Neurological:     General: No focal deficit present.     Mental Status: She is alert and oriented to person, place, and time.  Psychiatric:        Mood and Affect: Mood normal.        Behavior: Behavior normal.    Ekg: atrial fibrillation with rate of 90-130.     Assessment/plan: 1. Other specified hypothyroidism In setting of new afib checking thyroid labs.  - TSH - T4, free  2. Dyslipidemia Started her on crestor at last visit. She is fasting.  -  Lipid panel  3. Atrial fibrillation, unspecified type ([place]) -new finding today. Patient asymptomatic and has been rate controlled. Discussed new diagnosis in detail and new medication.  -starting her on metoprolol daily for rate control will adjust as needed. Blood pressure already runs on lower side so will start her lower dose at 12.5mg , but may need titrated.  -eliquis 2.5mg  BID dosage adjusted for age. CHADS-VASc2 score of 3. Discussed anti coagulation in detail and benefits and risks. Referral to cardiology done as well. Very strict ER precautions  given. Will have f/u with cardiology and then me for routine visit.  - EKG 12-Lead    This visit occurred during the SARS-CoV-2 public health emergency.  Safety protocols were in place, including screening questions prior to the visit, additional usage of staff PPE, and extensive cleaning of exam room while observing appropriate contact time as indicated for disinfecting solutions.     Return in about 6 months (around 08/08/2021) for routine f/u .   Orma Flaming, MD [place]   02/08/2021

## 2021-02-08 NOTE — Progress Notes (Signed)
I only sent in 3 potassium pills. If continues to be low we will have to continue this daily.  Dr. Rogers Blocker

## 2021-02-09 NOTE — Progress Notes (Signed)
Noted. Pt's husband is aware.

## 2021-02-23 ENCOUNTER — Ambulatory Visit (INDEPENDENT_AMBULATORY_CARE_PROVIDER_SITE_OTHER): Payer: Medicare Other | Admitting: Family Medicine

## 2021-02-23 ENCOUNTER — Encounter: Payer: Self-pay | Admitting: Family Medicine

## 2021-02-23 ENCOUNTER — Other Ambulatory Visit: Payer: Self-pay

## 2021-02-23 VITALS — BP 102/80 | HR 71 | Temp 98.5°F | Ht 65.0 in | Wt 156.0 lb

## 2021-02-23 DIAGNOSIS — I4891 Unspecified atrial fibrillation: Secondary | ICD-10-CM | POA: Diagnosis not present

## 2021-02-23 NOTE — Progress Notes (Signed)
Patient: Paige Levy MRN: [id number] DOB: June 07, 1941 PCP: Orma Flaming, MD     Subjective:  Chief Complaint  Patient presents with  . Atrial Fibrillation    2 week follow up.    HPI: The patient is a 80 y.o. female who presents today for A fib follow up.  She is also noticeably tired since starting the beta blocker. She is taking the metoprolol and eliquis. I put her on 12.5mg  of toprol-xl.   -she denies any palpitations, coughing, swelling of legs. She does get short of breath with exertion and doing extra things but overall feels the same besides the fatigue.   Review of Systems  Constitutional: Positive for fatigue. Negative for chills, diaphoresis and fever.  Respiratory: Negative for cough, chest tightness and shortness of breath.   Cardiovascular: Negative for chest pain, palpitations and leg swelling.    Allergies Patient is allergic to ursodiol.  Past Medical History Patient  has a past medical history of Arthritis, Hyperlipemia, Hypothyroidism, Meniere's disease, and Thyroid disease.  Surgical History Patient  has a past surgical history that includes Abdominal hysterectomy; ERCP (Left, 10/03/2014); Sphincterotomy (09/2014); ERCP (N/A, 01/14/2015); and spyglass lithotripsy (N/A, 01/14/2015).  Family History Pateint's family history includes Heart attack in her mother; Other in her father and mother.  Social History Patient  reports that she has never smoked. She has never used smokeless tobacco. She reports that she does not drink alcohol and does not use drugs.    Objective: Vitals:   02/23/21 1307  BP: 102/80  Pulse: 71  Temp: 98.5 F (36.9 C)  TempSrc: Temporal  SpO2: 97%  Weight: 156 lb (70.8 kg)  Height: 5\' 5"  (1.651 m)    Body mass index is 25.96 kg/m.  Physical Exam Vitals reviewed.  Constitutional:      General: She is not in acute distress.    Appearance: Normal appearance. She is normal weight.  HENT:     Head: Normocephalic and  atraumatic.  Cardiovascular:     Rate and Rhythm: Tachycardia present. Rhythm irregular.     Heart sounds: No murmur heard.   Pulmonary:     Effort: Pulmonary effort is normal.     Breath sounds: Normal breath sounds. No rhonchi or rales.  Abdominal:     General: Abdomen is flat. Bowel sounds are normal.     Palpations: Abdomen is soft.  Musculoskeletal:     Cervical back: Normal range of motion and neck supple.  Skin:    General: Skin is warm.     Capillary Refill: Capillary refill takes less than 2 seconds.  Neurological:     General: No focal deficit present.     Mental Status: She is alert and oriented to person, place, and time.  Psychiatric:        Mood and Affect: Mood normal.        Behavior: Behavior normal.   ekg: atrial fib. Rate of 122.   Repeat rate resting: 108.      Assessment/plan: 1. Atrial fibrillation, unspecified type ([place]) Rate of 70-120, continues to be asymptomatic to possibly mild as she gets short of breath when walking up hills. Would like rate better controlled, but not sure she is tolerating beta blocker well. She is very fatigued and noticed this with initiation of the beta blocker but could be combo of afib with beta blocker as well. BP also low. Will continue current course as she has f/u with cardiology on Monday and they can titrate if needed.  Very strict precautions given for ER if she has any chest pain, shortness of breath, swelling in legs, cough. Discussed complications of atrial fib and need to keep rate controlled.  -continue eliquis. CHADS-VASc2 score of 3.  - EKG 12-Lead    This visit occurred during the SARS-CoV-2 public health emergency.  Safety protocols were in place, including screening questions prior to the visit, additional usage of staff PPE, and extensive cleaning of exam room while observing appropriate contact time as indicated for disinfecting solutions.     No follow-ups on file.   Orma Flaming, MD [place]   02/23/2021

## 2021-02-23 NOTE — H&P (View-Only) (Signed)
Patient: Paige Levy MRN: [id number] DOB: 1941-11-20 PCP: Orma Flaming, MD     Subjective:  Chief Complaint  Patient presents with  . Atrial Fibrillation    2 week follow up.    HPI: The patient is a 80 y.o. female who presents today for A fib follow up.  She is also noticeably tired since starting the beta blocker. She is taking the metoprolol and eliquis. I put her on 12.5mg  of toprol-xl.   -she denies any palpitations, coughing, swelling of legs. She does get short of breath with exertion and doing extra things but overall feels the same besides the fatigue.   Review of Systems  Constitutional: Positive for fatigue. Negative for chills, diaphoresis and fever.  Respiratory: Negative for cough, chest tightness and shortness of breath.   Cardiovascular: Negative for chest pain, palpitations and leg swelling.    Allergies Patient is allergic to ursodiol.  Past Medical History Patient  has a past medical history of Arthritis, Hyperlipemia, Hypothyroidism, Meniere's disease, and Thyroid disease.  Surgical History Patient  has a past surgical history that includes Abdominal hysterectomy; ERCP (Left, 10/03/2014); Sphincterotomy (09/2014); ERCP (N/A, 01/14/2015); and spyglass lithotripsy (N/A, 01/14/2015).  Family History Pateint's family history includes Heart attack in her mother; Other in her father and mother.  Social History Patient  reports that she has never smoked. She has never used smokeless tobacco. She reports that she does not drink alcohol and does not use drugs.    Objective: Vitals:   02/23/21 1307  BP: 102/80  Pulse: 71  Temp: 98.5 F (36.9 C)  TempSrc: Temporal  SpO2: 97%  Weight: 156 lb (70.8 kg)  Height: 5\' 5"  (1.651 m)    Body mass index is 25.96 kg/m.  Physical Exam Vitals reviewed.  Constitutional:      General: She is not in acute distress.    Appearance: Normal appearance. She is normal weight.  HENT:     Head: Normocephalic and  atraumatic.  Cardiovascular:     Rate and Rhythm: Tachycardia present. Rhythm irregular.     Heart sounds: No murmur heard.   Pulmonary:     Effort: Pulmonary effort is normal.     Breath sounds: Normal breath sounds. No rhonchi or rales.  Abdominal:     General: Abdomen is flat. Bowel sounds are normal.     Palpations: Abdomen is soft.  Musculoskeletal:     Cervical back: Normal range of motion and neck supple.  Skin:    General: Skin is warm.     Capillary Refill: Capillary refill takes less than 2 seconds.  Neurological:     General: No focal deficit present.     Mental Status: She is alert and oriented to person, place, and time.  Psychiatric:        Mood and Affect: Mood normal.        Behavior: Behavior normal.   ekg: atrial fib. Rate of 122.   Repeat rate resting: 108.      Assessment/plan: 1. Atrial fibrillation, unspecified type ([place]) Rate of 70-120, continues to be asymptomatic to possibly mild as she gets short of breath when walking up hills. Would like rate better controlled, but not sure she is tolerating beta blocker well. She is very fatigued and noticed this with initiation of the beta blocker but could be combo of afib with beta blocker as well. BP also low. Will continue current course as she has f/u with cardiology on Monday and they can titrate if needed.  Very strict precautions given for ER if she has any chest pain, shortness of breath, swelling in legs, cough. Discussed complications of atrial fib and need to keep rate controlled.  -continue eliquis. CHADS-VASc2 score of 3.  - EKG 12-Lead    This visit occurred during the SARS-CoV-2 public health emergency.  Safety protocols were in place, including screening questions prior to the visit, additional usage of staff PPE, and extensive cleaning of exam room while observing appropriate contact time as indicated for disinfecting solutions.     No follow-ups on file.   Orma Flaming, MD [place]   02/23/2021

## 2021-02-27 ENCOUNTER — Encounter: Payer: Self-pay | Admitting: Cardiology

## 2021-02-27 ENCOUNTER — Other Ambulatory Visit: Payer: Self-pay

## 2021-02-27 ENCOUNTER — Ambulatory Visit: Payer: Medicare Other | Admitting: Cardiology

## 2021-02-27 VITALS — BP 118/70 | HR 106 | Ht 65.5 in | Wt 155.8 lb

## 2021-02-27 DIAGNOSIS — Z7189 Other specified counseling: Secondary | ICD-10-CM

## 2021-02-27 DIAGNOSIS — I10 Essential (primary) hypertension: Secondary | ICD-10-CM

## 2021-02-27 DIAGNOSIS — E7800 Pure hypercholesterolemia, unspecified: Secondary | ICD-10-CM

## 2021-02-27 DIAGNOSIS — I4891 Unspecified atrial fibrillation: Secondary | ICD-10-CM

## 2021-02-27 DIAGNOSIS — Z01812 Encounter for preprocedural laboratory examination: Secondary | ICD-10-CM

## 2021-02-27 MED ORDER — METOPROLOL SUCCINATE ER 25 MG PO TB24: 25.0000 mg | ORAL_TABLET | Freq: Every day | ORAL | 3 refills | Status: DC

## 2021-02-27 MED ORDER — APIXABAN 5 MG PO TABS: 5.0000 mg | ORAL_TABLET | Freq: Two times a day (BID) | ORAL | 3 refills | Status: DC

## 2021-02-27 MED ORDER — METOPROLOL SUCCINATE ER 25 MG PO TB24: 25.0000 mg | ORAL_TABLET | Freq: Two times a day (BID) | ORAL | 3 refills | Status: DC

## 2021-02-27 NOTE — Progress Notes (Signed)
Cardiology Office Note:    Date:  02/27/2021   ID:  Paige Levy, DOB 1941-06-17, MRN [id number]  PCP:  Orma Flaming, MD  Cardiologist:  Buford Dresser, MD  Referring MD: Orma Flaming, MD   CC: new patient evaluation for atrial fibrillation  History of Present Illness:    Paige Levy is a 80 y.o. female with a hx of hypothyroidism, hyperlipidemia, hypertension who is seen as a new consult at the request of Orma Flaming, MD for the evaluation and management of atrial fibrillation.  Prior notes from Dr. Rogers Blocker reviewed, most recently 02/23/21. She was found to have an irregular heart beat on exam 02/08/21, and ECG noted atrial fibrillation at 130 bpm. She was started on metoprolol and apixaban 2.5 mg BID. On follow up 02/23/21, she was having fatigue. ECG noted afib at 122 bpm.   Today: Had been feeling well until recently. Noted feeling breathless when walking her dogs up hill, which is new for her. Felt more fatigued. Has been more aware of her heart, not clearly palpitations or pain.   Denies chest pain, shortness of breath at rest or with normal exertion. No PND, orthopnea, LE edema or unexpected weight gain. No syncope or palpitations.  Past Medical History:  Diagnosis Date  . Arthritis   . Hyperlipemia   . Hypothyroidism   . Meniere's disease   . Thyroid disease     Past Surgical History:  Procedure Laterality Date  . ABDOMINAL HYSTERECTOMY    . ERCP Left 10/03/2014   Procedure: ENDOSCOPIC RETROGRADE CHOLANGIOPANCREATOGRAPHY (ERCP);  Surgeon: Missy Sabins, MD;  Location: [facility];  Service: Gastroenterology;  Laterality: Left;  . ERCP N/A 01/14/2015   Procedure: ENDOSCOPIC RETROGRADE CHOLANGIOPANCREATOGRAPHY (ERCP);  Surgeon: Jeryl Columbia, MD;  Location: [facility] ENDOSCOPY;  Service: Endoscopy;  Laterality: N/A;  need lithotripter/ordered/LH  . SPHINCTEROTOMY  09/2014  . SPYGLASS LITHOTRIPSY N/A 01/14/2015   Procedure: EHUDJSHF LITHOTRIPSY;  Surgeon: Jeryl Columbia, MD;  Location: [facility] ENDOSCOPY;  Service: Endoscopy;  Laterality: N/A;    Current Medications: Current Outpatient Medications on File Prior to Visit  Medication Sig  . apixaban (ELIQUIS) 2.5 MG TABS tablet Take 1 tablet (2.5 mg total) by mouth 2 (two) times daily.  Marland Kitchen levothyroxine (SYNTHROID) 88 MCG tablet TAKE 1 TABLET (88 MCG TOTAL) BY MOUTH DAILY BEFORE BREAKFAST.  . metoprolol succinate (TOPROL-XL) 25 MG 24 hr tablet Take 0.5 tablets (12.5 mg total) by mouth daily.  . Multiple Vitamin (MULTIVITAMIN WITH MINERALS) TABS tablet Take 1 tablet by mouth daily.  . naphazoline-glycerin (CLEAR EYES) 0.012-0.2 % SOLN Place 1-2 drops into both eyes every 4 (four) hours as needed for irritation.  . potassium chloride (KLOR-CON) 10 MEQ tablet Take 1 tablet (10 mEq total) by mouth daily.  . rosuvastatin (CRESTOR) 10 MG tablet Take 1 tablet (10 mg total) by mouth daily.  Marland Kitchen triamterene-hydrochlorothiazide (MAXZIDE) 75-50 MG tablet Take 1 tablet by mouth daily.   No current facility-administered medications on file prior to visit.     Allergies:   Ursodiol   Social History   Tobacco Use  . Smoking status: Never Smoker  . Smokeless tobacco: Never Used  Substance Use Topics  . Alcohol use: No  . Drug use: No    Family History: family history includes Heart attack in her mother; Other in her father and mother.  ROS:   Please see the history of present illness.  Additional pertinent ROS: Constitutional: Negative for chills, fever, night sweats, unintentional weight loss  HENT: Negative for ear pain and hearing loss.   Eyes: Negative for loss of vision and eye pain.  Respiratory: Negative for cough, sputum, wheezing.   Cardiovascular: See HPI. Gastrointestinal: Negative for abdominal pain, melena, and hematochezia.  Genitourinary: Negative for dysuria and hematuria.  Musculoskeletal: Negative for falls and myalgias.  Skin: Negative for itching and rash.  Neurological: Negative for focal  weakness, focal sensory changes and loss of consciousness.  Endo/Heme/Allergies: Does not bruise/bleed easily.     EKGs/Labs/Other Studies Reviewed:    The following studies were reviewed today: No prior  EKG:  EKG is personally reviewed.  The ekg ordered today demonstrates atrial fibrillation with RVR at 106 bpm  Recent Labs: 02/08/2021: ALT 14; BUN 14; Creatinine, Ser 0.88; Hemoglobin 13.8; Platelets 238.0; Potassium 3.3; Sodium 138; TSH 0.86  Recent Lipid Panel    Component Value Date/Time   CHOL 151 02/08/2021 1039   TRIG 108.0 02/08/2021 1039   HDL 57.50 02/08/2021 1039   CHOLHDL 3 02/08/2021 1039   VLDL 21.6 02/08/2021 1039   LDLCALC 72 02/08/2021 1039   LDLCALC 195 (H) 08/03/2020 1029    Physical Exam:    VS:  BP 118/70 (BP Location: Left Arm, Patient Position: Sitting)   Pulse (!) 106   Ht 5' 5.5" (1.664 m)   Wt 155 lb 12.8 oz (70.7 kg)   SpO2 95%   BMI 25.53 kg/m     Wt Readings from Last 3 Encounters:  02/23/21 156 lb (70.8 kg)  02/08/21 158 lb (71.7 kg)  12/12/20 156 lb 6.4 oz (70.9 kg)    GEN: Well nourished, well developed in no acute distress HEENT: Normal, moist mucous membranes NECK: No JVD CARDIAC: tachycardic, irregularly irregular rhythm, normal S1 and S2, no rubs or gallops. No murmur. VASCULAR: Radial and DP pulses 2+ bilaterally. No carotid bruits RESPIRATORY:  Clear to auscultation without rales, wheezing or rhonchi  ABDOMEN: Soft, non-tender, non-distended MUSCULOSKELETAL:  Ambulates independently SKIN: Warm and dry, no edema NEUROLOGIC:  Alert and oriented x 3. No focal neuro deficits noted. PSYCHIATRIC:  Normal affect    ASSESSMENT:    1. Atrial fibrillation, unspecified type ([place])   2. Essential hypertension   3. Pure hypercholesterolemia   4. Cardiac risk counseling   5. Pre-procedure lab exam    PLAN:    Recent diagnosis of new atrial fibrillation: -has been in RVR on prior ECGs -CHA2DS2/VAS Stroke Risk Points=3 -currently  on apixaban 2.5 mg BID. She meets age criteria for dose reduction, but not weight or Cr. Will increase to 5 mg BID, needs 3 weeks of this prior to cardioversion -ordered echocardiogram -thyroid levels WNL -Shared Decision Making/Informed Consent The risks (stroke, cardiac arrhythmias rarely resulting in the need for a temporary or permanent pacemaker, skin irritation or burns and complications associated with conscious sedation including aspiration, arrhythmia, respiratory failure and death), benefits (restoration of normal sinus rhythm) and alternatives of a direct current cardioversion were explained in detail to Paige Levy and she agrees to proceed.  -discussed TEE, but she would prefer to be on 3 weeks anticoagulation and then proceed with cardioversion only. We have scheduled this for 03/21/21 -labs ordered  Hypertension: -continue triamterene-HCTZ, has had some issues with hypokalemia but being monitored by Dr. Rogers Blocker.  Hypercholesterolemia: -much improved on statin -continue rosuvastatin 10 mg daily  Cardiac risk counseling and prevention recommendations: -recommend heart healthy/Mediterranean diet, with whole grains, fruits, vegetable, fish, lean meats, nuts, and olive oil. Limit salt. -recommend moderate walking, 3-5 times/week  for 30-50 minutes each session. Aim for at least 150 minutes.week. Goal should be pace of 3 miles/hours, or walking 1.5 miles in 30 minutes -recommend avoidance of tobacco products. Avoid excess alcohol. -ASCVD risk score: The ASCVD Risk score Mikey Bussing DC Jr., et al., 2013) failed to calculate for the following reasons:   The 2013 ASCVD risk score is only valid for ages 40 to 9    Plan for follow up: 1 week post cardioversion  Buford Dresser, MD, PhD, [facility]    Medication Adjustments/Labs and Tests Ordered: Current medicines are reviewed at length with the patient today.  Concerns regarding medicines are outlined above.   Orders Placed This Encounter  Procedures  . Basic metabolic panel  . CBC  . Specimen status report  . EKG 12-Lead  . ECHOCARDIOGRAM COMPLETE   Meds ordered this encounter  Medications  . apixaban (ELIQUIS) 5 MG TABS tablet    Sig: Take 1 tablet (5 mg total) by mouth 2 (two) times daily.    Dispense:  90 tablet    Refill:  3  . DISCONTD: metoprolol succinate (TOPROL-XL) 25 MG 24 hr tablet    Sig: Take 1 tablet (25 mg total) by mouth daily.    Dispense:  90 tablet    Refill:  3  . DISCONTD: metoprolol succinate (TOPROL-XL) 25 MG 24 hr tablet    Sig: Take 1 tablet (25 mg total) by mouth 2 (two) times daily.    Dispense:  90 tablet    Refill:  3    Patient Instructions  Medication Instructions:  -increase dose of apixaban (Eliquis) to 5 mg twice a day. You can take two of the 2.5 mg dose twice a day until you run out, then change to the 5 mg twice daily pills -increase metoprolol succinate from 12.5 mg (half pill) to 25 mg  full pill twice a day. We will see if we can stop this after the cardioversion.  *If you need a refill on your cardiac medications before your next appointment, please call your pharmacy*   Lab Work: Your physician recommends that you return for lab work on March 17, 2021 (BMP, CBC).  If you have labs (blood work) drawn today and your tests are completely normal, you will receive your results only by: Marland Kitchen MyChart Message (if you have MyChart) OR . A paper copy in the mail If you have any lab test that is abnormal or we need to change your treatment, we will call you to review the results.   Testing/Procedures: Your physician has requested that you have an echocardiogram. Echocardiography is a painless test that uses sound waves to create images of your heart. It provides your doctor with information about the size and shape of your heart and how well your heart's chambers and valves are working. This procedure takes approximately one hour. There are no  restrictions for this procedure. [facility]  You will need to have the coronavirus test completed prior to your procedure. An appointment has been made at 9:20 on Friday 03/17/21. This is a Drive Up Visit at [address], [place], [place] [address]. Please tell them that you are there for procedure testing. Stay in your car and someone will be with you shortly. Please make sure to have all other labs completed before this test because you will need to stay quarantined until your procedure.   Follow-Up: At [facility], you and your health needs are our priority.  As part of our continuing mission to provide you with exceptional heart care, we have created designated Provider Care Teams.  These Care Teams include your primary Cardiologist (physician) and Advanced Practice Providers (APPs -  Physician Assistants and Nurse Practitioners) who all work together to provide you with the care you need, when you need it.  We recommend signing up for the patient portal called "MyChart".  Sign up information is provided on this After Visit Summary.  MyChart is used to connect with patients for Virtual Visits (Telemedicine).  Patients are able to view lab/test results, encounter notes, upcoming appointments, etc.  Non-urgent messages can be sent to your provider as well.   To learn more about what you can do with MyChart, go to [email or web address].    Your next appointment:   1 week(s) after cardioversion on 03/21/21  The format for your next appointment:   In Person  Provider:   Buford Dresser, MD    You are scheduled for a  Cardioversion on Tuesday 03/21/21 with Dr. Sallyanne Kuster.  Please arrive at the [facility] (Main Entrance A) at [facility]: [address] [place], [place] [address] at 7:30 am.  DIET: Nothing to eat or drink after midnight except a sip of water with medications (see medication instructions  below)  Medication Instructions:  Continue your anticoagulant: Eliquis 5 mg twice a day You will need to continue your anticoagulant after your procedure until you  are told by your  Provider that it is safe to stop   Labs: March 17, 2021 ( CBC, BMP).  You must have a responsible person to drive you home and stay in the waiting area during your procedure. Failure to do so could result in cancellation.  Bring your insurance cards.  *Special Note: Every effort is made to have your procedure done on time. Occasionally there are emergencies that occur at the hospital that may cause delays. Please be patient if a delay does occur.     Signed, Buford Dresser, MD PhD 02/27/2021    [place]

## 2021-02-27 NOTE — Patient Instructions (Addendum)
Medication Instructions:  -increase dose of apixaban (Eliquis) to 5 mg twice a day. You can take two of the 2.5 mg dose twice a day until you run out, then change to the 5 mg twice daily pills -increase metoprolol succinate from 12.5 mg (half pill) to 25 mg  full pill twice a day. We will see if we can stop this after the cardioversion.  *If you need a refill on your cardiac medications before your next appointment, please call your pharmacy*   Lab Work: Your physician recommends that you return for lab work on March 17, 2021 (BMP, CBC).  If you have labs (blood work) drawn today and your tests are completely normal, you will receive your results only by: Marland Kitchen MyChart Message (if you have MyChart) OR . A paper copy in the mail If you have any lab test that is abnormal or we need to change your treatment, we will call you to review the results.   Testing/Procedures: Your physician has requested that you have an echocardiogram. Echocardiography is a painless test that uses sound waves to create images of your heart. It provides your doctor with information about the size and shape of your heart and how well your heart's chambers and valves are working. This procedure takes approximately one hour. There are no restrictions for this procedure. [facility]  You will need to have the coronavirus test completed prior to your procedure. An appointment has been made at 9:20 on Friday 03/17/21. This is a Drive Up Visit at [address], [place], [place] [address]. Please tell them that you are there for procedure testing. Stay in your car and someone will be with you shortly. Please make sure to have all other labs completed before this test because you will need to stay quarantined until your procedure.   Follow-Up: At [facility], you and your health needs are our priority.  As part of our continuing mission to provide you with  exceptional heart care, we have created designated Provider Care Teams.  These Care Teams include your primary Cardiologist (physician) and Advanced Practice Providers (APPs -  Physician Assistants and Nurse Practitioners) who all work together to provide you with the care you need, when you need it.  We recommend signing up for the patient portal called "MyChart".  Sign up information is provided on this After Visit Summary.  MyChart is used to connect with patients for Virtual Visits (Telemedicine).  Patients are able to view lab/test results, encounter notes, upcoming appointments, etc.  Non-urgent messages can be sent to your provider as well.   To learn more about what you can do with MyChart, go to [email or web address].    Your next appointment:   1 week(s) after cardioversion on 03/21/21  The format for your next appointment:   In Person  Provider:   Buford Dresser, MD    You are scheduled for a  Cardioversion on Tuesday 03/21/21 with Dr. Sallyanne Kuster.  Please arrive at the [facility] (Main Entrance A) at [facility]: [address] [place], [place] [address] at 7:30 am.  DIET: Nothing to eat or drink after midnight except a sip of water with medications (see medication instructions below)  Medication Instructions:  Continue your anticoagulant: Eliquis 5 mg twice a day You will need to continue your anticoagulant after your procedure until you  are told by your  Provider that it is safe to stop  Labs: March 17, 2021 ( CBC, BMP).  You must have a responsible person to drive you home and stay in the waiting area during your procedure. Failure to do so could result in cancellation.  Bring your insurance cards.  *Special Note: Every effort is made to have your procedure done on time. Occasionally there are emergencies that occur at the hospital that may cause delays. Please be patient if a delay does occur.

## 2021-03-17 ENCOUNTER — Other Ambulatory Visit (HOSPITAL_COMMUNITY)
Admission: RE | Admit: 2021-03-17 | Discharge: 2021-03-17 | Disposition: A | Discharge status: Home / Self Care | Payer: Medicare Other | Source: Ambulatory Visit | Attending: Cardiovascular Disease | Admitting: Cardiovascular Disease

## 2021-03-17 DIAGNOSIS — Z01812 Encounter for preprocedural laboratory examination: Secondary | ICD-10-CM | POA: Insufficient documentation

## 2021-03-17 DIAGNOSIS — Z20822 Contact with and (suspected) exposure to covid-19: Secondary | ICD-10-CM | POA: Diagnosis not present

## 2021-03-17 LAB — SPECIMEN STATUS REPORT

## 2021-03-17 LAB — CBC
Hematocrit: 43.2 % (ref 34.0–46.6)
Hemoglobin: 14.1 g/dL (ref 11.1–15.9)
MCH: 30 pg (ref 26.6–33.0)
MCHC: 32.6 g/dL (ref 31.5–35.7)
MCV: 92 fL (ref 79–97)
Platelets: 233 10*3/uL (ref 150–450)
RBC: 4.7 x10E6/uL (ref 3.77–5.28)
RDW: 12 % (ref 11.7–15.4)
WBC: 6.7 10*3/uL (ref 3.4–10.8)

## 2021-03-17 LAB — BASIC METABOLIC PANEL
BUN/Creatinine Ratio: 11 — ABNORMAL LOW (ref 12–28)
BUN: 11 mg/dL (ref 8–27)
CO2: 26 mmol/L (ref 20–29)
Calcium: 9.3 mg/dL (ref 8.7–10.3)
Chloride: 100 mmol/L (ref 96–106)
Creatinine, Ser: 1.03 mg/dL — ABNORMAL HIGH (ref 0.57–1.00)
Glucose: 92 mg/dL (ref 65–99)
Potassium: 3.8 mmol/L (ref 3.5–5.2)
Sodium: 141 mmol/L (ref 134–144)
eGFR: 55 mL/min/{1.73_m2} — ABNORMAL LOW (ref >59)

## 2021-03-17 LAB — SARS CORONAVIRUS 2 (TAT 6-24 HRS): SARS Coronavirus 2: NEGATIVE

## 2021-03-20 MED ORDER — METOPROLOL SUCCINATE ER 25 MG PO TB24: 25.0000 mg | ORAL_TABLET | Freq: Two times a day (BID) | ORAL | 3 refills | Status: DC

## 2021-03-21 ENCOUNTER — Ambulatory Visit (HOSPITAL_COMMUNITY): Payer: Medicare Other | Admitting: Anesthesiology

## 2021-03-21 ENCOUNTER — Other Ambulatory Visit: Payer: Self-pay

## 2021-03-21 ENCOUNTER — Encounter: Payer: Self-pay | Admitting: Cardiology

## 2021-03-21 ENCOUNTER — Encounter (HOSPITAL_COMMUNITY): Payer: Self-pay | Admitting: Cardiovascular Disease

## 2021-03-21 ENCOUNTER — Encounter (HOSPITAL_COMMUNITY)
Admission: RE | Disposition: A | Discharge status: Home / Self Care | Payer: Self-pay | Source: Home / Self Care | Attending: Cardiovascular Disease

## 2021-03-21 ENCOUNTER — Ambulatory Visit (HOSPITAL_COMMUNITY)
Admission: RE | Admit: 2021-03-21 | Discharge: 2021-03-21 | Disposition: A | Discharge status: Home / Self Care | Payer: Medicare Other | Attending: Cardiovascular Disease | Admitting: Cardiovascular Disease

## 2021-03-21 DIAGNOSIS — Z7901 Long term (current) use of anticoagulants: Secondary | ICD-10-CM | POA: Diagnosis not present

## 2021-03-21 DIAGNOSIS — I4819 Other persistent atrial fibrillation: Secondary | ICD-10-CM | POA: Diagnosis not present

## 2021-03-21 DIAGNOSIS — I4891 Unspecified atrial fibrillation: Secondary | ICD-10-CM | POA: Insufficient documentation

## 2021-03-21 HISTORY — PX: CARDIOVERSION: SHX1299

## 2021-03-21 SURGERY — CARDIOVERSION
Anesthesia: General

## 2021-03-21 MED ORDER — LIDOCAINE 2% (20 MG/ML) 5 ML SYRINGE
INTRAMUSCULAR | Status: DC | PRN
  Administered 2021-03-21: 20 mg via INTRAVENOUS

## 2021-03-21 MED ORDER — PROPOFOL 10 MG/ML IV BOLUS
INTRAVENOUS | Status: DC | PRN
  Administered 2021-03-21 (×2): 20 mg via INTRAVENOUS

## 2021-03-21 MED ORDER — SODIUM CHLORIDE 0.9 % IV SOLN: INTRAVENOUS | Status: DC | PRN

## 2021-03-21 NOTE — Interval H&P Note (Signed)
History and Physical Interval Note:  03/21/2021 7:38 AM  Paige Levy  has presented today for surgery, with the diagnosis of AFIB.  The various methods of treatment have been discussed with the patient and family. After consideration of risks, benefits and other options for treatment, the patient has consented to  Procedure(s): CARDIOVERSION (N/A) as a surgical intervention.  The patient's history has been reviewed, patient examined, no change in status, stable for surgery.  I have reviewed the patient's chart and labs.  Questions were answered to the patient's satisfaction.     [name]

## 2021-03-21 NOTE — Interval H&P Note (Signed)
History and Physical Interval Note:  03/21/2021 7:38 AM  Paige Levy  has presented today for surgery, with the diagnosis of AFIB.  The various methods of treatment have been discussed with the patient and family. After consideration of risks, benefits and other options for treatment, the patient has consented to  Procedure(s): CARDIOVERSION (N/A) as a surgical intervention.  The patient's history has been reviewed, patient examined, no change in status, stable for surgery.  I have reviewed the patient's chart and labs.  Questions were answered to the patient's satisfaction.     [place]

## 2021-03-21 NOTE — Op Note (Signed)
Procedure: Electrical Cardioversion Indications:  Atrial Fibrillation  Procedure Details:  Consent: Risks of procedure as well as the alternatives and risks of each were explained to the (patient/caregiver).  Consent for procedure obtained.  Time Out: Verified patient identification, verified procedure, site/side was marked, verified correct patient position, special equipment/implants available, medications/allergies/relevent history reviewed, required imaging and test results available.  Performed  Patient placed on cardiac monitor, pulse oximetry, supplemental oxygen as necessary.  Sedation given: Propofol 40 mg IV, Dr. Deatra Canter Pacer pads placed anterior and posterior chest.  Cardioverted 1 time(s).  Cardioversion with synchronized biphasic 120J shock.  Evaluation: Findings: Post procedure EKG shows: NSR Complications: None Patient did tolerate procedure well.  Time Spent Directly with the Patient:  30 minutes   Paige Levy 03/21/2021, 8:10 AM

## 2021-03-21 NOTE — Anesthesia Procedure Notes (Signed)
Procedure Name: General with mask airway Performed by: Renato Shin, CRNA Pre-anesthesia Checklist: Patient identified, Emergency Drugs available, Suction available and Patient being monitored Patient Re-evaluated:Patient Re-evaluated prior to induction Oxygen Delivery Method: Ambu bag Preoxygenation: Pre-oxygenation with 100% oxygen Induction Type: IV induction Placement Confirmation: positive ETCO2 and breath sounds checked- equal and bilateral Dental Injury: Teeth and Oropharynx as per pre-operative assessment

## 2021-03-21 NOTE — Discharge Instructions (Signed)

## 2021-03-21 NOTE — Transfer of Care (Signed)
Immediate Anesthesia Transfer of Care Note  Patient: Paige Levy  Procedure(s) Performed: CARDIOVERSION (N/A )  Patient Location: PACU and Endoscopy Unit  Anesthesia Type:General  Level of Consciousness: drowsy and patient cooperative  Airway & Oxygen Therapy: Patient Spontanous Breathing and Patient connected to nasal cannula oxygen  Post-op Assessment: Report given to RN and Post -op Vital signs reviewed and stable  Post vital signs: Reviewed and stable  Last Vitals:  Vitals Value Taken Time  BP    Temp    Pulse    Resp    SpO2      Last Pain:  Vitals:   03/21/21 0751  TempSrc: Oral  PainSc: 0-No pain         Complications: No complications documented.

## 2021-03-21 NOTE — Anesthesia Preprocedure Evaluation (Signed)
Anesthesia Evaluation  Patient identified by MRN, date of birth, ID band Patient awake    Reviewed: Allergy & Precautions, NPO status , Patient's Chart, lab work & pertinent test results  Airway Mallampati: II  TM Distance: >3 FB Neck ROM: Full    Dental   Pulmonary neg pulmonary ROS,    breath sounds clear to auscultation       Cardiovascular hypertension, Pt. on medications and Pt. on home beta blockers + dysrhythmias Atrial Fibrillation  Rhythm:Irregular Rate:Normal     Neuro/Psych negative neurological ROS     GI/Hepatic negative GI ROS, Neg liver ROS,   Endo/Other  Hypothyroidism   Renal/GU negative Renal ROS     Musculoskeletal  (+) Arthritis ,   Abdominal   Peds  Hematology negative hematology ROS (+)   Anesthesia Other Findings   Reproductive/Obstetrics                             Anesthesia Physical Anesthesia Plan  ASA: III  Anesthesia Plan: General   Post-op Pain Management:    Induction: Intravenous  PONV Risk Score and Plan: Treatment may vary due to age or medical condition  Airway Management Planned: Natural Airway and Simple Face Mask  Additional Equipment:   Intra-op Plan:   Post-operative Plan:   Informed Consent: I have reviewed the patients History and Physical, chart, labs and discussed the procedure including the risks, benefits and alternatives for the proposed anesthesia with the patient or authorized representative who has indicated his/her understanding and acceptance.       Plan Discussed with: CRNA  Anesthesia Plan Comments:         Anesthesia Quick Evaluation

## 2021-03-22 NOTE — Anesthesia Postprocedure Evaluation (Signed)
Anesthesia Post Note  Patient: Paige Levy  Procedure(s) Performed: CARDIOVERSION (N/A )     Patient location during evaluation: PACU Anesthesia Type: General Level of consciousness: awake and alert Pain management: pain level controlled Vital Signs Assessment: post-procedure vital signs reviewed and stable Respiratory status: spontaneous breathing, nonlabored ventilation, respiratory function stable and patient connected to nasal cannula oxygen Cardiovascular status: blood pressure returned to baseline and stable Postop Assessment: no apparent nausea or vomiting Anesthetic complications: no   No complications documented.  Last Vitals:  Vitals:   03/21/21 0817 03/21/21 0829  BP: (!) 100/56 116/67  Pulse: 80 79  Resp: (!) 26 20  Temp: 37.2 C   SpO2: 98% 98%    Last Pain:  Vitals:   03/21/21 0829  TempSrc:   PainSc: 0-No pain                 Tiajuana Amass

## 2021-03-24 ENCOUNTER — Other Ambulatory Visit: Payer: Self-pay

## 2021-03-24 ENCOUNTER — Ambulatory Visit (HOSPITAL_COMMUNITY): Payer: Medicare Other | Attending: Cardiology

## 2021-03-24 DIAGNOSIS — I4891 Unspecified atrial fibrillation: Secondary | ICD-10-CM | POA: Diagnosis present

## 2021-03-24 LAB — ECHOCARDIOGRAM COMPLETE: S' Lateral: 3.2 cm

## 2021-03-29 ENCOUNTER — Ambulatory Visit (INDEPENDENT_AMBULATORY_CARE_PROVIDER_SITE_OTHER): Payer: Medicare Other | Admitting: Cardiology

## 2021-03-29 ENCOUNTER — Encounter: Payer: Self-pay | Admitting: Cardiology

## 2021-03-29 ENCOUNTER — Other Ambulatory Visit: Payer: Self-pay

## 2021-03-29 VITALS — BP 122/60 | HR 63 | Ht 65.0 in | Wt 156.2 lb

## 2021-03-29 DIAGNOSIS — E7800 Pure hypercholesterolemia, unspecified: Secondary | ICD-10-CM | POA: Insufficient documentation

## 2021-03-29 DIAGNOSIS — Z7901 Long term (current) use of anticoagulants: Secondary | ICD-10-CM | POA: Diagnosis not present

## 2021-03-29 DIAGNOSIS — Z712 Person consulting for explanation of examination or test findings: Secondary | ICD-10-CM

## 2021-03-29 DIAGNOSIS — D6869 Other thrombophilia: Secondary | ICD-10-CM

## 2021-03-29 DIAGNOSIS — I10 Essential (primary) hypertension: Secondary | ICD-10-CM | POA: Insufficient documentation

## 2021-03-29 DIAGNOSIS — I480 Paroxysmal atrial fibrillation: Secondary | ICD-10-CM

## 2021-03-29 NOTE — Patient Instructions (Addendum)
Medication Instructions:  Your Physician recommend you continue on your current medication as directed.    *If you need a refill on your cardiac medications before your next appointment, please call your pharmacy*   Lab Work: None   Testing/Procedures: None   Follow-Up: At [facility], you and your health needs are our priority.  As part of our continuing mission to provide you with exceptional heart care, we have created designated Provider Care Teams.  These Care Teams include your primary Cardiologist (physician) and Advanced Practice Providers (APPs -  Physician Assistants and Nurse Practitioners) who all work together to provide you with the care you need, when you need it.  We recommend signing up for the patient portal called "MyChart".  Sign up information is provided on this After Visit Summary.  MyChart is used to connect with patients for Virtual Visits (Telemedicine).  Patients are able to view lab/test results, encounter notes, upcoming appointments, etc.  Non-urgent messages can be sent to your provider as well.   To learn more about what you can do with MyChart, go to [email or web address].    Your next appointment:   1 year(s) @ [address] [place], [place] [address]   The format for your next appointment:   In Person  Provider:   Buford Dresser, MD    [place]: Website: [email or web address]  DR. Harrell Gave RECOMMENDS YOU PURCHASE  " Kardia" [employer] FROM THE  [employer]  APP PLAY STORE.  THE APP IS FREE , BUT THE  EQUIPMENT HAS A COST. IT ALLOWS YOU TO OBTAIN A RECORDING OF YOUR HEART RATE AND RHYTHM BY PROVIDING A SHORT STRIP THAT YOU CAN SHARE WITH YOUR PROVIDER.

## 2021-03-29 NOTE — Progress Notes (Signed)
Cardiology Office Note:    Date:  03/29/2021   ID:  Paige Levy, DOB 16-Jun-1941, MRN [id number]  PCP:  Paige Flaming, MD  Cardiologist:  Buford Dresser, MD  Referring MD: Paige Flaming, MD   CC: follow up post cardioversion  History of Present Illness:    Paige Levy is a 80 y.o. female with a hx of hypothyroidism, hyperlipidemia, hypertension who is seen for follow up today. I initially met her 02/27/21 as a new consult at the request of Paige Flaming, MD for the evaluation and management of atrial fibrillation.  Today: Feeling much better post cardioversion. Never had a sensation of palpitations, but has more energy, less short of breath with exertion. Tolerating medications well, now taking metoprolol succinate 25 mg daily and doing well on this. Asking about how she can know if she goes back into atrial fibrillation. Discussed HR monitoring by BP cuff or pulse ox. Also demo'ed KardiaMobile in office. She likes this and will look into purchasing.  We reviewed her echo results as well.  Denies chest pain, shortness of breath at rest or with normal exertion. No PND, orthopnea, LE edema or unexpected weight gain. No syncope or palpitations.  Past Medical History:  Diagnosis Date  . Arthritis   . Hyperlipemia   . Hypothyroidism   . Meniere's disease   . Thyroid disease     Past Surgical History:  Procedure Laterality Date  . ABDOMINAL HYSTERECTOMY    . CARDIOVERSION N/A 03/21/2021   Procedure: CARDIOVERSION;  Surgeon: Sanda Klein, MD;  [facility];  Service: Cardiovascular;  Laterality: N/A;  . ERCP Left 10/03/2014   Procedure: ENDOSCOPIC RETROGRADE CHOLANGIOPANCREATOGRAPHY (ERCP);  Surgeon: Missy Sabins, MD;  Location: [facility];  Service: Gastroenterology;  Laterality: Left;  . ERCP N/A 01/14/2015   Procedure: ENDOSCOPIC RETROGRADE CHOLANGIOPANCREATOGRAPHY (ERCP);  Surgeon: Jeryl Columbia, MD;  Location: [facility] ENDOSCOPY;  Service: Endoscopy;   Laterality: N/A;  need lithotripter/ordered/LH  . SPHINCTEROTOMY  09/2014  . SPYGLASS LITHOTRIPSY N/A 01/14/2015   Procedure: [id number] LITHOTRIPSY;  Surgeon: Jeryl Columbia, MD;  Location: [facility] ENDOSCOPY;  Service: Endoscopy;  Laterality: N/A;    Current Medications: Current Outpatient Medications on File Prior to Visit  Medication Sig  . apixaban (ELIQUIS) 5 MG TABS tablet Take 1 tablet (5 mg total) by mouth 2 (two) times daily.  Marland Kitchen levothyroxine (SYNTHROID) 88 MCG tablet TAKE 1 TABLET (88 MCG TOTAL) BY MOUTH DAILY BEFORE BREAKFAST.  . metoprolol succinate (TOPROL-XL) 25 MG 24 hr tablet Take 25 mg by mouth daily.  . Multiple Vitamin (MULTIVITAMIN WITH MINERALS) TABS tablet Take 2 tablets by mouth daily. Gummies  . naphazoline-glycerin (CLEAR EYES) 0.012-0.2 % SOLN Place 1-2 drops into both eyes every 4 (four) hours as needed for irritation.  . Potassium Gluconate 550 MG TABS Take 550 mg by mouth daily.  . rosuvastatin (CRESTOR) 10 MG tablet Take 1 tablet (10 mg total) by mouth daily.  Marland Kitchen triamterene-hydrochlorothiazide (MAXZIDE) 75-50 MG tablet Take 1 tablet by mouth daily.   No current facility-administered medications on file prior to visit.     Allergies:   Ursodiol   Social History   Tobacco Use  . Smoking status: Never Smoker  . Smokeless tobacco: Never Used  Substance Use Topics  . Alcohol use: No  . Drug use: No    Family History: family history includes Heart attack in her mother; Other in her father and mother.  ROS:   Please see the history of present illness.  Additional pertinent ROS otherwise unremarkable.  EKGs/Labs/Other Studies Reviewed:    The following studies were reviewed today: Echo 03/24/21 1. Left ventricular ejection fraction, by estimation, is 55 to 60%. The  left ventricle has normal function. The left ventricle has no regional  wall motion abnormalities. Left ventricular diastolic parameters are  indeterminate.  2. Right ventricular systolic  function is normal. The right ventricular  size is normal.  3. The mitral valve is normal in structure. Mild mitral valve  regurgitation. No evidence of mitral stenosis.  4. The aortic valve is tricuspid. Aortic valve regurgitation is not  visualized. Mild to moderate aortic valve sclerosis/calcification is  present, without any evidence of aortic stenosis.  5. The inferior vena cava is normal in size with greater than 50%  respiratory variability, suggesting right atrial pressure of 3 mmHg.  EKG:  EKG is personally reviewed.  The ekg ordered 03/29/21 demonstrates sinus rhythm with PACs at 63 bpm. Prior: 02/27/21: atrial fibrillation with RVR at 106 bpm  Recent Labs: 02/08/2021: ALT 14; TSH 0.86 03/17/2021: BUN 11; Creatinine, Ser 1.03; Hemoglobin 14.1; Platelets 233; Potassium 3.8; Sodium 141  Recent Lipid Panel    Component Value Date/Time   CHOL 151 02/08/2021 1039   TRIG 108.0 02/08/2021 1039   HDL 57.50 02/08/2021 1039   CHOLHDL 3 02/08/2021 1039   VLDL 21.6 02/08/2021 1039   LDLCALC 72 02/08/2021 1039   LDLCALC 195 (H) 08/03/2020 1029    Physical Exam:    VS:  BP 122/60   Pulse 63   Ht 5' 5"  (1.651 m)   Wt 156 lb 3.2 oz (70.9 kg)   BMI 25.99 kg/m     Wt Readings from Last 3 Encounters:  03/29/21 156 lb 3.2 oz (70.9 kg)  03/21/21 154 lb (69.9 kg)  02/27/21 155 lb 12.8 oz (70.7 kg)    GEN: Well nourished, well developed in no acute distress HEENT: Normal, moist mucous membranes NECK: No JVD CARDIAC: regular rhythm, normal S1 and S2, no rubs or gallops. No murmur. VASCULAR: Radial and DP pulses 2+ bilaterally. No carotid bruits RESPIRATORY:  Clear to auscultation without rales, wheezing or rhonchi  ABDOMEN: Soft, non-tender, non-distended MUSCULOSKELETAL:  Ambulates independently SKIN: Warm and dry, no edema NEUROLOGIC:  Alert and oriented x 3. No focal neuro deficits noted. PSYCHIATRIC:  Normal affect   ASSESSMENT:    1. Paroxysmal atrial fibrillation (HCC)    2. Encounter to discuss test results   3. Secondary hypercoagulable state ([place])   4. Current use of long term anticoagulation   5. Essential hypertension   6. Pure hypercholesterolemia    PLAN:    atrial fibrillation, paroxysmal: -has been in RVR on prior ECGs -CHA2DS2/VAS Stroke Risk Points=3 -continue apixaban 5 mg BID (only meets age criteria for reduction, not weight/Cr) -echo unremarkable -thyroid levels WNL -used KardiaMobile in office today to check rhythm. She is interested in this and will look into purchasing.  Hypertension: at goal today -continue triamterene-HCTZ, has had some issues with hypokalemia but being monitored by Dr. Rogers Blocker.  Hypercholesterolemia: -much improved on statin -continue rosuvastatin 10 mg daily  Cardiac risk counseling and prevention recommendations: -recommend heart healthy/Mediterranean diet, with whole grains, fruits, vegetable, fish, lean meats, nuts, and olive oil. Limit salt. -recommend moderate walking, 3-5 times/week for 30-50 minutes each session. Aim for at least 150 minutes.week. Goal should be pace of 3 miles/hours, or walking 1.5 miles in 30 minutes -recommend avoidance of tobacco products. Avoid excess alcohol. -ASCVD risk score: The  ASCVD Risk score Mikey Bussing DC Jr., et al., 2013) failed to calculate for the following reasons:   The 2013 ASCVD risk score is only valid for ages 82 to 83    Plan for follow up: 1 year or sooner as needed  Buford Dresser, MD, PhD, [facility]    Medication Adjustments/Labs and Tests Ordered: Current medicines are reviewed at length with the patient today.  Concerns regarding medicines are outlined above.  Orders Placed This Encounter  Procedures  . EKG 12-Lead   No orders of the defined types were placed in this encounter.   Patient Instructions  Medication Instructions:  Your Physician recommend you continue on your current medication as directed.    *If you need  a refill on your cardiac medications before your next appointment, please call your pharmacy*   Lab Work: None   Testing/Procedures: None   Follow-Up: At [facility], you and your health needs are our priority.  As part of our continuing mission to provide you with exceptional heart care, we have created designated Provider Care Teams.  These Care Teams include your primary Cardiologist (physician) and Advanced Practice Providers (APPs -  Physician Assistants and Nurse Practitioners) who all work together to provide you with the care you need, when you need it.  We recommend signing up for the patient portal called "MyChart".  Sign up information is provided on this After Visit Summary.  MyChart is used to connect with patients for Virtual Visits (Telemedicine).  Patients are able to view lab/test results, encounter notes, upcoming appointments, etc.  Non-urgent messages can be sent to your provider as well.   To learn more about what you can do with MyChart, go to [email or web address].    Your next appointment:   1 year(s) @ [address] [place], [place] [address]   The format for your next appointment:   In Person  Provider:   Buford Dresser, MD    [place]: Website: [email or web address]  DR. Harrell Gave RECOMMENDS YOU PURCHASE  " Kardia" [employer] FROM THE  [employer]  APP PLAY STORE.  THE APP IS FREE , BUT THE  EQUIPMENT HAS A COST. IT ALLOWS YOU TO OBTAIN A RECORDING OF YOUR HEART RATE AND RHYTHM BY PROVIDING A SHORT STRIP THAT YOU CAN SHARE WITH YOUR PROVIDER.         Signed, Buford Dresser, MD PhD 03/29/2021    [facility]

## 2021-04-06 ENCOUNTER — Ambulatory Visit (HOSPITAL_COMMUNITY): Payer: Medicare Other | Admitting: Physician Assistant

## 2021-04-06 NOTE — Telephone Encounter (Signed)
Called pt regarding mychart message. Pt state she doesn't feel like her heart is racing this morning but never could tell when she was in afib RVR. Pt report she still have a little SOB with exertion but hasn't checked rate/rhythm this morning with kardia mobile. Nurse contacted afib clinic to schedule an appointment but pt state she is currently waiting for EMS to arrive for her husband.  Nurse advised pt to also have EMS to evaluate her to make sure she is not currently in afib RVR.   Will route to MD to make aware.

## 2021-04-07 MED ORDER — METOPROLOL SUCCINATE ER 50 MG PO TB24: 50.0000 mg | ORAL_TABLET | Freq: Every day | ORAL | 3 refills | Status: DC

## 2021-04-07 NOTE — Telephone Encounter (Signed)
Spoke to pt and husband. She state her Jodelle Red mobile is showing she is back in afib and HR currently 138. Husband state pt is starting to be SOB again and have lack of energy.   Dr. Marisue Ivan made aware and recommended increasing metoprolol to 50 mg daily and schedule an appointment with APP.  Nurse informed pt if HR consistently stay elevated despite medication increase over weekend, to report to ER. Pt verbalized understanding.  Appointment scheduled for 5/11

## 2021-04-10 ENCOUNTER — Other Ambulatory Visit: Payer: Self-pay

## 2021-04-10 ENCOUNTER — Emergency Department (HOSPITAL_BASED_OUTPATIENT_CLINIC_OR_DEPARTMENT_OTHER): Payer: Medicare Other

## 2021-04-10 ENCOUNTER — Telehealth: Payer: Self-pay | Admitting: Family Medicine

## 2021-04-10 ENCOUNTER — Encounter (HOSPITAL_BASED_OUTPATIENT_CLINIC_OR_DEPARTMENT_OTHER): Payer: Self-pay | Admitting: Emergency Medicine

## 2021-04-10 ENCOUNTER — Emergency Department (HOSPITAL_BASED_OUTPATIENT_CLINIC_OR_DEPARTMENT_OTHER)
Admission: EM | Admit: 2021-04-10 | Discharge: 2021-04-10 | Disposition: A | Discharge status: Home / Self Care | Payer: Medicare Other | Attending: Emergency Medicine | Admitting: Emergency Medicine

## 2021-04-10 DIAGNOSIS — E039 Hypothyroidism, unspecified: Secondary | ICD-10-CM | POA: Insufficient documentation

## 2021-04-10 DIAGNOSIS — Z79899 Other long term (current) drug therapy: Secondary | ICD-10-CM | POA: Diagnosis not present

## 2021-04-10 DIAGNOSIS — I4891 Unspecified atrial fibrillation: Secondary | ICD-10-CM | POA: Insufficient documentation

## 2021-04-10 DIAGNOSIS — Z7901 Long term (current) use of anticoagulants: Secondary | ICD-10-CM | POA: Insufficient documentation

## 2021-04-10 DIAGNOSIS — I10 Essential (primary) hypertension: Secondary | ICD-10-CM | POA: Diagnosis not present

## 2021-04-10 DIAGNOSIS — R000 Tachycardia, unspecified: Secondary | ICD-10-CM | POA: Diagnosis present

## 2021-04-10 HISTORY — DX: Unspecified atrial fibrillation: I48.91

## 2021-04-10 LAB — CBC
HCT: 40.7 % (ref 36.0–46.0)
Hemoglobin: 13.7 g/dL (ref 12.0–15.0)
MCH: 30.2 pg (ref 26.0–34.0)
MCHC: 33.7 g/dL (ref 30.0–36.0)
MCV: 89.6 fL (ref 80.0–100.0)
Platelets: 230 10*3/uL (ref 150–400)
RBC: 4.54 MIL/uL (ref 3.87–5.11)
RDW: 13 % (ref 11.5–15.5)
WBC: 7.3 10*3/uL (ref 4.0–10.5)
nRBC: 0 % (ref 0.0–0.2)

## 2021-04-10 LAB — BASIC METABOLIC PANEL
Anion gap: 10 (ref 5–15)
BUN: 14 mg/dL (ref 8–23)
CO2: 26 mmol/L (ref 22–32)
Calcium: 9.1 mg/dL (ref 8.9–10.3)
Chloride: 101 mmol/L (ref 98–111)
Creatinine, Ser: 0.93 mg/dL (ref 0.44–1.00)
GFR, Estimated: >60 mL/min (ref >60)
Glucose, Bld: 134 mg/dL — ABNORMAL HIGH (ref 70–99)
Potassium: 3.4 mmol/L — ABNORMAL LOW (ref 3.5–5.1)
Sodium: 137 mmol/L (ref 135–145)

## 2021-04-10 LAB — TSH: TSH: 2.138 u[IU]/mL (ref 0.350–4.500)

## 2021-04-10 LAB — MAGNESIUM: Magnesium: 1.7 mg/dL (ref 1.7–2.4)

## 2021-04-10 MED ORDER — TRIAMTERENE-HCTZ 37.5-25 MG PO TABS: 1.0000 | ORAL_TABLET | Freq: Every day | ORAL | 0 refills | Status: DC

## 2021-04-10 MED ORDER — PROPOFOL 10 MG/ML IV BOLUS
INTRAVENOUS | Status: AC | PRN
  Administered 2021-04-10: 35 mg via INTRAVENOUS

## 2021-04-10 MED ORDER — PROPOFOL 10 MG/ML IV BOLUS
0.5000 mg/kg | Freq: Once | INTRAVENOUS | Status: DC
  Filled 2021-04-10: qty 20

## 2021-04-10 MED ORDER — SODIUM CHLORIDE 0.9 % IV BOLUS
500.0000 mL | Freq: Once | INTRAVENOUS | Status: AC
  Administered 2021-04-10: 500 mL via INTRAVENOUS

## 2021-04-10 NOTE — Discharge Instructions (Signed)
Keep your medications the same EXCEPT decrease your triamterene-hctz (maxide) in half.

## 2021-04-10 NOTE — Sedation Documentation (Signed)
Pt denies pain.

## 2021-04-10 NOTE — ED Provider Notes (Addendum)
[facility] EMERGENCY DEPT Provider Note   CSN: [id number] Arrival date & time: 04/10/21  1219     History Chief Complaint  Patient presents with  . Tachycardia    Paige Levy is a 80 y.o. female.  Pt presents to the ED today with rapid HR.  Pt was diagnosed with new onset afib about 2 months ago.  She did follow up with cardiology and had a cardioversion on 3/19.  Pt followed up with her cardiologist on 4/6.  She was in NSR then.  However, on 4/13, pt's husband called her cardiologist and told the cardiologist that she was back in afib.  She was told to increase her metoprolol to 50 mg and come to the ED if HR went over 100.  Today, her HR was 145, so she came to the ED.  Pt has been compliant with her Eliquis.  CHA2DS2/VAS Stroke Risk Points  Current as of 12 minutes ago     4 >= 2 Points: High Risk  1 - 1.99 Points: Medium Risk  0 Points: Low Risk    Last Change:       Details    This score determines the patient's risk of having a stroke if the  patient has atrial fibrillation.       Points Metrics  0 Has Congestive Heart Failure:  No    Current as of 12 minutes ago  0 Has Vascular Disease:  No    Current as of 12 minutes ago  1 Has Hypertension:  Yes    Current as of 12 minutes ago  2 Age:  8    Current as of 12 minutes ago  0 Has Diabetes:  No    Current as of 12 minutes ago  0 Had Stroke:  No  Had TIA:  No  Had Thromboembolism:  No    Current as of 12 minutes ago  1 Female:  Yes    Current as of 12 minutes ago                 Past Medical History:  Diagnosis Date  . Arthritis   . Atrial fibrillation ([place])   . Hyperlipemia   . Hypothyroidism   . Meniere's disease   . Thyroid disease     Patient Active Problem List   Diagnosis Date Noted  . Secondary hypercoagulable state ([place]) 03/29/2021  . Current use of long term anticoagulation 03/29/2021  . Essential hypertension 03/29/2021  . Pure hypercholesterolemia 03/29/2021  .  Atrial fibrillation ([place]) 02/08/2021  . Chronic rhinitis 04/27/2016  . Hearing loss 04/27/2016  . Active cochlear Meniere's disease, bilateral 01/17/2015  . Meniere's disease 10/01/2014  . Dyslipidemia 10/01/2014  . Hypothyroidism 11/09/2013  . Osteoarthritis 11/09/2013    Past Surgical History:  Procedure Laterality Date  . ABDOMINAL HYSTERECTOMY    . CARDIOVERSION N/A 03/21/2021   Procedure: CARDIOVERSION;  Surgeon: Sanda Klein, MD;  Location: [facility] ENDOSCOPY;  Service: Cardiovascular;  Laterality: N/A;  . ERCP Left 10/03/2014   Procedure: ENDOSCOPIC RETROGRADE CHOLANGIOPANCREATOGRAPHY (ERCP);  Surgeon: Missy Sabins, MD;  Location: [facility];  Service: Gastroenterology;  Laterality: Left;  . ERCP N/A 01/14/2015   Procedure: ENDOSCOPIC RETROGRADE CHOLANGIOPANCREATOGRAPHY (ERCP);  Surgeon: Jeryl Columbia, MD;  Location: [facility] ENDOSCOPY;  Service: Endoscopy;  Laterality: N/A;  need lithotripter/ordered/LH  . SPHINCTEROTOMY  09/2014  . SPYGLASS LITHOTRIPSY N/A 01/14/2015   Procedure: [id number] LITHOTRIPSY;  Surgeon: Jeryl Columbia, MD;  Location: [facility] ENDOSCOPY;  Service: Endoscopy;  Laterality: N/A;     OB History   No obstetric history on file.     Family History  Problem Relation Age of Onset  . Other Mother   . Heart attack Mother   . Other Father     Social History   Tobacco Use  . Smoking status: Never Smoker  . Smokeless tobacco: Never Used  Substance Use Topics  . Alcohol use: No  . Drug use: No    Home Medications Prior to Admission medications   Medication Sig Start Date End Date Taking? Authorizing Provider  triamterene-hydrochlorothiazide (MAXZIDE-25) 37.5-25 MG tablet Take 1 tablet by mouth daily. 04/10/21  Yes Isla Pence, MD  apixaban (ELIQUIS) 5 MG TABS tablet Take 1 tablet (5 mg total) by mouth 2 (two) times daily. 02/27/21   Buford Dresser, MD  levothyroxine (SYNTHROID) 88 MCG tablet TAKE 1 TABLET (88 MCG TOTAL) BY MOUTH DAILY BEFORE BREAKFAST.  01/18/21   Orma Flaming, MD  metoprolol succinate (TOPROL-XL) 50 MG 24 hr tablet Take 1 tablet (50 mg total) by mouth daily. Take with or immediately following a meal. 04/07/21 07/06/21  Buford Dresser, MD  Multiple Vitamin (MULTIVITAMIN WITH MINERALS) TABS tablet Take 2 tablets by mouth daily. Gummies    [provider]  naphazoline-glycerin (CLEAR EYES) 0.012-0.2 % SOLN Place 1-2 drops into both eyes every 4 (four) hours as needed for irritation.    [provider]  Potassium Gluconate 550 MG TABS Take 550 mg by mouth daily.    [provider]  rosuvastatin (CRESTOR) 10 MG tablet Take 1 tablet (10 mg total) by mouth daily. 12/27/20   Orma Flaming, MD    Allergies    Ursodiol  Review of Systems   Review of Systems  Cardiovascular: Positive for palpitations.  All other systems reviewed and are negative.   Physical Exam Updated Vital Signs BP (!) 108/57   Pulse (!) 58   Temp 98.5 F (36.9 C) (Oral)   Resp 18   Ht 5\' 5"  (1.651 m)   Wt 69.9 kg   SpO2 99%   BMI 25.63 kg/m   Physical Exam Vitals and nursing note reviewed.  Constitutional:      Appearance: Normal appearance.  HENT:     Head: Normocephalic and atraumatic.     Right Ear: External ear normal.     Left Ear: External ear normal.     Nose: Nose normal.     Mouth/Throat:     Mouth: Mucous membranes are moist.     Pharynx: Oropharynx is clear.  Eyes:     Extraocular Movements: Extraocular movements intact.     Conjunctiva/sclera: Conjunctivae normal.     Pupils: Pupils are equal, round, and reactive to light.  Cardiovascular:     Rate and Rhythm: Tachycardia present. Rhythm irregular.     Pulses: Normal pulses.     Heart sounds: Normal heart sounds.  Pulmonary:     Effort: Pulmonary effort is normal.     Breath sounds: Normal breath sounds.  Abdominal:     General: Abdomen is flat. Bowel sounds are normal.     Palpations: Abdomen is soft.  Musculoskeletal:        General:  Normal range of motion.     Cervical back: Normal range of motion and neck supple.  Skin:    General: Skin is warm.     Capillary Refill: Capillary refill takes less than 2 seconds.  Neurological:     General: No focal deficit present.  Mental Status: She is alert and oriented to person, place, and time.  Psychiatric:        Mood and Affect: Mood normal.        Behavior: Behavior normal.        Thought Content: Thought content normal.        Judgment: Judgment normal.     ED Results / Procedures / Treatments   Labs (all labs ordered are listed, but only abnormal results are displayed) Labs Reviewed  BASIC METABOLIC PANEL - Abnormal; Notable for the following components:      Result Value   Potassium 3.4 (*)    Glucose, Bld 134 (*)    All other components within normal limits  MAGNESIUM  CBC  TSH    EKG EKG Interpretation  Date/Time:  Monday April 10 2021 12:57:18 EDT Ventricular Rate:  75 PR Interval:  185 QRS Duration: 75 QT Interval:  379 QTC Calculation: 424 R Axis:   44 Text Interpretation: Sinus rhythm Abnormal R-wave progression, early transition NSR after cardioversion Confirmed by Isla Pence [phone]) on 04/10/2021 1:09:45 PM   Radiology DG Chest Port 1 View  Result Date: 04/10/2021 CLINICAL DATA:  Atrial fibrillation. EXAM: PORTABLE CHEST 1 VIEW COMPARISON:  October 01, 2014. FINDINGS: The heart size and mediastinal contours are within normal limits. Both lungs are clear. The visualized skeletal structures are unremarkable. IMPRESSION: No active disease. Electronically Signed   By: Marijo Conception M.D.   On: 04/10/2021 12:58    Procedures .Cardioversion  Date/Time: 04/10/2021 1:07 PM Performed by: Isla Pence, MD Authorized by: Isla Pence, MD   Consent:    Consent obtained:  Written   Consent given by:  Patient   Alternatives discussed:  No treatment Pre-procedure details:    Cardioversion basis:  Emergent   Rhythm:  Atrial  fibrillation   Electrode placement:  Anterior-posterior Patient sedated: Yes. Refer to sedation procedure documentation for details of sedation.  Attempt one:    Cardioversion mode:  Synchronous   Waveform:  Biphasic   Shock (Joules):  120   Shock outcome:  Conversion to normal sinus rhythm Post-procedure details:    Patient status:  Awake   Patient tolerance of procedure:  Tolerated well, no immediate complications .Sedation  Date/Time: 04/10/2021 1:07 PM Performed by: Isla Pence, MD Authorized by: Isla Pence, MD   Consent:    Consent obtained:  Written   Consent given by:  Patient Universal protocol:    Immediately prior to procedure, a time out was called: yes     Patient identity confirmed:  Verbally with patient Indications:    Procedure performed:  Cardioversion Pre-sedation assessment:    Time since last food or drink:  5   ASA classification: class 2 - patient with mild systemic disease     Mallampati score:  II - soft palate, uvula, fauces visible   Neck mobility: normal     Pre-sedation assessments completed and reviewed: airway patency, cardiovascular function, hydration status, mental status, nausea/vomiting, pain level, respiratory function and temperature   Immediate pre-procedure details:    Reassessment: Patient reassessed immediately prior to procedure     Reviewed: vital signs     Verified: bag valve mask available, emergency equipment available, intubation equipment available, IV patency confirmed, oxygen available and reversal medications available   Procedure details (see MAR for exact dosages):    Sedation:  Propofol   Intended level of sedation: deep   Intra-procedure monitoring:  Blood pressure monitoring, cardiac monitor, continuous capnometry,  continuous pulse oximetry, frequent LOC assessments and frequent vital sign checks   Intra-procedure events: none     Total Provider sedation time (minutes):  30 Post-procedure details:    Procedure  completion:  Tolerated well, no immediate complications Comments:     35 mg propofol given      Medications Ordered in ED Medications  propofol (DIPRIVAN) 10 mg/mL bolus/IV push 35 mg (35 mg Intravenous Not Given 04/10/21 1302)  propofol (DIPRIVAN) 10 mg/mL bolus/IV push (35 mg Intravenous Given 04/10/21 1254)  sodium chloride 0.9 % bolus 500 mL (500 mLs Intravenous New Bag/Given 04/10/21 1351)    ED Course  I have reviewed the triage vital signs and the nursing notes.  Pertinent labs & imaging results that were available during my care of the patient were reviewed by me and considered in my medical decision making (see chart for details).    MDM Rules/Calculators/A&P                         Pt tolerated cardioversion well.  She feels much better now.  BP is a little soft.  She was d/w Dr. Harrington Challenger who recommends keeping toprol the same and decreasing maxide in half.  Dr. Harrington Challenger will let Dr. Harrell Gave know about changes and need for cardioversion.  CRITICAL CARE Performed by: Isla Pence   Total critical care time: 30 minutes  Critical care time was exclusive of separately billable procedures and treating other patients.  Critical care was necessary to treat or prevent imminent or life-threatening deterioration.  Critical care was time spent personally by me on the following activities: development of treatment plan with patient and/or surrogate as well as nursing, discussions with consultants, evaluation of patient's response to treatment, examination of patient, obtaining history from patient or surrogate, ordering and performing treatments and interventions, ordering and review of laboratory studies, ordering and review of radiographic studies, pulse oximetry and re-evaluation of patient's condition.  Final Clinical Impression(s) / ED Diagnoses Final diagnoses:  Atrial fibrillation with RVR [facility])    Rx / DC Orders ED Discharge Orders         Ordered     triamterene-hydrochlorothiazide ([id number]) 37.5-25 MG tablet  Daily        04/10/21 1358           Isla Pence, MD 04/10/21 1359    Isla Pence, MD 04/18/21 1042

## 2021-04-10 NOTE — ED Notes (Signed)
Ambulate then d/c if ok   Isla Pence, MD 04/10/21 1510

## 2021-04-10 NOTE — ED Triage Notes (Signed)
Started to have fast heartbeat this am has been cardioverted before HR 140 some sob

## 2021-04-10 NOTE — Sedation Documentation (Signed)
Unable to assess pain due to sedation.  

## 2021-04-10 NOTE — Telephone Encounter (Signed)
I'm not really sure where to place this patient. Do you think I can take a DOD slot. The next available with a PA is several weeks out.

## 2021-04-10 NOTE — Telephone Encounter (Signed)
Left message for patient to call back and schedule Medicare Annual Wellness Visit (AWV) either virtually OR in office.   No hx; please schedule at anytime with [facility]-Nurse Health Advisor at [facility].  This should be a 45 minute visit.   

## 2021-04-10 NOTE — ED Notes (Addendum)
Pt discharged home with husband Ramaya Guile after verbalizing understanding of discharge instructions; nad noted.

## 2021-04-11 ENCOUNTER — Telehealth: Payer: Self-pay | Admitting: Cardiology

## 2021-04-11 NOTE — Telephone Encounter (Signed)
Spoke to pt's husband who report pt was seen in the ER yesterday for Afib RVR and was advised to schedule an appointment with Dr. Harrell Gave in 2 days. Advise pt that unfortunately no appointments available and asked if they would be ok with seeing Afib clinic. Pt agreeable and afib clinic notified. Appointment scheduled for tomorrow 4/20.

## 2021-04-11 NOTE — Telephone Encounter (Signed)
New Messasge:     Pt was seen in the ER yesterday at [facility] and was told to be seen in 2 days, there is no availability.

## 2021-04-12 ENCOUNTER — Other Ambulatory Visit: Payer: Self-pay

## 2021-04-12 ENCOUNTER — Encounter (HOSPITAL_COMMUNITY): Payer: Self-pay | Admitting: Nurse Practitioner

## 2021-04-12 ENCOUNTER — Ambulatory Visit (HOSPITAL_COMMUNITY)
Admission: RE | Admit: 2021-04-12 | Discharge: 2021-04-12 | Disposition: A | Discharge status: Home / Self Care | Payer: Medicare Other | Source: Ambulatory Visit | Attending: Nurse Practitioner | Admitting: Nurse Practitioner

## 2021-04-12 VITALS — BP 114/74 | HR 59 | Ht 65.0 in | Wt 157.4 lb

## 2021-04-12 DIAGNOSIS — I480 Paroxysmal atrial fibrillation: Secondary | ICD-10-CM | POA: Insufficient documentation

## 2021-04-12 DIAGNOSIS — Z7901 Long term (current) use of anticoagulants: Secondary | ICD-10-CM | POA: Diagnosis not present

## 2021-04-12 DIAGNOSIS — Z79890 Hormone replacement therapy (postmenopausal): Secondary | ICD-10-CM | POA: Insufficient documentation

## 2021-04-12 DIAGNOSIS — Z79899 Other long term (current) drug therapy: Secondary | ICD-10-CM | POA: Diagnosis not present

## 2021-04-12 DIAGNOSIS — E785 Hyperlipidemia, unspecified: Secondary | ICD-10-CM | POA: Insufficient documentation

## 2021-04-12 DIAGNOSIS — E039 Hypothyroidism, unspecified: Secondary | ICD-10-CM | POA: Diagnosis not present

## 2021-04-12 DIAGNOSIS — D6869 Other thrombophilia: Secondary | ICD-10-CM | POA: Diagnosis not present

## 2021-04-12 DIAGNOSIS — Z889 Allergy status to unspecified drugs, medicaments and biological substances status: Secondary | ICD-10-CM | POA: Insufficient documentation

## 2021-04-12 DIAGNOSIS — I10 Essential (primary) hypertension: Secondary | ICD-10-CM | POA: Insufficient documentation

## 2021-04-12 LAB — BASIC METABOLIC PANEL
Anion gap: 7 (ref 5–15)
BUN: 13 mg/dL (ref 8–23)
CO2: 30 mmol/L (ref 22–32)
Calcium: 8.9 mg/dL (ref 8.9–10.3)
Chloride: 100 mmol/L (ref 98–111)
Creatinine, Ser: 0.9 mg/dL (ref 0.44–1.00)
GFR, Estimated: >60 mL/min (ref >60)
Glucose, Bld: 95 mg/dL (ref 70–99)
Potassium: 3.8 mmol/L (ref 3.5–5.1)
Sodium: 137 mmol/L (ref 135–145)

## 2021-04-12 MED ORDER — DILTIAZEM HCL 30 MG PO TABS: ORAL_TABLET | ORAL | 1 refills | Status: DC

## 2021-04-12 MED ORDER — METOPROLOL SUCCINATE ER 25 MG PO TB24: 25.0000 mg | ORAL_TABLET | Freq: Every day | ORAL | 3 refills | Status: DC

## 2021-04-12 MED ORDER — MULTAQ 400 MG PO TABS
400.0000 mg | ORAL_TABLET | Freq: Two times a day (BID) | ORAL | 3 refills | Status: DC
Start: 2021-04-12 — End: 2021-07-10

## 2021-04-12 NOTE — Patient Instructions (Signed)
Cardizem 30mg  -- take 1 tablet every 4 hours AS NEEDED for heart rate >100 as long as top number of blood pressure >100.    If decide want to proceed with starting Multaq or Flecainide let our office know.

## 2021-04-13 ENCOUNTER — Encounter (HOSPITAL_COMMUNITY): Payer: Self-pay | Admitting: Nurse Practitioner

## 2021-04-13 NOTE — Progress Notes (Signed)
Primary Care Physician: Orma Flaming, MD Referring Physician: Dr. Venida Jarvis    Paige Levy is a 80 y.o. female, originally form [place],  with a h/o  hx of hypothyroidism, hyperlipidemia, hypertension, paroxysmal atrial fibrillation that is in the afib clinic, for recent cardioversion in the ER. She was found to have new onset afib several months ago. She had a cardioversion 03/21/21 with return to [place] but then return to afib with RVR and presented to the ER, 04/10/21.  She has a Jodelle Red that she uses at home and if she does not use this is not aware when she is in afib. She usually runs a soft BP which makes up titration of oral drugs difficult. She does not use alcohol, caffeine or tobacco. Some caffeine use. Labs show that her potassium was slightly low in the ER and was not replaced. She is on HCTZ and OTC K+ supplementation.   Today, she denies symptoms of palpitations, chest pain, shortness of breath, orthopnea, PND, lower extremity edema, dizziness, presyncope, syncope, or neurologic sequela. The patient is tolerating medications without difficulties and is otherwise without complaint today.   Past Medical History:  Diagnosis Date  . Arthritis   . Atrial fibrillation ([place])   . Hyperlipemia   . Hypothyroidism   . Meniere's disease   . Thyroid disease    Past Surgical History:  Procedure Laterality Date  . ABDOMINAL HYSTERECTOMY    . CARDIOVERSION N/A 03/21/2021   Procedure: CARDIOVERSION;  Surgeon: Sanda Klein, MD;  Location: [facility] ENDOSCOPY;  Service: Cardiovascular;  Laterality: N/A;  . ERCP Left 10/03/2014   Procedure: ENDOSCOPIC RETROGRADE CHOLANGIOPANCREATOGRAPHY (ERCP);  Surgeon: Missy Sabins, MD;  Location: [place];  Service: Gastroenterology;  Laterality: Left;  . ERCP N/A 01/14/2015   Procedure: ENDOSCOPIC RETROGRADE CHOLANGIOPANCREATOGRAPHY (ERCP);  Surgeon: Jeryl Columbia, MD;  Location[facility] END[facility]COPY;  Service: Endoscopy;  Laterality: N/A;  need  lithotripter/ordered/LH  . SPHINCTEROTOMY  09/2014  . SPYGLASS LITHOTRIPSY N/A 01/14/2015   Procedure: HQIONGEX LITHOTRIPSY;  Surgeon: Jeryl Columbia, MD;  Location: [facility] ENDOSCOPY;  Service: Endoscopy;  Laterality: N/A;    Current Outpatient Medications  Medication Sig Dispense Refill  . apixaban (ELIQUIS) 5 MG TABS tablet Take 1 tablet (5 mg total) by mouth 2 (two) times daily. 90 tablet 3  . diltiazem (CARDIZEM) 30 MG tablet Take 1 tablet every 4 hours AS NEEDED for heart rate >100 as long as top BP >100. 30 tablet 1  . dronedarone (MULTAQ) 400 MG tablet Take 1 tablet (400 mg total) by mouth 2 (two) times daily with a meal. 60 tablet 3  . levothyroxine (SYNTHROID) 88 MCG tablet TAKE 1 TABLET (88 MCG TOTAL) BY MOUTH DAILY BEFORE BREAKFAST. 90 tablet 1  . Multiple Vitamin (MULTIVITAMIN WITH MINERALS) TABS tablet Take 2 tablets by mouth daily. Gummies    . naphazoline-glycerin (CLEAR EYES) 0.012-0.2 % SOLN Place 1-2 drops into both eyes every 4 (four) hours as needed for irritation.    . Potassium Gluconate 550 MG TABS Take 550 mg by mouth daily.    . rosuvastatin (CRESTOR) 10 MG tablet Take 1 tablet (10 mg total) by mouth daily. 90 tablet 3  . triamterene-hydrochlorothiazide (MAXZIDE-25) 37.5-25 MG tablet Take 1 tablet by mouth daily. 30 tablet 0  . metoprolol succinate (TOPROL-XL) 25 MG 24 hr tablet Take 1 tablet (25 mg total) by mouth daily. Take with or immediately following a meal. 90 tablet 3   No current facility-administered medications for this encounter.  Allergies  Allergen Reactions  . Ursodiol Other (See Comments)    Severe Dizziness    Social History   Socioeconomic History  . Marital status: Married    Spouse name: Not on file  . Number of children: Not on file  . Years of education: Not on file  . Highest education level: Not on file  Occupational History  . Not on file  Tobacco Use  . Smoking status: Never Smoker  . Smokeless tobacco: Never Used  Substance and  Sexual Activity  . Alcohol use: No  . Drug use: No  . Sexual activity: Never  Other Topics Concern  . Not on file  Social History Narrative  . Not on file   Social Determinants of Health   Financial Resource Strain: Not on file  Food Insecurity: Not on file  Transportation Needs: Not on file  Physical Activity: Not on file  Stress: Not on file  Social Connections: Not on file  Intimate Partner Violence: Not on file    Family History  Problem Relation Age of Onset  . Other Mother   . Heart attack Mother   . Other Father     ROS- All systems are reviewed and negative except as per the HPI above  Physical Exam: Vitals:   04/12/21 1137  BP: 114/74  Pulse: (!) 59  Weight: 71.4 kg  Height: 5\' 5"  (1.651 m)   Wt Readings from Last 3 Encounters:  04/12/21 71.4 kg  04/10/21 69.9 kg  03/29/21 70.9 kg    Labs: Lab Results  Component Value Date   NA 137 04/12/2021   K 3.8 04/12/2021   CL 100 04/12/2021   CO2 30 04/12/2021   GLUCOSE 95 04/12/2021   BUN 13 04/12/2021   CREATININE 0.90 04/12/2021   CALCIUM 8.9 04/12/2021   MG 1.7 04/10/2021   Lab Results  Component Value Date   INR 0.97 01/14/2015   Lab Results  Component Value Date   CHOL 151 02/08/2021   HDL 57.50 02/08/2021   LDLCALC 72 02/08/2021   TRIG 108.0 02/08/2021     GEN- The patient is well appearing, alert and oriented x 3 today.   Head- normocephalic, atraumatic Eyes-  Sclera clear, conjunctiva pink Ears- hearing intact Oropharynx- clear Neck- supple, no JVP Lymph- no cervical lymphadenopathy Lungs- Clear to ausculation bilaterally, normal work of breathing Heart- Regular rate and rhythm, no murmurs, rubs or gallops, PMI not laterally displaced GI- soft, NT, ND, + BS Extremities- no clubbing, cyanosis, or edema MS- no significant deformity or atrophy Skin- no rash or lesion Psych- euthymic mood, full affect Neuro- strength and sensation are intact  EKG-Sinus brady at 59 bpm Epic  records reviewed   Echo-1. Left ventricular ejection fraction, by estimation, is 55 to 60%. The left ventricle has normal function. The left ventricle has no regional wall motion abnormalities. Left ventricular diastolic parameters are indeterminate. 2. Right ventricular systolic function is normal. The right ventricular size is normal. 3. The mitral valve is normal in structure. Mild mitral valve regurgitation. No evidence of mitral stenosis. 4. The aortic valve is tricuspid. Aortic valve regurgitation is not visualized. Mild to moderate aortic valve sclerosis/calcification is present, without any evidence of aortic stenosis. 5. The inferior vena cava is normal in size with greater than 50% respiratory variability, suggesting right atrial pressure of 3 mmHg.  Assessment and Plan: 1. Paroxysmal  afib  General education re afib and triggers  Pt has had 2 cardioversion's, just  weeks  apart  Soft BP's and slow HR at baseline make it difficult to up titrate rate control drugs I discussed use of antiarrythmic's with pt and husband  I offered Multaq( normal LV function, no liver issues, normal qt and no qt prolonging drugs on board )  or Flecainide (would need stress test before starting)  After discussion and checking price of drug, pt would like to try Multaq 400 mg bid  She will decrease metoprolol to half dose as she start Multaq Make sure to take with food to prevent GI upset I also gave rx for cardizem 30 mg  to have at home for breakthrough afib   2. CHA2DS2VASc score of 4 Continue  eliquis 5 mg bid   I will see back 7-10 days after start of [place]. Jocelyne Reinertsen, [facility] [address] [place], [place] [address] [phone]

## 2021-04-19 ENCOUNTER — Ambulatory Visit (HOSPITAL_COMMUNITY)
Admission: RE | Admit: 2021-04-19 | Discharge: 2021-04-19 | Disposition: A | Discharge status: Home / Self Care | Payer: Medicare Other | Source: Ambulatory Visit | Attending: Nurse Practitioner | Admitting: Nurse Practitioner

## 2021-04-19 ENCOUNTER — Other Ambulatory Visit: Payer: Self-pay

## 2021-04-19 VITALS — BP 106/70 | HR 62

## 2021-04-19 DIAGNOSIS — I480 Paroxysmal atrial fibrillation: Secondary | ICD-10-CM | POA: Insufficient documentation

## 2021-04-19 DIAGNOSIS — D6869 Other thrombophilia: Secondary | ICD-10-CM | POA: Diagnosis not present

## 2021-04-19 NOTE — Progress Notes (Signed)
Pt in for EKG on  Multaq 400 mg bid. She is taking with food and she has not experienced any further afib, but has noted diarrhea in the am. She also has also noted mild H/A. She  has just been on drug x 5 days so will give her a little more time to get use to drug. If symptoms persist, when she sees Dr. Harrell Gave 5/11, then drug can be d/c, stress test obtained and started on flecainide 50 mg bid. EKG today with NSR, normal EKG. 62 bpm. qtc stable at 450 ms. If stays on drug, liver panel on f/u with Dr. Harrell Gave.

## 2021-04-27 ENCOUNTER — Encounter (HOSPITAL_BASED_OUTPATIENT_CLINIC_OR_DEPARTMENT_OTHER): Payer: Self-pay

## 2021-04-27 ENCOUNTER — Emergency Department (HOSPITAL_BASED_OUTPATIENT_CLINIC_OR_DEPARTMENT_OTHER): Payer: Medicare Other | Admitting: Radiology

## 2021-04-27 ENCOUNTER — Telehealth: Payer: Self-pay | Admitting: *Deleted

## 2021-04-27 ENCOUNTER — Telehealth: Payer: Self-pay | Admitting: Cardiology

## 2021-04-27 ENCOUNTER — Other Ambulatory Visit: Payer: Self-pay

## 2021-04-27 ENCOUNTER — Emergency Department (HOSPITAL_BASED_OUTPATIENT_CLINIC_OR_DEPARTMENT_OTHER)
Admission: EM | Admit: 2021-04-27 | Discharge: 2021-04-28 | Disposition: A | Discharge status: Home / Self Care | Payer: Medicare Other | Attending: Emergency Medicine | Admitting: Emergency Medicine

## 2021-04-27 DIAGNOSIS — I4891 Unspecified atrial fibrillation: Secondary | ICD-10-CM | POA: Insufficient documentation

## 2021-04-27 DIAGNOSIS — E039 Hypothyroidism, unspecified: Secondary | ICD-10-CM | POA: Insufficient documentation

## 2021-04-27 DIAGNOSIS — R531 Weakness: Secondary | ICD-10-CM | POA: Insufficient documentation

## 2021-04-27 DIAGNOSIS — R5383 Other fatigue: Secondary | ICD-10-CM | POA: Insufficient documentation

## 2021-04-27 DIAGNOSIS — Z7901 Long term (current) use of anticoagulants: Secondary | ICD-10-CM | POA: Insufficient documentation

## 2021-04-27 DIAGNOSIS — I10 Essential (primary) hypertension: Secondary | ICD-10-CM | POA: Insufficient documentation

## 2021-04-27 DIAGNOSIS — N390 Urinary tract infection, site not specified: Secondary | ICD-10-CM

## 2021-04-27 DIAGNOSIS — R251 Tremor, unspecified: Secondary | ICD-10-CM | POA: Diagnosis not present

## 2021-04-27 DIAGNOSIS — Z20822 Contact with and (suspected) exposure to covid-19: Secondary | ICD-10-CM | POA: Insufficient documentation

## 2021-04-27 DIAGNOSIS — E876 Hypokalemia: Secondary | ICD-10-CM

## 2021-04-27 DIAGNOSIS — Z79899 Other long term (current) drug therapy: Secondary | ICD-10-CM | POA: Diagnosis not present

## 2021-04-27 LAB — BASIC METABOLIC PANEL
Anion gap: 11 (ref 5–15)
BUN: 13 mg/dL (ref 8–23)
CO2: 26 mmol/L (ref 22–32)
Calcium: 8.9 mg/dL (ref 8.9–10.3)
Chloride: 99 mmol/L (ref 98–111)
Creatinine, Ser: 0.86 mg/dL (ref 0.44–1.00)
GFR, Estimated: >60 mL/min (ref >60)
Glucose, Bld: 139 mg/dL — ABNORMAL HIGH (ref 70–99)
Potassium: 3 mmol/L — ABNORMAL LOW (ref 3.5–5.1)
Sodium: 136 mmol/L (ref 135–145)

## 2021-04-27 LAB — CBC
HCT: 36.9 % (ref 36.0–46.0)
Hemoglobin: 12.3 g/dL (ref 12.0–15.0)
MCH: 30.1 pg (ref 26.0–34.0)
MCHC: 33.3 g/dL (ref 30.0–36.0)
MCV: 90.4 fL (ref 80.0–100.0)
Platelets: 156 10*3/uL (ref 150–400)
RBC: 4.08 MIL/uL (ref 3.87–5.11)
RDW: 13.2 % (ref 11.5–15.5)
WBC: 10.7 10*3/uL — ABNORMAL HIGH (ref 4.0–10.5)
nRBC: 0 % (ref 0.0–0.2)

## 2021-04-27 LAB — URINALYSIS, ROUTINE W REFLEX MICROSCOPIC
Bilirubin Urine: NEGATIVE
Glucose, UA: NEGATIVE mg/dL
Ketones, ur: NEGATIVE mg/dL
Nitrite: NEGATIVE
Protein, ur: NEGATIVE mg/dL
Specific Gravity, Urine: 1.006 (ref 1.005–1.030)
WBC, UA: >50 WBC/hpf — ABNORMAL HIGH (ref 0–5)
pH: 6.5 (ref 5.0–8.0)

## 2021-04-27 LAB — RESP PANEL BY RT-PCR (FLU A&B, COVID) ARPGX2
Influenza A by PCR: NEGATIVE
Influenza B by PCR: NEGATIVE
SARS Coronavirus 2 by RT PCR: NEGATIVE

## 2021-04-27 LAB — CBG MONITORING, ED: Glucose-Capillary: 119 mg/dL — ABNORMAL HIGH (ref 70–99)

## 2021-04-27 LAB — TROPONIN I (HIGH SENSITIVITY)
Troponin I (High Sensitivity): 10 ng/L (ref <18)
Troponin I (High Sensitivity): 10 ng/L (ref <18)

## 2021-04-27 LAB — MAGNESIUM: Magnesium: 1.8 mg/dL (ref 1.7–2.4)

## 2021-04-27 MED ORDER — SODIUM CHLORIDE 0.9 % IV SOLN
1.0000 g | Freq: Once | INTRAVENOUS | Status: AC
  Administered 2021-04-27: 1 g via INTRAVENOUS
  Filled 2021-04-27: qty 10

## 2021-04-27 MED ORDER — CEPHALEXIN 500 MG PO CAPS: 500.0000 mg | ORAL_CAPSULE | Freq: Two times a day (BID) | ORAL | 0 refills | Status: AC

## 2021-04-27 MED ORDER — POTASSIUM CHLORIDE CRYS ER 20 MEQ PO TBCR
40.0000 meq | EXTENDED_RELEASE_TABLET | Freq: Once | ORAL | Status: AC
  Administered 2021-04-27: 40 meq via ORAL
  Filled 2021-04-27: qty 2

## 2021-04-27 NOTE — ED Provider Notes (Signed)
[facility] EMERGENCY DEPT Provider Note   CSN: [id number] Arrival date & time: 04/27/21  2011     History Chief Complaint  Patient presents with  . Weakness    Paige Levy is a 80 y.o. female with a history of paroxysmal A. fib on Eliquis, Cardizem, multaq, hypothyroidism, hyperlipidemia, presenting to emergency department with tremors and fatigue.  The patient reports that she was in her usual state of health up until this morning.  She states that if she woke up she had an episode where she had uncontrollable shaking of her bilateral hands lasting about 2 to 3 hours.  She checked her home EKG telemetry monitoring her heart rate was 90.  There is no alarm for A. fib.  The shaking symptoms since resolved but she reports feeling very fatigued all day.  She denies chest pain, palpitations, shortness of breath.  She denies abdominal pain.  She denies dysuria or hematuria.  She has a history of 1 UTI in the past.  She has a history of a cholecystectomy.  She has had 3 doses of the COVID-vaccine.  She denies cough, congestion, shortness of breath, sore throat.  She also has a history of chronic hypokalemia and takes potassium at baseline.  She denies any cardiac history.  Denies history of smoking, hypertension, diabetes.  HPI     Past Medical History:  Diagnosis Date  . Arthritis   . Atrial fibrillation ([place])   . Hyperlipemia   . Hypothyroidism   . Meniere's disease   . Thyroid disease     Patient Active Problem List   Diagnosis Date Noted  . Secondary hypercoagulable state ([place]) 03/29/2021  . Current use of long term anticoagulation 03/29/2021  . Essential hypertension 03/29/2021  . Pure hypercholesterolemia 03/29/2021  . Atrial fibrillation ([place]) 02/08/2021  . Chronic rhinitis 04/27/2016  . Hearing loss 04/27/2016  . Active cochlear Meniere's disease, bilateral 01/17/2015  . Meniere's disease 10/01/2014  . Dyslipidemia 10/01/2014  . Hypothyroidism  11/09/2013  . Osteoarthritis 11/09/2013    Past Surgical History:  Procedure Laterality Date  . ABDOMINAL HYSTERECTOMY    . CARDIOVERSION N/A 03/21/2021   Procedure: CARDIOVERSION;  Surgeon: Sanda Klein, MD;  Location: [facility] ENDOSCOPY;  Service: Cardiovascular;  Laterality: N/A;  . ERCP Left 10/03/2014   Procedure: ENDOSCOPIC RETROGRADE CHOLANGIOPANCREATOGRAPHY (ERCP);  Surgeon: Missy Sabins, MD;  Location: [place];  Service: Gastroenterology;  Laterality: Left;  . ERCP N/A 01/14/2015   Procedure: ENDOSCOPIC RETROGRADE CHOLANGIOPANCREATOGRAPHY (ERCP);  Surgeon: Jeryl Columbia, MD;  Location: [facility] ENDOSCOPY;  Service: Endoscopy;  Laterality: N/A;  need lithotripter/ordered/LH  . SPHINCTEROTOMY  09/2014  . SPYGLASS LITHOTRIPSY N/A 01/14/2015   Procedure: CZYSAYTK LITHOTRIPSY;  Surgeon: Jeryl Columbia, MD;  Location[facility] ENDOSCOPY;  Service: Endoscopy;  Laterality: N/A;     OB History   No obstetric history on file.     Family History  Problem Relation Age of Onset  . Other Mother   . Heart attack Mother   . Other Father     Social History   Tobacco Use  . Smoking status: Never Smoker  . Smokeless tobacco: Never Used  Substance Use Topics  . Alcohol use: No  . Drug use: No    Home Medications Prior to Admission medications   Medication Sig Start Date End Date Taking? Authorizing Provider  apixaban (ELIQUIS) 5 MG TABS tablet Take 1 tablet (5 mg total) by mouth 2 (two) times daily. 02/27/21   Buford Dresser, MD  diltiazem (CARDIZEM) 30 MG tablet Take 1 tablet every 4 hours AS NEEDED for heart rate >100 as long as top BP >100. 04/12/21   Sherran Needs, NP  dronedarone (MULTAQ) 400 MG tablet Take 1 tablet (400 mg total) by mouth 2 (two) times daily with a meal. 04/12/21   Sherran Needs, NP  levothyroxine (SYNTHROID) 88 MCG tablet TAKE 1 TABLET (88 MCG TOTAL) BY MOUTH DAILY BEFORE BREAKFAST. 01/18/21   Orma Flaming, MD  metoprolol succinate (TOPROL-XL) 25 MG 24 hr tablet  Take 1 tablet (25 mg total) by mouth daily. Take with or immediately following a meal. 04/12/21 07/11/21  Sherran Needs, NP  Multiple Vitamin (MULTIVITAMIN WITH MINERALS) TABS tablet Take 2 tablets by mouth daily. Gummies    [provider]  naphazoline-glycerin (CLEAR EYES) 0.012-0.2 % SOLN Place 1-2 drops into both eyes every 4 (four) hours as needed for irritation.    [provider]  Potassium Gluconate 550 MG TABS Take 550 mg by mouth daily.    [provider]  rosuvastatin (CRESTOR) 10 MG tablet Take 1 tablet (10 mg total) by mouth daily. 12/27/20   Orma Flaming, MD  triamterene-hydrochlorothiazide (MAXZIDE-25) 37.5-25 MG tablet Take 1 tablet by mouth daily. 04/10/21   Isla Pence, MD    Allergies    Ursodiol  Review of Systems   Review of Systems  Constitutional: Positive for fatigue. Negative for chills and fever.  HENT: Negative for ear pain and sore throat.   Eyes: Negative for pain and visual disturbance.  Respiratory: Negative for cough and shortness of breath.   Cardiovascular: Negative for chest pain and palpitations.  Gastrointestinal: Negative for abdominal pain and vomiting.  Genitourinary: Negative for dysuria and hematuria.  Musculoskeletal: Negative for arthralgias and back pain.  Skin: Negative for color change and rash.  Neurological: Positive for tremors. Negative for seizures, syncope, facial asymmetry, speech difficulty, light-headedness and headaches.  All other systems reviewed and are negative.   Physical Exam Updated Vital Signs BP 125/64 (BP Location: Left Arm)   Pulse 62   Temp 98.8 F (37.1 C) (Oral)   Resp 18   Ht 5\' 5"  (1.651 m)   Wt 71.4 kg   SpO2 98%   BMI 26.19 kg/m   Physical Exam Constitutional:      General: She is not in acute distress. HENT:     Head: Normocephalic and atraumatic.  Eyes:     Conjunctiva/sclera: Conjunctivae normal.     Pupils: Pupils are equal, round, and reactive to light.   Cardiovascular:     Rate and Rhythm: Normal rate and regular rhythm.     Pulses: Normal pulses.  Pulmonary:     Effort: Pulmonary effort is normal. No respiratory distress.  Abdominal:     General: There is no distension.     Tenderness: There is no abdominal tenderness. There is no guarding.  Musculoskeletal:        General: Normal range of motion.  Skin:    General: Skin is warm and dry.  Neurological:     General: No focal deficit present.     Mental Status: She is alert and oriented to person, place, and time. Mental status is at baseline.     Sensory: No sensory deficit.     Motor: No weakness.     Comments: No tremors, no asterixes  Psychiatric:        Mood and Affect: Mood normal.        Behavior: Behavior  normal.     ED Results / Procedures / Treatments   Labs (all labs ordered are listed, but only abnormal results are displayed) Labs Reviewed  BASIC METABOLIC PANEL - Abnormal; Notable for the following components:      Result Value   Potassium 3.0 (*)    Glucose, Bld 139 (*)    All other components within normal limits  CBC - Abnormal; Notable for the following components:   WBC 10.7 (*)    All other components within normal limits  RESP PANEL BY RT-PCR (FLU A&B, COVID) ARPGX2  URINALYSIS, ROUTINE W REFLEX MICROSCOPIC  MAGNESIUM  CBG MONITORING, ED  TROPONIN I (HIGH SENSITIVITY)  TROPONIN I (HIGH SENSITIVITY)    EKG EKG Interpretation  Date/Time:  Thursday Apr 27 2021 20:46:47 EDT Ventricular Rate:  76 PR Interval:  184 QRS Duration: 86 QT Interval:  408 QTC Calculation: 459 R Axis:   20 Text Interpretation: Normal sinus rhythm Normal ECG Confirmed by Octaviano Glow [phone]) on 04/27/2021 9:09:43 PM   Radiology DG Chest 2 View  Result Date: 04/27/2021 CLINICAL DATA:  Weakness. EXAM: CHEST - 2 VIEW COMPARISON:  April 10, 2021 FINDINGS: The lungs are hyperinflated. Chronic appearing increased interstitial lung markings are seen. There is no evidence  of acute infiltrate, pleural effusion or pneumothorax. The heart size and mediastinal contours are within normal limits. Radiopaque surgical clips are seen overlying the right upper quadrant. The visualized skeletal structures are unremarkable. IMPRESSION: No active cardiopulmonary disease. Electronically Signed   By: Virgina Norfolk M.D.   On: 04/27/2021 22:14    Procedures Procedures   Medications Ordered in ED Medications  potassium chloride SA (KLOR-CON) CR tablet 40 mEq (has no administration in time range)    ED Course  I have reviewed the triage vital signs and the nursing notes.  Pertinent labs & imaging results that were available during my care of the patient were reviewed by me and considered in my medical decision making (see chart for details).  This patient complains of tremors, fatigue.  This involves an extensive number of treatment options, and is a complaint that carries with it a high risk of complications and morbidity.  The differential diagnosis includes infection vs dehydration vs arrhythmia vs medical side effect vs other.  No signs or symptoms of stroke at this time.  Low suspicion for stroke.  EKG on arrival personally reviewed.  This shows normal sinus rhythm, no active A. fib, no acute ischemic findings.  Troponin 10 drawn over 6 hours after symptom onset.  Unlikely ACS.  Doubt PE.  Labs otherwise reviewed.  White blood cell count 10.7.  No acute anemia with hemoglobin of 12.3.  BMP shows baseline chronic hypokalemia with a potassium of 3.0, otherwise calcium is normal and no evidence of significant dehydration.  UA is pending.  Additional history provided by husband at bedside.  We will add on a magnesium level, follow-up on the UA, also swab for COVID. P.o. potassium ordered.  Signed out to Dr Florina Ou EDP with plan to f/u on mag, UA results, and reassess patient.     Final Clinical Impression(s) / ED Diagnoses Final diagnoses:  None    Rx / DC  Orders ED Discharge Orders    None       Jahlani Lorentz, Carola Rhine, MD 04/28/21 [phone]

## 2021-04-27 NOTE — ED Provider Notes (Signed)
Nursing notes and vitals signs, including pulse oximetry, reviewed.  Summary of this visit's results, reviewed by myself:  EKG:  EKG Interpretation  Date/Time:  Thursday Apr 27 2021 20:46:47 EDT Ventricular Rate:  76 PR Interval:  184 QRS Duration: 86 QT Interval:  408 QTC Calculation: 459 R Axis:   20 Text Interpretation: Normal sinus rhythm Normal ECG Confirmed by Octaviano Glow [phone]) on 04/27/2021 9:09:43 PM       Labs:  Results for orders placed or performed during the hospital encounter of 04/27/21 (from the past 24 hour(s))  Basic metabolic panel     Status: Abnormal   Collection Time: 04/27/21  8:55 PM  Result Value Ref Range   Sodium 136 135 - 145 mmol/L   Potassium 3.0 (L) 3.5 - 5.1 mmol/L   Chloride 99 98 - 111 mmol/L   CO2 26 22 - 32 mmol/L   Glucose, Bld 139 (H) 70 - 99 mg/dL   BUN 13 8 - 23 mg/dL   Creatinine, Ser 0.86 0.44 - 1.00 mg/dL   Calcium 8.9 8.9 - 10.3 mg/dL   GFR, Estimated >60 >60 mL/min   Anion gap 11 5 - 15  CBC     Status: Abnormal   Collection Time: 04/27/21  8:55 PM  Result Value Ref Range   WBC 10.7 (H) 4.0 - 10.5 K/uL   RBC 4.08 3.87 - 5.11 MIL/uL   Hemoglobin 12.3 12.0 - 15.0 g/dL   HCT 36.9 36.0 - 46.0 %   MCV 90.4 80.0 - 100.0 fL   MCH 30.1 26.0 - 34.0 pg   MCHC 33.3 30.0 - 36.0 g/dL   RDW 13.2 11.5 - 15.5 %   Platelets 156 150 - 400 K/uL   nRBC 0.0 0.0 - 0.2 %  Troponin I (High Sensitivity)     Status: None   Collection Time: 04/27/21  8:55 PM  Result Value Ref Range   Troponin I (High Sensitivity) 10 <18 ng/L  Resp Panel by RT-PCR (Flu A&B, Covid) Nasopharyngeal Swab     Status: None   Collection Time: 04/27/21 10:51 PM   Specimen: Nasopharyngeal Swab; Nasopharyngeal(NP) swabs in vial transport medium  Result Value Ref Range   SARS Coronavirus 2 by RT PCR NEGATIVE NEGATIVE   Influenza A by PCR NEGATIVE NEGATIVE   Influenza B by PCR NEGATIVE NEGATIVE  CBG monitoring, ED     Status: Abnormal   Collection Time: 04/27/21  11:05 PM  Result Value Ref Range   Glucose-Capillary 119 (H) 70 - 99 mg/dL  Urinalysis, Routine w reflex microscopic Urine, Clean Catch     Status: Abnormal   Collection Time: 04/27/21 11:10 PM  Result Value Ref Range   Color, Urine YELLOW YELLOW   APPearance CLEAR CLEAR   Specific Gravity, Urine 1.006 1.005 - 1.030   pH 6.5 5.0 - 8.0   Glucose, UA NEGATIVE NEGATIVE mg/dL   Hgb urine dipstick MODERATE (A) NEGATIVE   Bilirubin Urine NEGATIVE NEGATIVE   Ketones, ur NEGATIVE NEGATIVE mg/dL   Protein, ur NEGATIVE NEGATIVE mg/dL   Nitrite NEGATIVE NEGATIVE   Leukocytes,Ua LARGE (A) NEGATIVE   RBC / HPF 0-5 0 - 5 RBC/hpf   WBC, UA >50 (H) 0 - 5 WBC/hpf   Bacteria, UA RARE (A) NONE SEEN   Squamous Epithelial / LPF 0-5 0 - 5  Troponin I (High Sensitivity)     Status: None   Collection Time: 04/27/21 11:10 PM  Result Value Ref Range   Troponin I (High  Sensitivity) 10 <18 ng/L  Magnesium     Status: None   Collection Time: 04/27/21 11:10 PM  Result Value Ref Range   Magnesium 1.8 1.7 - 2.4 mg/dL    Imaging Studies: DG Chest 2 View  Result Date: 04/27/2021 CLINICAL DATA:  Weakness. EXAM: CHEST - 2 VIEW COMPARISON:  April 10, 2021 FINDINGS: The lungs are hyperinflated. Chronic appearing increased interstitial lung markings are seen. There is no evidence of acute infiltrate, pleural effusion or pneumothorax. The heart size and mediastinal contours are within normal limits. Radiopaque surgical clips are seen overlying the right upper quadrant. The visualized skeletal structures are unremarkable. IMPRESSION: No active cardiopulmonary disease. Electronically Signed   By: Virgina Norfolk M.D.   On: 04/27/2021 22:14   Patient given Rocephin 1 g IV for urinary tract infection and we will discharge her home on Keflex.     Shanon Rosser, MD 04/27/21 2355

## 2021-04-27 NOTE — ED Notes (Signed)
RT obtained EKG on pt. Results to MD.

## 2021-04-27 NOTE — Telephone Encounter (Signed)
Spoke with pt and husband, the patient got up fine but then began to feel cold and had shivering in her arms. She felt warm to the touch and does not have a fever. She ate and took her medications and currently feels good except she is tired. They did a lot outside yesterday and she was also tired last night. It lasted less than 30 minutes and there were no other symptoms. This morning her heart rate on the kardia device was 67 bpm, during the episode it was 83 bpm and now is 81 bpm. Advised not sure what that was related to, she has an appointment Wednesday next week and will continue to follow and call prior to that appointment if symptoms change or continue.

## 2021-04-27 NOTE — Telephone Encounter (Signed)
Left message for pt to call.

## 2021-04-27 NOTE — ED Triage Notes (Signed)
Patient here POV from Home with Weakness.  Patient had recent Cardioversion completed for A. Fib. 3 weeks PTA. No problems since.  Patient awoke this AM with Shakiness to Upper Extremities and Weakness.  No CP, No SOB, Ambulatory, GCS 15

## 2021-04-27 NOTE — Telephone Encounter (Signed)
Spoke to pt c/o shivering and body aches arms and legs. Pt said she tried to call Cardiollgy but could not get through. Asked pt if having chest pain or SOB or can tell if her heart is skipping beats? Pt denies chest pain or SOB, she said she can not tell if she is having A-fib. Told pt let me see if Dr. Rogers Blocker wants to see you and I will call you back.

## 2021-04-27 NOTE — Telephone Encounter (Signed)
Called pt back and spoke to pt's husband. Told him for pt to follow up with Cardiology per [facility] Dr. Shelby Mattocks CMA. Told him looks like Cardiology tried to call you back and left message. Please call Cardiology. Mr. Vetere verbalized understanding.

## 2021-04-27 NOTE — Telephone Encounter (Signed)
Patient's husband states the patient has felt cold and had shivers in her arms this morning. He states the symptoms went away and she has not had any other symptoms. He states she does have afib. He states he gave her her medication, but would like to know whether they need to do anything.

## 2021-04-27 NOTE — Telephone Encounter (Signed)
Patient's husband returning call. 

## 2021-04-30 LAB — URINE CULTURE: Culture: >=100000 — AB

## 2021-04-30 NOTE — Progress Notes (Signed)
Cardiology Clinic Note   Patient Name: Paige Levy Date of Encounter: 05/03/2021  Primary Care Provider:  Orma Flaming, MD Primary Cardiologist:  Buford Dresser, MD  Patient Profile    Paige Levy. [place] 80 year old female presents the clinic for follow-up evaluation of her atrial fibrillation and essential hypertension.  Past Medical History    Past Medical History:  Diagnosis Date  . Arthritis   . Atrial fibrillation ([place])   . Hyperlipemia   . Hypothyroidism   . Meniere's disease   . Thyroid disease    Past Surgical History:  Procedure Laterality Date  . ABDOMINAL HYSTERECTOMY    . CARDIOVERSION N/A 03/21/2021   Procedure: CARDIOVERSION;  Surgeon: Sanda Klein, MD;  Location: [facility] ENDOSCOPY;  Service: Cardiovascular;  Laterality: N/A;  . ERCP Left 10/03/2014   Procedure: ENDOSCOPIC RETROGRADE CHOLANGIOPANCREATOGRAPHY (ERCP);  Surgeon: Missy Sabins, MD;  Location: [facility];  Service: Gastroenterology;  Laterality: Left;  . ERCP N/A 01/14/2015   Procedure: ENDOSCOPIC RETROGRADE CHOLANGIOPANCREATOGRAPHY (ERCP);  Surgeon: Jeryl Columbia, MD;  Location: [facility] ENDOSCOPY;  Service: Endoscopy;  Laterality: N/A;  need lithotripter/ordered/LH  . SPHINCTEROTOMY  09/2014  . SPYGLASS LITHOTRIPSY N/A 01/14/2015   Procedure: JJKKXFGH LITHOTRIPSY;  Surgeon: Jeryl Columbia, MD;  Location: [facility] ENDOSCOPY;  Service: Endoscopy;  Laterality: N/A;    Allergies  Allergies  Allergen Reactions  . Ursodiol Other (See Comments)    Severe Dizziness    History of Present Illness    [name] has a PMH of atrial fibrillation, essential hypertension, chronic rhinitis, hypothyroidism, Mnire's disease, OA, dyslipidemia, and hypercoagulability.  She was seen by Dr. Harrell Gave on 03/29/2021.  During that time she was feeling much better since she underwent DCCV.  She reported having had sensation of palpitations, more energy, less shortness of breath with exertion.  She was tolerating  her metoprolol well.  Her atrial fibrillation was reviewed.  Her heart rate and blood pressure monitoring were discussed.  She was also introduced to the [place] device.  She denied chest pain, shortness of breath, orthopnea, PND, lower extremity edema, and syncope or palpitations.  She followed up with the atrial fibrillation clinic she had an EKG 04/19/2021.  She was on Multaq 400 mg twice daily and has not noticed any atrial fibrillation.  However, she did note diarrhea in the morning.  She also noted mild headache.  It was felt that she had not been on the medication long enough for DC.  If symptoms persisted recommendation was for flecainide 50 mg twice daily.  Recommended LFTs if staying on Multaq.  She presents the clinic today for follow-up evaluation states she feels fairly well.  She has not noticed any episodes of irregular or accelerated heartbeat.  She reports that her stomach is doing much better and she reports compliance with her Multaq.  She does note some increased fatigue and wonders if it is related to the metoprolol.  We reviewed her medications and I will continue her metoprolol at the current dose at this time.  I have asked her to contact the office if she experiences increased fatigue.  Her blood pressures have been well controlled.  She denies bleeding issues.  I will order an LFT today, and have her follow-up with the A. fib clinic in 3 to 4 months.  Today she denies chest pain, shortness of breath, lower extremity edema, fatigue, palpitations, melena, hematuria, hemoptysis, diaphoresis, weakness, presyncope, syncope, orthopnea, and PND.   Home Medications    Prior to Admission  medications   Medication Sig Start Date End Date Taking? Authorizing Provider  apixaban (ELIQUIS) 5 MG TABS tablet Take 1 tablet (5 mg total) by mouth 2 (two) times daily. 02/27/21   Buford Dresser, MD  cephALEXin (KEFLEX) 500 MG capsule Take 1 capsule (500 mg total) by mouth 2 (two) times  daily for 5 days. 04/27/21 05/02/21  Molpus, Jenny Reichmann, MD  diltiazem (CARDIZEM) 30 MG tablet Take 1 tablet every 4 hours AS NEEDED for heart rate >100 as long as top BP >100. 04/12/21   Sherran Needs, NP  dronedarone (MULTAQ) 400 MG tablet Take 1 tablet (400 mg total) by mouth 2 (two) times daily with a meal. 04/12/21   Sherran Needs, NP  levothyroxine (SYNTHROID) 88 MCG tablet TAKE 1 TABLET (88 MCG TOTAL) BY MOUTH DAILY BEFORE BREAKFAST. 01/18/21   Orma Flaming, MD  metoprolol succinate (TOPROL-XL) 25 MG 24 hr tablet Take 1 tablet (25 mg total) by mouth daily. Take with or immediately following a meal. 04/12/21 07/11/21  Sherran Needs, NP  Multiple Vitamin (MULTIVITAMIN WITH MINERALS) TABS tablet Take 2 tablets by mouth daily. Gummies    [provider]  naphazoline-glycerin (CLEAR EYES) 0.012-0.2 % SOLN Place 1-2 drops into both eyes every 4 (four) hours as needed for irritation.    [provider]  Potassium Gluconate 550 MG TABS Take 550 mg by mouth daily.    [provider]  rosuvastatin (CRESTOR) 10 MG tablet Take 1 tablet (10 mg total) by mouth daily. 12/27/20   Orma Flaming, MD  triamterene-hydrochlorothiazide (MAXZIDE-25) 37.5-25 MG tablet Take 1 tablet by mouth daily. 04/10/21   Isla Pence, MD    Family History    Family History  Problem Relation Age of Onset  . Other Mother   . Heart attack Mother   . Other Father    She indicated that her mother is deceased. She indicated that her father is deceased.  Social History    Social History   Socioeconomic History  . Marital status: Married    Spouse name: Not on file  . Number of children: Not on file  . Years of education: Not on file  . Highest education level: Not on file  Occupational History  . Not on file  Tobacco Use  . Smoking status: Never Smoker  . Smokeless tobacco: Never Used  Substance and Sexual Activity  . Alcohol use: No  . Drug use: No  . Sexual activity: Never  Other  Topics Concern  . Not on file  Social History Narrative  . Not on file   Social Determinants of Health   Financial Resource Strain: Not on file  Food Insecurity: Not on file  Transportation Needs: Not on file  Physical Activity: Not on file  Stress: Not on file  Social Connections: Not on file  Intimate Partner Violence: Not on file     Review of Systems    General:  No chills, fever, night sweats or weight changes.  Cardiovascular:  No chest pain, dyspnea on exertion, edema, orthopnea, palpitations, paroxysmal nocturnal dyspnea. Dermatological: No rash, lesions/masses Respiratory: No cough, dyspnea Urologic: No hematuria, dysuria Abdominal:   No nausea, vomiting, diarrhea, bright red blood per rectum, melena, or hematemesis Neurologic:  No visual changes, wkns, changes in mental status. All other systems reviewed and are otherwise negative except as noted above.  Physical Exam    VS:  BP 124/60 (BP Location: Right Arm, Patient Position: Sitting, Cuff Size: Normal)   Pulse  67   Ht 5\' 5"  (1.651 m)   Wt 155 lb (70.3 kg)   BMI 25.79 kg/m  , BMI Body mass index is 25.79 kg/m. GEN: Well nourished, well developed, in no acute distress. HEENT: normal. Neck: Supple, no JVD, carotid bruits, or masses. Cardiac: RRR, no murmurs, rubs, or gallops. No clubbing, cyanosis, edema.  Radials/DP/PT 2+ and equal bilaterally.  Respiratory:  Respirations regular and unlabored, clear to auscultation bilaterally. GI: Soft, nontender, nondistended, BS + x 4. MS: no deformity or atrophy. Skin: warm and dry, no rash. Neuro:  Strength and sensation are intact. Psych: Normal affect.  Accessory Clinical Findings    Recent Labs: 02/08/2021: ALT 14 04/10/2021: TSH 2.138 04/27/2021: BUN 13; Creatinine, Ser 0.86; Hemoglobin 12.3; Magnesium 1.8; Platelets 156; Potassium 3.0; Sodium 136   Recent Lipid Panel    Component Value Date/Time   CHOL 151 02/08/2021 1039   TRIG 108.0 02/08/2021 1039   HDL  57.50 02/08/2021 1039   CHOLHDL 3 02/08/2021 1039   VLDL 21.6 02/08/2021 1039   LDLCALC 72 02/08/2021 1039   LDLCALC 195 (H) 08/03/2020 1029    ECG personally reviewed by me today-normal sinus rhythm 67 bpm no ST or T wave deviation  Echocardiogram 03/24/2021 IMPRESSIONS    1. Left ventricular ejection fraction, by estimation, is 55 to 60%. The  left ventricle has normal function. The left ventricle has no regional  wall motion abnormalities. Left ventricular diastolic parameters are  indeterminate.  2. Right ventricular systolic function is normal. The right ventricular  size is normal.  3. The mitral valve is normal in structure. Mild mitral valve  regurgitation. No evidence of mitral stenosis.  4. The aortic valve is tricuspid. Aortic valve regurgitation is not  visualized. Mild to moderate aortic valve sclerosis/calcification is  present, without any evidence of aortic stenosis.  5. The inferior vena cava is normal in size with greater than 50%  respiratory variability, suggesting right atrial pressure of 3 mmHg.  Assessment & Plan   1.  Paroxysmal atrial fibrillation-heart rate today normal sinus rhythm 67 bpm.  Denies recent episodes of slower heart rate or irregular heartbeats.  Tolerating Multaq well.  Denies recurrent issues of diarrhea.  CHA2DS2-VASc score 3 Continue Multaq, apixaban, metoprolol, diltiazem Heart healthy low-sodium diet Increase physical activity as tolerated Avoid triggers caffeine, chocolate, EtOH, dehydration etc. Order LFTs  Essential hypertension-BP today 124/60.  Well-controlled at home. Continue metoprolol, diltiazem, triamterene, hydrochlorothiazide Heart healthy low-sodium diet-salty 6 given Increase physical activity as tolerated  Hyperlipidemia-02/08/2021: Cholesterol 151; HDL 57.50; LDL Cholesterol 72; Triglycerides 108.0; VLDL 21.6 Continue rosuvastatin Heart healthy low-sodium high-fiber diet Increase physical activity as  tolerated  Disposition: Follow-up with atrial fibrillation clinic 3-4 months  Jaquann Guarisco M. Masaki Rothbauer NP-C    05/03/2021, 11:04 AM [place] [address] Office [phone] Fax [phone]  Notice: This dictation was prepared with Dragon dictation along with smaller phrase technology. Any transcriptional errors that result from this process are unintentional and may not be corrected upon review.  I spent 14 minutes examining this patient, reviewing medications, and using patient centered shared decision making involving her cardiac care.  Prior to her visit I spent greater than 20 minutes reviewing her past medical history,  medications, and prior cardiac tests.

## 2021-05-01 ENCOUNTER — Telehealth: Payer: Self-pay | Admitting: Emergency Medicine

## 2021-05-01 NOTE — Telephone Encounter (Signed)
Post ED Visit - Positive Culture Follow-up  Culture report reviewed by antimicrobial stewardship pharmacist: [facility] Team []  Elenor Quinones, Pharm.D. []  Heide Guile, Pharm.D., BCPS AQ-ID []  Parks Neptune, Pharm.D., BCPS []  Alycia Rossetti, Pharm.D., BCPS []  [place], Pharm.D., BCPS, AAHIVP []  Legrand Como, Pharm.D., BCPS, AAHIVP []  Salome Arnt, PharmD, BCPS []  Johnnette Gourd, PharmD, BCPS []  Hughes Better, PharmD, BCPS []  Leeroy Cha, PharmD []  Laqueta Linden, PharmD, BCPS []  Albertina Parr, PharmD Cristela Felt PharmD  [facility] Team []  Leodis Sias, PharmD []  Lindell Spar, PharmD []  Royetta Asal, PharmD []  Graylin Shiver, Rph []  Rema Fendt) Glennon Mac, PharmD []  Arlyn Dunning, PharmD []  Netta Cedars, PharmD []  Dia Sitter, PharmD []  Leone Haven, PharmD []  Gretta Arab, PharmD []  Theodis Shove, PharmD []  Peggyann Juba, PharmD []  Reuel Boom, PharmD   Positive urine culture Treated with cephalexin, organism sensitive to the same and no further patient follow-up is required at this time.  Hazle Nordmann 05/01/2021, 3:36 PM

## 2021-05-03 ENCOUNTER — Ambulatory Visit: Payer: Medicare Other | Admitting: General Practice

## 2021-05-03 ENCOUNTER — Other Ambulatory Visit: Payer: Self-pay

## 2021-05-03 ENCOUNTER — Encounter: Payer: Self-pay | Admitting: General Practice

## 2021-05-03 VITALS — BP 124/60 | HR 67 | Ht 65.0 in | Wt 155.0 lb

## 2021-05-03 DIAGNOSIS — I10 Essential (primary) hypertension: Secondary | ICD-10-CM

## 2021-05-03 DIAGNOSIS — Z79899 Other long term (current) drug therapy: Secondary | ICD-10-CM | POA: Diagnosis not present

## 2021-05-03 DIAGNOSIS — E7800 Pure hypercholesterolemia, unspecified: Secondary | ICD-10-CM

## 2021-05-03 DIAGNOSIS — I480 Paroxysmal atrial fibrillation: Secondary | ICD-10-CM

## 2021-05-03 LAB — HEPATIC FUNCTION PANEL
ALT: 28 IU/L (ref 0–32)
AST: 24 IU/L (ref 0–40)
Albumin: 4.4 g/dL (ref 3.6–4.6)
Alkaline Phosphatase: 91 IU/L (ref 44–121)
Bilirubin Total: 0.9 mg/dL (ref 0.0–1.2)
Bilirubin, Direct: 0.24 mg/dL (ref 0.00–0.40)
Total Protein: 7.4 g/dL (ref 6.0–8.5)

## 2021-05-03 NOTE — Patient Instructions (Signed)
Medication Instructions:  The current medical regimen is effective;  continue present plan and medications as directed. Please refer to the Current Medication list given to you today.  *If you need a refill on your cardiac medications before your next appointment, please call your pharmacy*  Lab Work:   Testing/Procedures:  LFT TODAY   DONE  Special Instructions PLEASE READ AND FOLLOW SALTY 6-ATTACHED-1,800mg  daily  PLEASE MAINTAIN PHYSICAL ACTIVITY AS TOLERATED  Follow-Up: Your next appointment:  3-4 month(s) In Person with You will follow up in the [facility] located at [facility]. Your provider will be:  Roderic Palau, NP   At [facility], you and your health needs are our priority.  As part of our continuing mission to provide you with exceptional heart care, we have created designated Provider Care Teams.  These Care Teams include your primary Cardiologist (physician) and Advanced Practice Providers (APPs -  Physician Assistants and Nurse Practitioners) who all work together to provide you with the care you need, when you need it.            6 SALTY THINGS TO AVOID     1,800MG  DAILY

## 2021-05-05 ENCOUNTER — Telehealth: Payer: Self-pay

## 2021-05-05 NOTE — Telephone Encounter (Signed)
.   LAST APPOINTMENT DATE: 04/27/2021   NEXT APPOINTMENT DATE:@Visit  date not found  MEDICATIONtriamterene-hydrochlorothiazide (MAXZIDE-25) 37.5-25 MG tablet   PHARMACY:[id number] - [place],  - [address]

## 2021-05-08 ENCOUNTER — Other Ambulatory Visit: Payer: Self-pay | Admitting: Family Medicine

## 2021-05-08 MED ORDER — TRIAMTERENE-HCTZ 37.5-25 MG PO TABS: 1.0000 | ORAL_TABLET | Freq: Every day | ORAL | 1 refills | Status: DC

## 2021-06-15 ENCOUNTER — Other Ambulatory Visit: Payer: Self-pay | Admitting: Cardiology

## 2021-06-19 ENCOUNTER — Telehealth: Payer: Self-pay | Admitting: Cardiology

## 2021-06-19 NOTE — Telephone Encounter (Signed)
Returned call to husband (ok per DPR)-wanted to clarify prescriptions.   She has been taking multaq 400 BID and Toprol XL BID.   Recent OV med list states Toprol 25 daily but husband reports she has been taking this BID (last rx 6/23 was refilled BID).    He request appt for follow up in September with Dr. Harrell Gave at [facility].   Appt scheduled.

## 2021-06-19 NOTE — Telephone Encounter (Signed)
Patient's husband has a question about a medication that Dr. Harrell Gave prescribed.

## 2021-07-09 ENCOUNTER — Other Ambulatory Visit (HOSPITAL_COMMUNITY): Payer: Self-pay | Admitting: Nurse Practitioner

## 2021-08-03 ENCOUNTER — Other Ambulatory Visit: Payer: Self-pay | Admitting: Family Medicine

## 2021-08-03 NOTE — Telephone Encounter (Signed)
Pt is requesting refill on Levothyroxine 88 mcg. Pt is scheduled to see you in Oct for Transfer of care.

## 2021-08-03 NOTE — Telephone Encounter (Signed)
Levothyroxine refilled

## 2021-08-30 ENCOUNTER — Other Ambulatory Visit: Payer: Self-pay

## 2021-08-30 ENCOUNTER — Encounter (HOSPITAL_BASED_OUTPATIENT_CLINIC_OR_DEPARTMENT_OTHER): Payer: Self-pay | Admitting: Cardiology

## 2021-08-30 ENCOUNTER — Other Ambulatory Visit: Payer: Self-pay | Admitting: Cardiology

## 2021-08-30 ENCOUNTER — Ambulatory Visit (HOSPITAL_BASED_OUTPATIENT_CLINIC_OR_DEPARTMENT_OTHER): Payer: Medicare Other | Admitting: Cardiology

## 2021-08-30 VITALS — BP 141/63 | HR 65 | Ht 65.0 in | Wt 154.0 lb

## 2021-08-30 DIAGNOSIS — E876 Hypokalemia: Secondary | ICD-10-CM | POA: Diagnosis not present

## 2021-08-30 DIAGNOSIS — I10 Essential (primary) hypertension: Secondary | ICD-10-CM | POA: Diagnosis not present

## 2021-08-30 DIAGNOSIS — I480 Paroxysmal atrial fibrillation: Secondary | ICD-10-CM

## 2021-08-30 DIAGNOSIS — I4891 Unspecified atrial fibrillation: Secondary | ICD-10-CM

## 2021-08-30 DIAGNOSIS — Z7901 Long term (current) use of anticoagulants: Secondary | ICD-10-CM | POA: Diagnosis not present

## 2021-08-30 DIAGNOSIS — E7800 Pure hypercholesterolemia, unspecified: Secondary | ICD-10-CM | POA: Diagnosis not present

## 2021-08-30 DIAGNOSIS — D6869 Other thrombophilia: Secondary | ICD-10-CM | POA: Diagnosis not present

## 2021-08-30 DIAGNOSIS — Z79899 Other long term (current) drug therapy: Secondary | ICD-10-CM

## 2021-08-30 NOTE — Patient Instructions (Signed)
Medication Instructions:  Your Physician recommend you continue on your current medication as directed.    *If you need a refill on your cardiac medications before your next appointment, please call your pharmacy*   Lab Work: Your physician recommends lab work today (BMP).  If you have labs (blood work) drawn today and your tests are completely normal, you will receive your results only by: [place] (if you have MyChart) OR A paper copy in the mail If you have any lab test that is abnormal or we need to change your treatment, we will call you to review the results.   Testing/Procedures: None ordered today   Follow-Up: At [facility], you and your health needs are our priority.  As part of our continuing mission to provide you with exceptional heart care, we have created designated Provider Care Teams.  These Care Teams include your primary Cardiologist (physician) and Advanced Practice Providers (APPs -  Physician Assistants and Nurse Practitioners) who all work together to provide you with the care you need, when you need it.  We recommend signing up for the patient portal called "MyChart".  Sign up information is provided on this After Visit Summary.  MyChart is used to connect with patients for Virtual Visits (Telemedicine).  Patients are able to view lab/test results, encounter notes, upcoming appointments, etc.  Non-urgent messages can be sent to your provider as well.   To learn more about what you can do with MyChart, go to [email or web address].    Your next appointment:   6 month(s)  The format for your next appointment:   In Person  Provider:   Buford Dresser, MD

## 2021-08-30 NOTE — Progress Notes (Signed)
Cardiology Office Note:    Date:  08/30/2021   ID:  Paige Levy, DOB July 31, 1941, MRN [id number]  PCP:  Merryl Hacker, No  Cardiologist:  Buford Dresser, MD  Referring MD: Orma Flaming, MD   CC: follow up  History of Present Illness:    Paige Levy is a 80 y.o. female with a hx of hypothyroidism, hyperlipidemia, hypertension who is seen for follow up today. I initially met her 02/27/21 as a new consult at the request of Orma Flaming, MD for the evaluation and management of atrial fibrillation.  Today: Overall doing well, but has several concerns. Noted that she had sudden pop up of varicose veins, worse when she is walking a lot. Comes and goes. Notes the pain is more in her knee. She has had the varicose veins since her late 18s and they have never bothered her before. Also notes some mild upset in her stomach with some of her pills. Also worried about the medications affecting her short term memory, which is new.   Checks blood pressure at home, usually well controlled. Does have a history of low potassium, last 3.0 in May .  Denies chest pain, shortness of breath at rest or with normal exertion. No PND, orthopnea, LE edema or unexpected weight gain. No syncope or palpitations. No melena or hematochezia. Does feel fatigued more than she used to.   Has a KardiaMobile, checks daily, rate and rhythm have been normal. Has never taken diltiazem, does not think she has at home. Removed from med list today per preference. Heart rates usually 60 to occasionally 70, no low or high numbers.  Past Medical History:  Diagnosis Date   Arthritis    Atrial fibrillation ([place])    Hyperlipemia    Hypothyroidism    Meniere's disease    Thyroid disease     Past Surgical History:  Procedure Laterality Date   ABDOMINAL HYSTERECTOMY     CARDIOVERSION N/A 03/21/2021   Procedure: CARDIOVERSION;  Surgeon: Sanda Klein, MD;  Location: [facility] ENDOSCOPY;  Service: Cardiovascular;  Laterality: N/A;    ERCP Left 10/03/2014   Procedure: ENDOSCOPIC RETROGRADE CHOLANGIOPANCREATOGRAPHY (ERCP);  Surgeon: Missy Sabins, MD;  Location: [facility];  Service: Gastroenterology;  Laterality: Left;   ERCP N/A 01/14/2015   Procedure: ENDOSCOPIC RETROGRADE CHOLANGIOPANCREATOGRAPHY (ERCP);  Surgeon: Jeryl Columbia, MD;  Location: [facility] ENDOSCOPY;  Service: Endoscopy;  Laterality: N/A;  need lithotripter/ordered/LH   SPHINCTEROTOMY  09/2014   SPYGLASS LITHOTRIPSY N/A 01/14/2015   Procedure: RCBULAGT LITHOTRIPSY;  Surgeon: Jeryl Columbia, MD;  Location: [facility] ENDOSCOPY;  Service: Endoscopy;  Laterality: N/A;    Current Medications: Current Outpatient Medications on File Prior to Visit  Medication Sig   levothyroxine (SYNTHROID) 88 MCG tablet TAKE 1 TABLET BY MOUTH DAILY BEFORE BREAKFAST.   metoprolol succinate (TOPROL-XL) 25 MG 24 hr tablet TAKE 1 TABLET BY MOUTH TWICE A DAY   MULTAQ 400 MG tablet TAKE 1 TABLET (400 MG TOTAL) BY MOUTH 2 (TWO) TIMES DAILY WITH A MEAL.   Multiple Vitamin (MULTIVITAMIN WITH MINERALS) TABS tablet Take 2 tablets by mouth daily. Gummies   naphazoline-glycerin (CLEAR EYES) 0.012-0.2 % SOLN Place 1-2 drops into both eyes every 4 (four) hours as needed for irritation.   Potassium Gluconate 550 MG TABS Take 550 mg by mouth daily.   rosuvastatin (CRESTOR) 10 MG tablet Take 1 tablet (10 mg total) by mouth daily.   triamterene-hydrochlorothiazide (MAXZIDE-25) 37.5-25 MG tablet Take 1 tablet by mouth daily.   No current  facility-administered medications on file prior to visit.     Allergies:   Ursodiol   Social History   Tobacco Use   Smoking status: Never   Smokeless tobacco: Never  Substance Use Topics   Alcohol use: No   Drug use: No    Family History: family history includes Heart attack in her mother; Other in her father and mother.  ROS:   Please see the history of present illness.  Additional pertinent ROS otherwise unremarkable.  EKGs/Labs/Other Studies Reviewed:     The following studies were reviewed today: Echo 03/24/21  1. Left ventricular ejection fraction, by estimation, is 55 to 60%. The  left ventricle has normal function. The left ventricle has no regional  wall motion abnormalities. Left ventricular diastolic parameters are  indeterminate.   2. Right ventricular systolic function is normal. The right ventricular  size is normal.   3. The mitral valve is normal in structure. Mild mitral valve  regurgitation. No evidence of mitral stenosis.   4. The aortic valve is tricuspid. Aortic valve regurgitation is not  visualized. Mild to moderate aortic valve sclerosis/calcification is  present, without any evidence of aortic stenosis.   5. The inferior vena cava is normal in size with greater than 50%  respiratory variability, suggesting right atrial pressure of 3 mmHg.  EKG:  EKG is personally reviewed. 08/30/21 SR with PACs, 65 bpm 03/29/21 sinus rhythm with PACs at 63 bpm. 02/27/21: atrial fibrillation with RVR at 106 bpm  Recent Labs: 04/10/2021: TSH 2.138 04/27/2021: BUN 13; Creatinine, Ser 0.86; Hemoglobin 12.3; Magnesium 1.8; Platelets 156; Potassium 3.0; Sodium 136 05/03/2021: ALT 28  Recent Lipid Panel    Component Value Date/Time   CHOL 151 02/08/2021 1039   TRIG 108.0 02/08/2021 1039   HDL 57.50 02/08/2021 1039   CHOLHDL 3 02/08/2021 1039   VLDL 21.6 02/08/2021 1039   LDLCALC 72 02/08/2021 1039   LDLCALC 195 (H) 08/03/2020 1029    Physical Exam:    VS:  BP (!) 141/63   Pulse 65   Ht _0  (1.651 m)   Wt 154 lb (69.9 kg)   BMI 25.63 kg/m     Wt Readings from Last 3 Encounters:  08/30/21 154 lb (69.9 kg)  05/03/21 155 lb (70.3 kg)  04/27/21 157 lb 6.5 oz (71.4 kg)    GEN: Well nourished, well developed in no acute distress HEENT: Normal, moist mucous membranes NECK: No JVD CARDIAC: regular rhythm, normal S1 and S2, no rubs or gallops. No murmur. VASCULAR: Radial and DP pulses 2+ bilaterally. No carotid bruits RESPIRATORY:   Clear to auscultation without rales, wheezing or rhonchi  ABDOMEN: Soft, non-tender, non-distended MUSCULOSKELETAL:  Ambulates independently SKIN: Warm and dry, no edema NEUROLOGIC:  Alert and oriented x 3. No focal neuro deficits noted. PSYCHIATRIC:  Normal affect    ASSESSMENT:    1. Hypokalemia   2. Primary hypertension   3. Paroxysmal atrial fibrillation (HCC)   4. Medication management   5. Secondary hypercoagulable state ([place])   6. Current use of long term anticoagulation   7. Pure hypercholesterolemia     PLAN:    atrial fibrillation, paroxysmal: -CHA2DS2/VAS Stroke Risk Points=3 -continue apixaban 5 mg BID (only meets age criteria for reduction, not weight/Cr) -echo unremarkable -thyroid levels WNL -used KardiaMobile at home, has not noted elevated/irregular heart rates/rhythm -was not taking PRN dilt ever, removed from med list -on dronedarone, metoprolol -consider dropping back on metoprolol if symptoms persist  Hypertension: elevated in office  but reports well controlled typically -has history of hypokalemia. With short term memory issues, check sodium as well. May need to change from triamterene-HCTZ if electrolytes are abnormal  Hypercholesterolemia: -continue rosuvastatin 10 mg daily  Cardiac risk counseling and prevention recommendations: -recommend heart healthy/Mediterranean diet, with whole grains, fruits, vegetable, fish, lean meats, nuts, and olive oil. Limit salt. -recommend moderate walking, 3-5 times/week for 30-50 minutes each session. Aim for at least 150 minutes.week. Goal should be pace of 3 miles/hours, or walking 1.5 miles in 30 minutes -recommend avoidance of tobacco products. Avoid excess alcohol.  Plan for follow up: 6 mos or sooner as needed  Buford Dresser, MD, PhD, [facility]    Medication Adjustments/Labs and Tests Ordered: Current medicines are reviewed at length with the patient today.  Concerns  regarding medicines are outlined above.  Orders Placed This Encounter  Procedures   Basic metabolic panel   EKG 27-XAJO    No orders of the defined types were placed in this encounter.   Patient Instructions  Medication Instructions:  Your Physician recommend you continue on your current medication as directed.    *If you need a refill on your cardiac medications before your next appointment, please call your pharmacy*   Lab Work: Your physician recommends lab work today (BMP).  If you have labs (blood work) drawn today and your tests are completely normal, you will receive your results only by: [place] (if you have MyChart) OR A paper copy in the mail If you have any lab test that is abnormal or we need to change your treatment, we will call you to review the results.   Testing/Procedures: None ordered today   Follow-Up: At [facility], you and your health needs are our priority.  As part of our continuing mission to provide you with exceptional heart care, we have created designated Provider Care Teams.  These Care Teams include your primary Cardiologist (physician) and Advanced Practice Providers (APPs -  Physician Assistants and Nurse Practitioners) who all work together to provide you with the care you need, when you need it.  We recommend signing up for the patient portal called "MyChart".  Sign up information is provided on this After Visit Summary.  MyChart is used to connect with patients for Virtual Visits (Telemedicine).  Patients are able to view lab/test results, encounter notes, upcoming appointments, etc.  Non-urgent messages can be sent to your provider as well.   To learn more about what you can do with MyChart, go to [email or web address].    Your next appointment:   6 month(s)  The format for your next appointment:   In Person  Provider:   Buford Dresser, MD     Signed, Buford Dresser, MD PhD 08/30/2021    [place]

## 2021-08-30 NOTE — Telephone Encounter (Signed)
Prescription refill request for Eliquis received. Indication:afib Last office visit:[place] 05/03/21 Scr:0.86 04/27/21 Age: 68fWeight:70.3kg

## 2021-08-31 LAB — BASIC METABOLIC PANEL
BUN/Creatinine Ratio: 18 (ref 12–28)
BUN: 18 mg/dL (ref 8–27)
CO2: 23 mmol/L (ref 20–29)
Calcium: 9.5 mg/dL (ref 8.7–10.3)
Chloride: 96 mmol/L (ref 96–106)
Creatinine, Ser: 0.99 mg/dL (ref 0.57–1.00)
Glucose: 42 mg/dL — ABNORMAL LOW (ref 65–99)
Potassium: 3.7 mmol/L (ref 3.5–5.2)
Sodium: 141 mmol/L (ref 134–144)
eGFR: 57 mL/min/{1.73_m2} — ABNORMAL LOW (ref >59)

## 2021-09-05 ENCOUNTER — Ambulatory Visit (HOSPITAL_COMMUNITY): Payer: Medicare Other | Admitting: Nurse Practitioner

## 2021-09-06 ENCOUNTER — Telehealth: Payer: Self-pay

## 2021-09-06 ENCOUNTER — Other Ambulatory Visit: Payer: Self-pay | Admitting: Family Medicine

## 2021-09-06 ENCOUNTER — Other Ambulatory Visit: Payer: Self-pay

## 2021-09-06 MED ORDER — TRIAMTERENE-HCTZ 37.5-25 MG PO TABS: 1.0000 | ORAL_TABLET | Freq: Every day | ORAL | 1 refills | Status: DC

## 2021-09-06 NOTE — Telephone Encounter (Signed)
Rx sent in

## 2021-09-06 NOTE — Telephone Encounter (Signed)
.   Encourage patient to contact the pharmacy for refills or they can request refills through [place]:  Please schedule appointment if longer than 1 year  NEXT APPOINTMENT DATE:  MEDICATION:levothyroxine (SYNTHROID) 88 MCG tablet  triamterene-hydrochlorothiazide (MAXZIDE-25) 37.5-25 MG tablet  Is the patient out of medication?   PHARMACY:[id number] [place], [place] - [address]  Let patient know to contact pharmacy at the end of the day to make sure medication is ready.  Please notify patient to allow 48-72 hours to process  CLINICAL FILLS OUT ALL BELOW:   LAST REFILL:  QTY:  REFILL DATE:    OTHER COMMENTS:    Okay for refill?  Please advise

## 2021-09-19 ENCOUNTER — Ambulatory Visit (HOSPITAL_BASED_OUTPATIENT_CLINIC_OR_DEPARTMENT_OTHER): Payer: Medicare Other | Admitting: Nurse Practitioner

## 2021-10-04 ENCOUNTER — Encounter: Payer: Medicare Other | Admitting: Family Medicine

## 2021-11-07 DIAGNOSIS — H2513 Age-related nuclear cataract, bilateral: Secondary | ICD-10-CM | POA: Diagnosis not present

## 2021-11-23 ENCOUNTER — Other Ambulatory Visit: Payer: Self-pay

## 2021-11-23 ENCOUNTER — Ambulatory Visit (INDEPENDENT_AMBULATORY_CARE_PROVIDER_SITE_OTHER): Payer: Medicare Other | Admitting: Physician Assistant

## 2021-11-23 VITALS — BP 127/78 | HR 56 | Temp 98.2°F | Ht 65.0 in | Wt 151.2 lb

## 2021-11-23 DIAGNOSIS — I4891 Unspecified atrial fibrillation: Secondary | ICD-10-CM | POA: Diagnosis not present

## 2021-11-23 DIAGNOSIS — S80811A Abrasion, right lower leg, initial encounter: Secondary | ICD-10-CM | POA: Diagnosis not present

## 2021-11-23 NOTE — Progress Notes (Signed)
Subjective:    Patient ID: Paige Levy, female    DOB: 06-28-1941, 80 y.o.   MRN: [id number]  Chief Complaint  Patient presents with   Transitions Of Care   Leg Swelling    Leg bleeding    HPI 80 y.o. patient presents today for new patient establishment with me.  Patient was previously established with Dr. Rogers Blocker.  Current Care Team: Dr. Harrell Gave - Cardiology   Acute Concerns: Small area on right lower leg has been bleeding the last week after bumping it. Seems to be doing better today but just wanted this looked at.   Chronic Concerns: See PMH listed below, as well as A/P for details on issues we specifically discussed during today's visit.      Past Medical History:  Diagnosis Date   Arthritis    Atrial fibrillation ([place])    Hyperlipemia    Hypothyroidism    Meniere's disease    Thyroid disease     Past Surgical History:  Procedure Laterality Date   ABDOMINAL HYSTERECTOMY     CARDIOVERSION N/A 03/21/2021   Procedure: CARDIOVERSION;  Surgeon: Sanda Klein, MD;  Location: [facility] ENDOSCOPY;  Service: Cardiovascular;  Laterality: N/A;   ERCP Left 10/03/2014   Procedure: ENDOSCOPIC RETROGRADE CHOLANGIOPANCREATOGRAPHY (ERCP);  Surgeon: Missy Sabins, MD;  Location: [facility];  Service: Gastroenterology;  Laterality: Left;   ERCP N/A 01/14/2015   Procedure: ENDOSCOPIC RETROGRADE CHOLANGIOPANCREATOGRAPHY (ERCP);  Surgeon: Jeryl Columbia, MD;  Location: [facility] ENDOSCOPY;  Service: Endoscopy;  Laterality: N/A;  need lithotripter/ordered/LH   SPHINCTEROTOMY  09/2014   SPYGLASS LITHOTRIPSY N/A 01/14/2015   Procedure: GEXBMWUX LITHOTRIPSY;  Surgeon: Jeryl Columbia, MD;  Location: [facility] ENDOSCOPY;  Service: Endoscopy;  Laterality: N/A;    Family History  Problem Relation Age of Onset   Other Mother    Heart attack Mother    Other Father     Social History   Tobacco Use   Smoking status: Never   Smokeless tobacco: Never  Substance Use Topics   Alcohol use: No   Drug  use: No     Allergies  Allergen Reactions   Ursodiol Other (See Comments)    Severe Dizziness    Review of Systems NEGATIVE UNLESS OTHERWISE INDICATED IN HPI      Objective:     BP 127/78   Pulse (!) 56   Temp 98.2 F (36.8 C)   Ht 5\' 5"  (1.651 m)   Wt 151 lb 3.2 oz (68.6 kg)   SpO2 98%   BMI 25.16 kg/m   Wt Readings from Last 3 Encounters:  11/23/21 151 lb 3.2 oz (68.6 kg)  08/30/21 154 lb (69.9 kg)  05/03/21 155 lb (70.3 kg)    BP Readings from Last 3 Encounters:  11/23/21 127/78  08/30/21 (!) 141/63  05/03/21 124/60     Physical Exam Vitals reviewed.  Constitutional:      General: She is not in acute distress.    Appearance: Normal appearance. She is normal weight.  HENT:     Head: Normocephalic and atraumatic.  Cardiovascular:     Rate and Rhythm: Regular rhythm. Bradycardia present.     Heart sounds: No murmur heard. Pulmonary:     Effort: Pulmonary effort is normal.     Breath sounds: Normal breath sounds. No rhonchi or rales.  Abdominal:     General: Abdomen is flat. Bowel sounds are normal.     Palpations: Abdomen is soft.  Musculoskeletal:  Cervical back: Normal range of motion and neck supple.  Skin:    General: Skin is warm.     Capillary Refill: Capillary refill takes less than 2 seconds.       Neurological:     General: No focal deficit present.     Mental Status: She is alert and oriented to person, place, and time.  Psychiatric:        Mood and Affect: Mood normal.        Behavior: Behavior normal.       Assessment & Plan:   Problem List Items Addressed This Visit       Cardiovascular and Mediastinum   Atrial fibrillation ([place])   Other Visit Diagnoses     Abrasion of right lower extremity, initial encounter    -  Primary      1. Abrasion of right lower extremity, initial encounter -Well appearing, not actively bleeding -Advised Vaseline over area and keeping covered as needed -Recheck if worse or no imp as  needed  2. Atrial fibrillation, unspecified type ([place]) -She is following with the A-fib clinic  -Stable on Eliquis 5 mg BID, Toprol XL 25 mg BID -No concerns from pt today    Trayvond Viets M Raphaela Cannaday, PA-C

## 2021-12-22 ENCOUNTER — Other Ambulatory Visit: Payer: Self-pay | Admitting: Family Medicine

## 2021-12-22 ENCOUNTER — Other Ambulatory Visit (HOSPITAL_COMMUNITY): Payer: Self-pay | Admitting: Cardiology

## 2022-02-12 ENCOUNTER — Other Ambulatory Visit: Payer: Self-pay | Admitting: Family Medicine

## 2022-02-12 ENCOUNTER — Other Ambulatory Visit: Payer: Self-pay | Admitting: General Practice

## 2022-02-12 DIAGNOSIS — I4891 Unspecified atrial fibrillation: Secondary | ICD-10-CM

## 2022-02-12 NOTE — Telephone Encounter (Signed)
Prescription refill request for Eliquis received. Indication:Afib Last office visit:9/22 Scr:0.9 Age: 81 Weight:68.6 kg  Prescription refilled

## 2022-02-15 ENCOUNTER — Emergency Department (HOSPITAL_BASED_OUTPATIENT_CLINIC_OR_DEPARTMENT_OTHER): Payer: Medicare Other

## 2022-02-15 ENCOUNTER — Emergency Department (HOSPITAL_BASED_OUTPATIENT_CLINIC_OR_DEPARTMENT_OTHER)
Admission: EM | Admit: 2022-02-15 | Discharge: 2022-02-15 | Disposition: A | Discharge status: Home / Self Care | Payer: Medicare Other | Attending: Emergency Medicine | Admitting: Emergency Medicine

## 2022-02-15 ENCOUNTER — Other Ambulatory Visit: Payer: Self-pay

## 2022-02-15 ENCOUNTER — Encounter (HOSPITAL_BASED_OUTPATIENT_CLINIC_OR_DEPARTMENT_OTHER): Payer: Self-pay | Admitting: Emergency Medicine

## 2022-02-15 DIAGNOSIS — I4891 Unspecified atrial fibrillation: Secondary | ICD-10-CM | POA: Insufficient documentation

## 2022-02-15 DIAGNOSIS — Z79899 Other long term (current) drug therapy: Secondary | ICD-10-CM | POA: Diagnosis not present

## 2022-02-15 DIAGNOSIS — Z20822 Contact with and (suspected) exposure to covid-19: Secondary | ICD-10-CM | POA: Diagnosis not present

## 2022-02-15 DIAGNOSIS — B974 Respiratory syncytial virus as the cause of diseases classified elsewhere: Secondary | ICD-10-CM | POA: Diagnosis not present

## 2022-02-15 DIAGNOSIS — Z7901 Long term (current) use of anticoagulants: Secondary | ICD-10-CM | POA: Insufficient documentation

## 2022-02-15 DIAGNOSIS — R0602 Shortness of breath: Secondary | ICD-10-CM | POA: Insufficient documentation

## 2022-02-15 LAB — COMPREHENSIVE METABOLIC PANEL
ALT: 11 U/L (ref 0–44)
AST: 18 U/L (ref 15–41)
Albumin: 4.1 g/dL (ref 3.5–5.0)
Alkaline Phosphatase: 52 U/L (ref 38–126)
Anion gap: 7 (ref 5–15)
BUN: 18 mg/dL (ref 8–23)
CO2: 30 mmol/L (ref 22–32)
Calcium: 9.2 mg/dL (ref 8.9–10.3)
Chloride: 102 mmol/L (ref 98–111)
Creatinine, Ser: 0.95 mg/dL (ref 0.44–1.00)
GFR, Estimated: 60 mL/min — ABNORMAL LOW (ref >60)
Glucose, Bld: 111 mg/dL — ABNORMAL HIGH (ref 70–99)
Potassium: 3.9 mmol/L (ref 3.5–5.1)
Sodium: 139 mmol/L (ref 135–145)
Total Bilirubin: 1.3 mg/dL — ABNORMAL HIGH (ref 0.3–1.2)
Total Protein: 6.5 g/dL (ref 6.5–8.1)

## 2022-02-15 LAB — CBC WITH DIFFERENTIAL/PLATELET
Abs Immature Granulocytes: 0.05 10*3/uL (ref 0.00–0.07)
Basophils Absolute: 0 10*3/uL (ref 0.0–0.1)
Basophils Relative: 1 %
Eosinophils Absolute: 0.1 10*3/uL (ref 0.0–0.5)
Eosinophils Relative: 1 %
HCT: 44.1 % (ref 36.0–46.0)
Hemoglobin: 14.6 g/dL (ref 12.0–15.0)
Immature Granulocytes: 1 %
Lymphocytes Relative: 21 %
Lymphs Abs: 1.8 10*3/uL (ref 0.7–4.0)
MCH: 30.7 pg (ref 26.0–34.0)
MCHC: 33.1 g/dL (ref 30.0–36.0)
MCV: 92.6 fL (ref 80.0–100.0)
Monocytes Absolute: 0.6 10*3/uL (ref 0.1–1.0)
Monocytes Relative: 7 %
Neutro Abs: 5.9 10*3/uL (ref 1.7–7.7)
Neutrophils Relative %: 69 %
Platelets: 222 10*3/uL (ref 150–400)
RBC: 4.76 MIL/uL (ref 3.87–5.11)
RDW: 12.7 % (ref 11.5–15.5)
WBC: 8.4 10*3/uL (ref 4.0–10.5)
nRBC: 0 % (ref 0.0–0.2)

## 2022-02-15 LAB — RESP PANEL BY RT-PCR (FLU A&B, COVID) ARPGX2
Influenza A by PCR: NEGATIVE
Influenza B by PCR: NEGATIVE
SARS Coronavirus 2 by RT PCR: NEGATIVE

## 2022-02-15 LAB — BRAIN NATRIURETIC PEPTIDE: B Natriuretic Peptide: 249.8 pg/mL — ABNORMAL HIGH (ref 0.0–100.0)

## 2022-02-15 LAB — D-DIMER, QUANTITATIVE: D-Dimer, Quant: 0.45 ug/mL-FEU (ref 0.00–0.50)

## 2022-02-15 MED ORDER — DILTIAZEM HCL 30 MG PO TABS
30.0000 mg | ORAL_TABLET | Freq: Once | ORAL | Status: AC
  Administered 2022-02-15: 30 mg via ORAL
  Filled 2022-02-15: qty 1

## 2022-02-15 MED ORDER — METOPROLOL TARTRATE 5 MG/5ML IV SOLN
5.0000 mg | Freq: Once | INTRAVENOUS | Status: AC
  Administered 2022-02-15: 5 mg via INTRAVENOUS
  Filled 2022-02-15: qty 5

## 2022-02-15 MED ORDER — DILTIAZEM HCL 30 MG PO TABS: 30.0000 mg | ORAL_TABLET | Freq: Every day | ORAL | 0 refills | Status: DC | PRN

## 2022-02-15 MED ORDER — IOHEXOL 350 MG/ML SOLN
100.0000 mL | Freq: Once | INTRAVENOUS | Status: AC | PRN
  Administered 2022-02-15: 75 mL via INTRAVENOUS

## 2022-02-15 MED ORDER — LACTATED RINGERS IV SOLN: INTRAVENOUS | Status: DC

## 2022-02-15 NOTE — ED Provider Notes (Signed)
[facility] EMERGENCY DEPT Provider Note   CSN: [id number] Arrival date & time: 02/15/22  1339     History  No chief complaint on file.   Paige Levy is a 81 y.o. female.  81 year old female presents with shortness of breath this morning.  Has had a mild nonproductive cough.  Patient had a cardiac mobile device placed and was found to have a heart rate of 125.  Patient states that she does have history of paroxysmal A-fib and takes Eliquis for this.  Last time she checked her heart rate was 4 days ago.  Denies any fever or chills.  No emesis noted.  No chest pain or chest pressure.  Was told by her doctor to come here.  Her cardiologist is Dr. Harrell Gave      Home Medications Prior to Admission medications   Medication Sig Start Date End Date Taking? Authorizing Provider  dronedarone (MULTAQ) 400 MG tablet Take 1 tablet (400 mg total) by mouth 2 (two) times daily with a meal. 12/22/21   Buford Dresser, MD  ELIQUIS 5 MG TABS tablet TAKE 1 TABLET BY MOUTH TWICE A DAY 02/12/22   Deberah Pelton, NP  levothyroxine (SYNTHROID) 88 MCG tablet TAKE 1 TABLET BY MOUTH EVERY DAY BEFORE BREAKFAST 02/12/22   Wendie Agreste, MD  metoprolol succinate (TOPROL-XL) 25 MG 24 hr tablet TAKE 1 TABLET BY MOUTH TWICE A DAY 06/15/21   Buford Dresser, MD  Multiple Vitamin (MULTIVITAMIN WITH MINERALS) TABS tablet Take 2 tablets by mouth daily. Gummies    [provider]  naphazoline-glycerin (CLEAR EYES) 0.012-0.2 % SOLN Place 1-2 drops into both eyes every 4 (four) hours as needed for irritation.    [provider]  Potassium Gluconate 550 MG TABS Take 550 mg by mouth daily.    [provider]  rosuvastatin (CRESTOR) 10 MG tablet TAKE 1 TABLET BY MOUTH EVERY DAY 12/22/21   Allwardt, Alyssa M, PA-C  triamterene-hydrochlorothiazide (MAXZIDE-25) 37.5-25 MG tablet Take 1 tablet by mouth daily. 09/06/21   Allwardt, Randa Evens, PA-C      Allergies     Ursodiol    Review of Systems   Review of Systems  All other systems reviewed and are negative.  Physical Exam Updated Vital Signs BP 116/78    Pulse (!) 116    Temp 97.6 F (36.4 C)    Resp 16    Ht 1.651 m (5\' 5" )    Wt 68 kg    SpO2 96%    BMI 24.96 kg/m  Physical Exam Vitals and nursing note reviewed.  Constitutional:      General: She is not in acute distress.    Appearance: Normal appearance. She is well-developed. She is not toxic-appearing.  HENT:     Head: Normocephalic and atraumatic.  Eyes:     General: Lids are normal.     Conjunctiva/sclera: Conjunctivae normal.     Pupils: Pupils are equal, round, and reactive to light.  Neck:     Thyroid: No thyroid mass.     Trachea: No tracheal deviation.  Cardiovascular:     Rate and Rhythm: Tachycardia present. Rhythm irregular.     Heart sounds: Normal heart sounds. No murmur heard.   No gallop.  Pulmonary:     Effort: Pulmonary effort is normal. No respiratory distress.     Breath sounds: Normal breath sounds. No stridor. No decreased breath sounds, wheezing, rhonchi or rales.  Abdominal:     General: There is no  distension.     Palpations: Abdomen is soft.     Tenderness: There is no abdominal tenderness. There is no rebound.  Musculoskeletal:        General: No tenderness. Normal range of motion.     Cervical back: Normal range of motion and neck supple.  Skin:    General: Skin is warm and dry.     Findings: No abrasion or rash.  Neurological:     Mental Status: She is alert and oriented to person, place, and time. Mental status is at baseline.     GCS: GCS eye subscore is 4. GCS verbal subscore is 5. GCS motor subscore is 6.     Cranial Nerves: No cranial nerve deficit.     Sensory: No sensory deficit.     Motor: Motor function is intact.  Psychiatric:        Attention and Perception: Attention normal.        Speech: Speech normal.        Behavior: Behavior normal.    ED Results / Procedures / Treatments    Labs (all labs ordered are listed, but only abnormal results are displayed) Labs Reviewed  RESP PANEL BY RT-PCR (FLU A&B, COVID) ARPGX2  CBC WITH DIFFERENTIAL/PLATELET  COMPREHENSIVE METABOLIC PANEL  BRAIN NATRIURETIC PEPTIDE    EKG EKG Interpretation  Date/Time:  Thursday February 15 2022 13:47:50 EST Ventricular Rate:  114 PR Interval:    QRS Duration: 74 QT Interval:  328 QTC Calculation: 452 R Axis:   58 Text Interpretation: Atrial fibrillation with rapid ventricular response Nonspecific ST abnormality Abnormal ECG When compared with ECG of 27-Apr-2021 20:46, Atrial fibrillation has replaced Sinus rhythm Vent. rate has increased BY  38 BPM Nonspecific T wave abnormality no longer evident in Inferior leads Confirmed by Lacretia Leigh ([id number]) on 02/15/2022 2:03:34 PM  Radiology No results found.  Procedures Procedures    Medications Ordered in ED Medications  lactated ringers infusion (has no administration in time range)  metoprolol tartrate (LOPRESSOR) injection 5 mg (has no administration in time range)    ED Course/ Medical Decision Making/ A&P                           Medical Decision Making Amount and/or Complexity of Data Reviewed Labs: ordered. Radiology: ordered.  Risk Prescription drug management.   Patient known history of A-fib and presented short of breath.  Chest x-ray without acute findings per radiology report as well as my interpretation.  Patient has evidence of RVR and given IV metoprolol here 5 mg.  Laboratory studies are pending at this time.  Care turned over to Dr. Langston Masker        Final Clinical Impression(s) / ED Diagnoses Final diagnoses:  None    Rx / DC Orders ED Discharge Orders     None         Lacretia Leigh, MD 02/15/22 1524

## 2022-02-15 NOTE — ED Provider Notes (Signed)
I assumed care of this patient from Dr. Zenia Resides.  Briefly she is 38 on Eliquis with a history of A-fib on metoprolol twice daily, also on Multaq, presenting to the emergency department with palpitations and shortness of breath.  She was found to be in A-fib with borderline RVR, heart rate of 120-130 on arrival.  She was given IV metoprolol here; had some improvement of her heart rate to about 100 to 110 bpm.  I subsequently gave her 30 mg of oral diltiazem, which she has been on as a rescue medication in the past (but ran out of this medicine), with heart rate improvement to approximately 80 bpm, blood pressure stable, and her feeling well.  The rest of her work-up was unremarkable including CT PE showing no evidence of pneumonia or pulmonary embolism.  Advise follow-up with a cardiologist tomorrow, will represcribe diltiazem as needed for rescue medication, continue her metoprolol at home.  She appears to be in stable rate controlled A-fib otherwise.   Wyvonnia Dusky, MD 02/15/22 2002

## 2022-02-15 NOTE — Discharge Instructions (Signed)
Take your evening metoprolol dose as regularly scheduled at bedtime.  I prescribed a medicine called diltiazem which you can use as a rescue medication in the future.  If you have A-fib with a heart rate above 120 bpm on your monitor, or especially if you have palpitations and lightheadedness, you can take 1 dose of the diltiazem.  Wait 30 minutes.  If your heart rate is still running fast, or if you begin feeling very lightheaded or worse, please call 911 and come back to the ER.

## 2022-02-15 NOTE — ED Triage Notes (Signed)
Started  to feel sob this am ,   has had a cough  for last couple of days, has had a sore throat and a h/a

## 2022-02-16 ENCOUNTER — Telehealth (HOSPITAL_BASED_OUTPATIENT_CLINIC_OR_DEPARTMENT_OTHER): Payer: Self-pay | Admitting: Cardiology

## 2022-02-16 NOTE — Telephone Encounter (Signed)
Keep appt 02/27/22 as scheduled. Continue Eliquis 5mg  BID, Toprol 25mg  QD, Multaq 400mg  BID.   Recommend checking heart rate once daily and keeping a log. Utilize Diltiazem (as prescribed by ED provider) as needed for HR >120bpm. If heart rate does not improve after 30 minutes of taking Diltiazem, may take an additional half tablet of Toprol.  Please ask her to contact our office early next week with heart rate log so we can ensure heart rate is well controlled.  Loel Dubonnet, NP

## 2022-02-16 NOTE — Telephone Encounter (Signed)
Husband was calling in to inform us the patient went to the ER last night for high HR. Was calling in to get patient a follow up. But patient already have appt on 3/7. Please look at ER paper to see if she needs to be moved up to something sooner. Please advise

## 2022-02-16 NOTE — Telephone Encounter (Signed)
RN called and updated patient and husband with recommendations. Patient verbalizes understanding  HR from today! Am 71, 75, 131- took a Diltiazem (1100)   RN to call early next week to check in with patient!   "Keep appt 02/27/22 as scheduled. Continue Eliquis 5mg  BID, Toprol 25mg  QD, Multaq 400mg  BID.    Recommend checking heart rate once daily and keeping a log. Utilize Diltiazem (as prescribed by ED provider) as needed for HR >120bpm. If heart rate does not improve after 30 minutes of taking Diltiazem, may take an additional half tablet of Toprol.   Please ask her to contact our office early next week with heart rate log so we can ensure heart rate is well controlled.   Loel Dubonnet, NP "

## 2022-02-19 MED ORDER — METOPROLOL SUCCINATE ER 50 MG PO TB24: 50.0000 mg | ORAL_TABLET | Freq: Two times a day (BID) | ORAL | 6 refills | Status: DC

## 2022-02-19 NOTE — Telephone Encounter (Signed)
Fri- 71, 75, 131- took dilt,  118, 77  Sat- 129 took dilt, 127 half metoprolol, 128, 111, 118  Sun- 84, 112, 122 took dilt, 108,   Mon- 88, took am meds, 123, 133 took half metop

## 2022-02-19 NOTE — Telephone Encounter (Signed)
Recommend increase Metoprolol Succinate to 50mg  daily. (If she already took 25mg  this morning, go ahead and take another half). Continue Diltiazem 30mg  PRN for heart rate >120 bpm. May take every 6 hours as needed. Contact our office Thursday or Friday with updated heart rate log.   Follow up as scheduled next week with Dr. Harrell Gave.   Loel Dubonnet, NP

## 2022-02-19 NOTE — Telephone Encounter (Signed)
Patient's husband calling back to follow up.

## 2022-02-19 NOTE — Addendum Note (Signed)
Addended by: Gerald Stabs on: 02/19/2022 12:10 PM   Modules accepted: Orders

## 2022-02-19 NOTE — Telephone Encounter (Signed)
Recommendations called to patient's husband. He is to follow up on Thursday with updated Blood pressure readings!       "Recommend increase Metoprolol Succinate to 50mg  daily. (If she already took 25mg  this morning, go ahead and take another half).  Continue Diltiazem 30mg  PRN for heart rate >120 bpm. May take every 6 hours as needed.  Contact our office Thursday or Friday with updated heart rate log.    Follow up as scheduled next week with Dr. Harrell Gave.    Loel Dubonnet, NP"    Verbal per Laurann Montana, NP check He twice per day unless she feels like her heart is racing

## 2022-02-22 ENCOUNTER — Encounter (HOSPITAL_BASED_OUTPATIENT_CLINIC_OR_DEPARTMENT_OTHER): Payer: Self-pay

## 2022-02-22 NOTE — Telephone Encounter (Signed)
RN called patient and updated him! Patient is very happy that his wife is doing better and is looking forward to her appointment on Tuesday!  ? ? ? ?Heart rate definitely improved today. Hopefully it continues. Continue current medications and follow up as scheduled.  ?  ?Paige Dubonnet, NP  ?

## 2022-02-22 NOTE — Telephone Encounter (Signed)
See telephone encounter RN spoke with patient!  ?

## 2022-02-22 NOTE — Telephone Encounter (Signed)
Follow up HR log  ?

## 2022-02-22 NOTE — Telephone Encounter (Signed)
Heart rate definitely improved today. Hopefully it continues. Continue current medications and follow up as scheduled.  ? ?Loel Dubonnet, NP  ?

## 2022-02-22 NOTE — Telephone Encounter (Signed)
? ? ?  STAT if HR is under 50 or over 120 ?(normal HR is 60-100 beats per minute) ? ?What is your heart rate? 72 ? ?Do you have a log of your heart rate readings (document readings)?  ?02/19/22: 108 ?02/20/22 110 104 117 ?02/21/22 123 took extra medicine (as directed when HR was elevated), 3 hr later 97/72 HR 123 ?02/22/22 70, 72 ? ?Do you have any other symptoms? No ? ?Patient's husband said the increase in medication didn't really do much the first couple days but it seems to be working now  ?

## 2022-02-26 ENCOUNTER — Ambulatory Visit: Payer: Medicare Other | Admitting: Physician Assistant

## 2022-02-27 ENCOUNTER — Ambulatory Visit: Payer: Medicare Other | Admitting: Physician Assistant

## 2022-02-27 ENCOUNTER — Other Ambulatory Visit: Payer: Self-pay

## 2022-02-27 ENCOUNTER — Encounter (HOSPITAL_BASED_OUTPATIENT_CLINIC_OR_DEPARTMENT_OTHER): Payer: Self-pay | Admitting: Cardiology

## 2022-02-27 ENCOUNTER — Ambulatory Visit (INDEPENDENT_AMBULATORY_CARE_PROVIDER_SITE_OTHER): Payer: Medicare Other | Admitting: Cardiology

## 2022-02-27 VITALS — BP 110/62 | HR 55 | Ht 65.0 in | Wt 155.3 lb

## 2022-02-27 DIAGNOSIS — I480 Paroxysmal atrial fibrillation: Secondary | ICD-10-CM | POA: Diagnosis not present

## 2022-02-27 DIAGNOSIS — I10 Essential (primary) hypertension: Secondary | ICD-10-CM | POA: Diagnosis not present

## 2022-02-27 DIAGNOSIS — D6869 Other thrombophilia: Secondary | ICD-10-CM | POA: Diagnosis not present

## 2022-02-27 DIAGNOSIS — Z7901 Long term (current) use of anticoagulants: Secondary | ICD-10-CM | POA: Diagnosis not present

## 2022-02-27 DIAGNOSIS — E7800 Pure hypercholesterolemia, unspecified: Secondary | ICD-10-CM

## 2022-02-27 NOTE — Progress Notes (Signed)
Cardiology Office Note:    Date:  02/27/2022   ID:  Paige Levy, DOB 03-08-1941, MRN [id number]  PCP:  Paige Lathe, PA-C  Cardiologist:  Buford Dresser, MD  CC: follow up  History of Present Illness:    Paige Levy is a 81 y.o. female with a hx of paroxysmal atrial fibrillation, hypothyroidism, hyperlipidemia, hypertension who is seen for follow up today. I initially met her 02/27/21 as a new consult for the evaluation and management of atrial fibrillation.  Today: She presented to the ED 02/15/22 with palpitations and shortness of breath. She was found to be in Afib with borderline RVR and heart rate of 120-130 bpm. She was given IV metoprolol and 30 mg oral diltiazem. Of note, she had used diltiazem in the past as a rescue medication but ran out. She was discharged in stable condition with re-prescribed diltiazem and metoprolol.  She is accompanied by her husband. Recently, her heart rate at home is running normal. Prior to her ED visit, her body felt heavy and weak. She was also stressed about financial issues. Her husband reports she had a frequent cough that caused her to lose her voice. However, she reports her cough has resolved.   Her appetite is good and she drinks water throughout the day. Her husband reports their portions are smaller than in the past. She endorses feeling chills on the blood thinner.   Denies chest pain, shortness of breath at rest or with normal exertion. No PND, orthopnea, LE edema or unexpected weight gain. No syncope.  Past Medical History:  Diagnosis Date   Arthritis    Atrial fibrillation ([place])    Hyperlipemia    Hypothyroidism    Meniere's disease    Thyroid disease     Past Surgical History:  Procedure Laterality Date   ABDOMINAL HYSTERECTOMY     CARDIOVERSION N/A 03/21/2021   Procedure: CARDIOVERSION;  Surgeon: Sanda Klein, MD;  Location: [facility];  Service: Cardiovascular;  Laterality: N/A;   ERCP Left  10/03/2014   Procedure: ENDOSCOPIC RETROGRADE CHOLANGIOPANCREATOGRAPHY (ERCP);  Surgeon: Missy Sabins, MD;  Location: [facility];  Service: Gastroenterology;  Laterality: Left;   ERCP N/A 01/14/2015   Procedure: ENDOSCOPIC RETROGRADE CHOLANGIOPANCREATOGRAPHY (ERCP);  Surgeon: Jeryl Columbia, MD;  [facility];  Service: Endoscopy;  Laterality: N/A;  need lithotripter/ordered/LH   SPHINCTEROTOMY  09/2014   SPYGLASS LITHOTRIPSY N/A 01/14/2015   Procedure: [id number] LITHOTRIPSY;  Surgeon: Jeryl Columbia, MD;  Location: [facility] ENDOSCOPY;  Service: Endoscopy;  Laterality: N/A;    Current Medications: Current Outpatient Medications on File Prior to Visit  Medication Sig   diltiazem (CARDIZEM) 30 MG tablet Take 1 tablet (30 mg total) by mouth daily as needed for up to 30 doses. For heart rate above 120 beats per minute (rapid Atrial fibrillation)   dronedarone (MULTAQ) 400 MG tablet Take 1 tablet (400 mg total) by mouth 2 (two) times daily with a meal.   ELIQUIS 5 MG TABS tablet TAKE 1 TABLET BY MOUTH TWICE A DAY   levothyroxine (SYNTHROID) 88 MCG tablet TAKE 1 TABLET BY MOUTH EVERY DAY BEFORE BREAKFAST   metoprolol succinate (TOPROL-XL) 50 MG 24 hr tablet Take 1 tablet (50 mg total) by mouth in the morning and at bedtime.   Multiple Vitamin (MULTIVITAMIN WITH MINERALS) TABS tablet Take 2 tablets by mouth daily. Gummies   naphazoline-glycerin (CLEAR EYES) 0.012-0.2 % SOLN Place 1-2 drops into both eyes every 4 (four) hours as needed for irritation.  Potassium Gluconate 550 MG TABS Take 550 mg by mouth daily.   rosuvastatin (CRESTOR) 10 MG tablet TAKE 1 TABLET BY MOUTH EVERY DAY   triamterene-hydrochlorothiazide (MAXZIDE-25) 37.5-25 MG tablet Take 1 tablet by mouth daily.   No current facility-administered medications on file prior to visit.     Allergies:   Ursodiol   Social History   Tobacco Use   Smoking status: Never   Smokeless tobacco: Never  Substance Use Topics   Alcohol use: No    Drug use: No    Family History: family history includes Heart attack in her mother; Other in her father and mother.  ROS:   Please see the history of present illness.  Additional pertinent ROS otherwise unremarkable. (+) Chills  EKGs/Labs/Other Studies Reviewed:    The following studies were reviewed today: CTA Chest 02/15/22 IMPRESSION: No evidence of pulmonary embolism.   Tiny hiatal hernia.   Aortic Atherosclerosis (ICD10-I70.0).  Echo 03/24/21  1. Left ventricular ejection fraction, by estimation, is 55 to 60%. The  left ventricle has normal function. The left ventricle has no regional  wall motion abnormalities. Left ventricular diastolic parameters are  indeterminate.   2. Right ventricular systolic function is normal. The right ventricular  size is normal.   3. The mitral valve is normal in structure. Mild mitral valve  regurgitation. No evidence of mitral stenosis.   4. The aortic valve is tricuspid. Aortic valve regurgitation is not  visualized. Mild to moderate aortic valve sclerosis/calcification is  present, without any evidence of aortic stenosis.   5. The inferior vena cava is normal in size with greater than 50%  respiratory variability, suggesting right atrial pressure of 3 mmHg.  EKG:  EKG is personally reviewed. 02/27/22: Sinus bradycardia, rate 55 bpm 08/30/21 SR with PACs, 65 bpm 03/29/21 sinus rhythm with PACs at 63 bpm. 02/27/21: atrial fibrillation with RVR at 106 bpm  Recent Labs: 04/10/2021: TSH 2.138 04/27/2021: Magnesium 1.8 02/15/2022: ALT 11; B Natriuretic Peptide 249.8; BUN 18; Creatinine, Ser 0.95; Hemoglobin 14.6; Platelets 222; Potassium 3.9; Sodium 139  Recent Lipid Panel    Component Value Date/Time   CHOL 151 02/08/2021 1039   TRIG 108.0 02/08/2021 1039   HDL 57.50 02/08/2021 1039   CHOLHDL 3 02/08/2021 1039   VLDL 21.6 02/08/2021 1039   LDLCALC 72 02/08/2021 1039   LDLCALC 195 (H) 08/03/2020 1029    Physical Exam:    VS:  BP 110/62  (BP Location: Left Arm, Patient Position: Sitting, Cuff Size: Normal)    Pulse (!) 55    Ht $R'5\' 5"'Bg$  (1.651 m)    Wt 155 lb 4.8 oz (70.4 kg)    BMI 25.84 kg/m     Wt Readings from Last 3 Encounters:  02/27/22 155 lb 4.8 oz (70.4 kg)  02/15/22 150 lb (68 kg)  11/23/21 151 lb 3.2 oz (68.6 kg)    GEN: Well nourished, well developed in no acute distress HEENT: Normal, moist mucous membranes NECK: No JVD CARDIAC: regular rhythm, normal S1 and S2, no rubs or gallops. No murmur. VASCULAR: Radial and DP pulses 2+ bilaterally. No carotid bruits RESPIRATORY:  Clear to auscultation without rales, wheezing or rhonchi  ABDOMEN: Soft, non-tender, non-distended MUSCULOSKELETAL:  Ambulates independently SKIN: Warm and dry, no edema NEUROLOGIC:  Alert and oriented x 3. No focal neuro deficits noted. PSYCHIATRIC:  Normal affect    ASSESSMENT:    1. Paroxysmal atrial fibrillation (HCC)   2. Primary hypertension   3. Secondary hypercoagulable state ([place])  4. Current use of long term anticoagulation   5. Pure hypercholesterolemia     PLAN:    atrial fibrillation, paroxysmal: -CHA2DS2/VAS Stroke Risk Points=3 -continue apixaban 5 mg BID (only meets age criteria for reduction, not weight/Cr) -echo unremarkable -recently elevated HR, found to be in afib, felt weak/tired. Increased metoprolol, given PRN diltiazem (not used), now back in sinus bradycardia. Instructed that if this occurs again, would increase metoprolol dose for mild elevation in HR (100-120) but if very elevated would use the diltiazem as it is faster and shorter acting -on dronedarone, metoprolol succinate long term  Hypertension:  -at goal today -has history of hypokalemia. May need to change from triamterene-HCTZ if electrolytes are abnormal  Hypercholesterolemia: -continue rosuvastatin 10 mg daily  Cardiac risk counseling and prevention recommendations: -recommend heart healthy/Mediterranean diet, with whole grains, fruits,  vegetable, fish, lean meats, nuts, and olive oil. Limit salt. -recommend moderate walking, 3-5 times/week for 30-50 minutes each session. Aim for at least 150 minutes.week. Goal should be pace of 3 miles/hours, or walking 1.5 miles in 30 minutes -recommend avoidance of tobacco products. Avoid excess alcohol.  Plan for follow up: 6 months or sooner as needed  Buford Dresser, MD, PhD, [facility]    Medication Adjustments/Labs and Tests Ordered: Current medicines are reviewed at length with the patient today.  Concerns regarding medicines are outlined above.  Orders Placed This Encounter  Procedures   EKG 12-Lead    No orders of the defined types were placed in this encounter.  I,Mykaella Javier,acting as a Education administrator for [employer], MD.,have documented all relevant documentation on the behalf of Buford Dresser, MD,as directed by  Buford Dresser, MD while in the presence of Buford Dresser, MD.  I, Buford Dresser, MD, have reviewed all documentation for this visit. The documentation on 02/27/22 for the exam, diagnosis, procedures, and orders are all accurate and complete.   Patient Instructions  Medication Instructions:  Your Physician recommend you continue on your current medication as directed.    *If you need a refill on your cardiac medications before your next appointment, please call your pharmacy*   Lab Work: None ordered today   Testing/Procedures: None ordered today   Follow-Up: At [facility], you and your health needs are our priority.  As part of our continuing mission to provide you with exceptional heart care, we have created designated Provider Care Teams.  These Care Teams include your primary Cardiologist (physician) and Advanced Practice Providers (APPs -  Physician Assistants and Nurse Practitioners) who all work together to provide you with the care you need, when you need it.  We recommend  signing up for the patient portal called "MyChart".  Sign up information is provided on this After Visit Summary.  MyChart is used to connect with patients for Virtual Visits (Telemedicine).  Patients are able to view lab/test results, encounter notes, upcoming appointments, etc.  Non-urgent messages can be sent to your provider as well.   To learn more about what you can do with MyChart, go to [email or web address].    Your next appointment:   6 month(s)  The format for your next appointment:   In Person  Provider:   Buford Dresser, MD      Signed, Buford Dresser, MD PhD 02/27/2022    [place]

## 2022-02-27 NOTE — Patient Instructions (Signed)

## 2022-02-28 ENCOUNTER — Ambulatory Visit: Payer: Medicare Other | Admitting: Physician Assistant

## 2022-03-21 ENCOUNTER — Ambulatory Visit: Payer: Medicare Other | Admitting: Physician Assistant

## 2022-03-28 ENCOUNTER — Ambulatory Visit (INDEPENDENT_AMBULATORY_CARE_PROVIDER_SITE_OTHER): Payer: Medicare Other | Admitting: Physician Assistant

## 2022-03-28 VITALS — BP 130/78 | HR 59 | Temp 98.0°F | Ht 65.0 in | Wt 151.0 lb

## 2022-03-28 DIAGNOSIS — I10 Essential (primary) hypertension: Secondary | ICD-10-CM

## 2022-03-28 DIAGNOSIS — E038 Other specified hypothyroidism: Secondary | ICD-10-CM

## 2022-03-28 DIAGNOSIS — E785 Hyperlipidemia, unspecified: Secondary | ICD-10-CM | POA: Diagnosis not present

## 2022-03-28 DIAGNOSIS — I4891 Unspecified atrial fibrillation: Secondary | ICD-10-CM

## 2022-03-28 LAB — COMPREHENSIVE METABOLIC PANEL
ALT: 13 U/L (ref 0–35)
AST: 24 U/L (ref 0–37)
Albumin: 4.3 g/dL (ref 3.5–5.2)
Alkaline Phosphatase: 50 U/L (ref 39–117)
BUN: 12 mg/dL (ref 6–23)
CO2: 32 mEq/L (ref 19–32)
Calcium: 9.5 mg/dL (ref 8.4–10.5)
Chloride: 99 mEq/L (ref 96–112)
Creatinine, Ser: 0.89 mg/dL (ref 0.40–1.20)
GFR: 60.33 mL/min (ref >60.00)
Glucose, Bld: 95 mg/dL (ref 70–99)
Potassium: 3.9 mEq/L (ref 3.5–5.1)
Sodium: 138 mEq/L (ref 135–145)
Total Bilirubin: 1.4 mg/dL — ABNORMAL HIGH (ref 0.2–1.2)
Total Protein: 6.7 g/dL (ref 6.0–8.3)

## 2022-03-28 LAB — LIPID PANEL
Cholesterol: 165 mg/dL (ref 0–200)
HDL: 62.4 mg/dL (ref >39.00)
LDL Cholesterol: 79 mg/dL (ref 0–99)
NonHDL: 102.55
Total CHOL/HDL Ratio: 3
Triglycerides: 120 mg/dL (ref 0.0–149.0)
VLDL: 24 mg/dL (ref 0.0–40.0)

## 2022-03-28 LAB — CBC WITH DIFFERENTIAL/PLATELET
Basophils Absolute: 0.1 10*3/uL (ref 0.0–0.1)
Basophils Relative: 0.8 % (ref 0.0–3.0)
Eosinophils Absolute: 0.1 10*3/uL (ref 0.0–0.7)
Eosinophils Relative: 0.8 % (ref 0.0–5.0)
HCT: 41.6 % (ref 36.0–46.0)
Hemoglobin: 13.9 g/dL (ref 12.0–15.0)
Lymphocytes Relative: 25.2 % (ref 12.0–46.0)
Lymphs Abs: 1.7 10*3/uL (ref 0.7–4.0)
MCHC: 33.4 g/dL (ref 30.0–36.0)
MCV: 93 fl (ref 78.0–100.0)
Monocytes Absolute: 0.5 10*3/uL (ref 0.1–1.0)
Monocytes Relative: 7.2 % (ref 3.0–12.0)
Neutro Abs: 4.4 10*3/uL (ref 1.4–7.7)
Neutrophils Relative %: 66 % (ref 43.0–77.0)
Platelets: 204 10*3/uL (ref 150.0–400.0)
RBC: 4.47 Mil/uL (ref 3.87–5.11)
RDW: 13.4 % (ref 11.5–15.5)
WBC: 6.7 10*3/uL (ref 4.0–10.5)

## 2022-03-28 LAB — TSH: TSH: 3.22 u[IU]/mL (ref 0.35–5.50)

## 2022-03-28 NOTE — Patient Instructions (Addendum)
Talk to your pharmacist about getting Shingrix  ? ?Think about bone density test for next year  ?

## 2022-03-28 NOTE — Progress Notes (Signed)
? ?Subjective:  ? ? Patient ID: Paige Levy, female    DOB: 1941-03-12, 81 y.o.   MRN: [id number] ? ?Chief Complaint  ?Patient presents with  ? Follow-up  ? ? ?HPI ?Patient is in today for regular 6 month f/up. Enjoys walking dog twice daily. Eats at home mostly, well-balanced she would say.  ? ?No acute concerns today.  She has been working with her cardiologist about her A-fib.  Recent emergency department visit on 02/15/2022.  States that since then Dr. Harrell Gave has been adjusting her medications and she has been doing better. ? ?See A/P for details. ? ?Past Medical History:  ?Diagnosis Date  ? Arthritis   ? Atrial fibrillation ([place])   ? Hyperlipemia   ? Hypothyroidism   ? Meniere's disease   ? Thyroid disease   ? ? ?Past Surgical History:  ?Procedure Laterality Date  ? ABDOMINAL HYSTERECTOMY    ? CARDIOVERSION N/A 03/21/2021  ? Procedure: CARDIOVERSION;  Surgeon: Sanda Klein, MD;  Location: [facility];  Service: Cardiovascular;  Laterality: N/A;  ? ERCP Left 10/03/2014  ? Procedure: ENDOSCOPIC RETROGRADE CHOLANGIOPANCREATOGRAPHY (ERCP);  Surgeon: Missy Sabins, MD;  Location: [place];  Service: Gastroenterology;  Laterality: Left;  ? ERCP N/A 01/14/2015  ? Procedure: ENDOSCOPIC RETROGRADE CHOLANGIOPANCREATOGRAPHY (ERCP);  Surgeon: Jeryl Columbia, MD;  Location: [facility] ENDOSCOPY;  Service: Endoscopy;  Laterality: N/A;  need lithotripter/ordered/LH  ? SPHINCTEROTOMY  09/2014  ? SPYGLASS LITHOTRIPSY N/A 01/14/2015  ? Procedure: SPYGLASS LITHOTRIPSY;  Surgeon: Jeryl Columbia, MD;  Location: [facility] ENDOSCOPY;  Service: Endoscopy;  Laterality: N/A;  ? ? ?Family History  ?Problem Relation Age of Onset  ? Other Mother   ? Heart attack Mother   ? Other Father   ? ? ?Social History  ? ?Tobacco Use  ? Smoking status: Never  ? Smokeless tobacco: Never  ?Substance Use Topics  ? Alcohol use: No  ? Drug use: No  ?  ? ?Allergies  ?Allergen Reactions  ? Ursodiol Other (See Comments)  ?  Severe Dizziness  ? ? ?Review of  Systems ?NEGATIVE UNLESS OTHERWISE INDICATED IN HPI ? ? ?   ?Objective:  ?  ? ?BP 130/78   Pulse (!) 59   Temp 98 ?F (36.7 ?C)   Ht '5\' 5"'$  (1.651 m)   Wt 151 lb (68.5 kg)   SpO2 98%   BMI 25.13 kg/m?  ? ?Wt Readings from Last 3 Encounters:  ?03/28/22 151 lb (68.5 kg)  ?02/27/22 155 lb 4.8 oz (70.4 kg)  ?02/15/22 150 lb (68 kg)  ? ? ?BP Readings from Last 3 Encounters:  ?03/28/22 130/78  ?02/27/22 110/62  ?02/15/22 111/77  ?  ? ?Physical Exam ?Vitals and nursing note reviewed.  ?Constitutional:   ?   Appearance: Normal appearance. She is normal weight. She is not toxic-appearing.  ?HENT:  ?   Head: Normocephalic and atraumatic.  ?   Nose: Nose normal.  ?   Mouth/Throat:  ?   Mouth: Mucous membranes are moist.  ?Eyes:  ?   Extraocular Movements: Extraocular movements intact.  ?   Conjunctiva/sclera: Conjunctivae normal.  ?   Pupils: Pupils are equal, round, and reactive to light.  ?Cardiovascular:  ?   Rate and Rhythm: Regular rhythm. Bradycardia present.  ?   Pulses: Normal pulses.  ?   Heart sounds: Normal heart sounds.  ?Pulmonary:  ?   Effort: Pulmonary effort is normal.  ?   Breath sounds: Normal breath sounds.  ?Musculoskeletal:     ?  General: Normal range of motion.  ?   Cervical back: Normal range of motion and neck supple.  ?   Comments: Arthritic changes noted in hands but does not complain of much pain  ?Skin: ?   General: Skin is warm and dry.  ?Neurological:  ?   General: No focal deficit present.  ?   Mental Status: She is alert and oriented to person, place, and time.  ?Psychiatric:     ?   Mood and Affect: Mood normal.     ?   Behavior: Behavior normal.     ?   Thought Content: Thought content normal.     ?   Judgment: Judgment normal.  ? ? ?   ?Assessment & Plan:  ? ?Problem List Items Addressed This Visit   ? ?  ? Cardiovascular and Mediastinum  ? Atrial fibrillation ([place])  ? Relevant Orders  ? CBC with Differential/Platelet  ? Comprehensive metabolic panel  ? Essential hypertension - Primary   ? Relevant Orders  ? CBC with Differential/Platelet  ? Comprehensive metabolic panel  ?  ? Endocrine  ? Hypothyroidism  ? Relevant Orders  ? TSH  ?  ? Other  ? Dyslipidemia (Chronic)  ? Relevant Orders  ? Lipid panel  ? ?1. Essential hypertension ?Blood pressure is stable and at goal. ?She will continue Maxzide 7.5-25 mg 1 tablet daily ?Toprol XL 50 mg BID ?Monitor at home ?Call for refills  ? ?2. Other specified hypothyroidism ?Recheck TSH today ?Levothyroxine 88 mcg ? ?3. Dyslipidemia ?Recheck fasting lipid panel ?Currently on Crestor 10 mg daily, adjust as needed ? ?4. Atrial fibrillation, unspecified type ([place]) ?Stable at this time ?She continues regular follow-up with cardiology Dr. Harrell Gave ?Recent increase in metoprolol ?Diltiazem 30 mg prn ? Eliquis 5 mg BID  ? ?Overall healthy 81 year old female taking good care of herself.  Plan for next follow-up in about 6 to 12 months or as needed. ? ? ?This note was prepared with assistance of Systems analyst. Occasional wrong-word or sound-a-like substitutions may have occurred due to the inherent limitations of voice recognition software. ? ? ? ?Tarynn Garling M Quamesha Mullet, PA-C ?

## 2022-05-03 ENCOUNTER — Other Ambulatory Visit: Payer: Self-pay | Admitting: Physician Assistant

## 2022-05-08 ENCOUNTER — Telehealth (HOSPITAL_BASED_OUTPATIENT_CLINIC_OR_DEPARTMENT_OTHER): Payer: Self-pay | Admitting: Cardiology

## 2022-05-08 MED ORDER — METOPROLOL SUCCINATE ER 50 MG PO TB24: 50.0000 mg | ORAL_TABLET | Freq: Two times a day (BID) | ORAL | 2 refills | Status: DC

## 2022-05-08 NOTE — Telephone Encounter (Signed)
Rx(s) sent to pharmacy electronically.  

## 2022-05-08 NOTE — Telephone Encounter (Signed)
?*  STAT* If patient is at the pharmacy, call can be transferred to refill team. ? ? ?1. Which medications need to be refilled? (please list name of each medication and dose if known) metoprolol succinate (TOPROL-XL) 50 MG 24 hr tablet ? ?2. Which pharmacy/location (including street and city if local pharmacy) is medication to be sent to? [id number] [place], [place] - [address] ? ?3. Do they need a 30 day or 90 day supply? 90 ? ?Husband calling to to get update prescription over to the pharmacy because they have the one that says '25mg'$  ? ?

## 2022-05-11 ENCOUNTER — Telehealth: Payer: Self-pay | Admitting: Cardiology

## 2022-05-11 ENCOUNTER — Other Ambulatory Visit (HOSPITAL_BASED_OUTPATIENT_CLINIC_OR_DEPARTMENT_OTHER): Payer: Self-pay | Admitting: *Deleted

## 2022-05-11 MED ORDER — METOPROLOL SUCCINATE ER 25 MG PO TB24: 25.0000 mg | ORAL_TABLET | Freq: Two times a day (BID) | ORAL | 2 refills | Status: DC

## 2022-05-11 NOTE — Telephone Encounter (Signed)
Rx(s) sent to pharmacy electronically.  

## 2022-05-11 NOTE — Telephone Encounter (Signed)
*  STAT* If patient is at the pharmacy, call can be transferred to refill team.   1. Which medications need to be refilled? (please list name of each medication and dose if known)   metoprolol succinate (TOPROL-XL) 50 MG 24 hr tablet    2. Which pharmacy/location (including street and city if local pharmacy) is medication to be sent to? [id number]- [place], [place] - [address]  3. Do they need a 30 day or 90 day supply?  90 day   Pt's husband states that pharmacy is saying they have not received prescription. Please advise

## 2022-05-11 NOTE — Telephone Encounter (Signed)
Rx(s) sent to pharmacy electronically. 25 mg metoprolol succinate send into pt pharmacy

## 2022-05-11 NOTE — Telephone Encounter (Signed)
Pt husband stated that his wife takes 25 mg of metoprolol and not 50 mg which was increase back in February, he stated that she only take 50 mg for 2 days then went back to the 25 mg tablets, spoke with Dr Harrell Gave who stated it ok for pt to continue with the 25 mg tablet, new rx send to pt pharmacy.

## 2022-05-11 NOTE — Telephone Encounter (Signed)
*  STAT* If patient is at the pharmacy, call can be transferred to refill team.   1. Which medications need to be refilled? (please list name of each medication and dose if known)  Metoprolol 25 MG tablet  2. Which pharmacy/location (including street and city if local pharmacy) is medication to be sent to? [id number]- [place], [place] - [address]  3. Do they need a 30 day or 90 day supply?   90 day supply  Patient's husband called to clarify that they are requesting the 25 MG tablet.

## 2022-06-19 ENCOUNTER — Telehealth (HOSPITAL_BASED_OUTPATIENT_CLINIC_OR_DEPARTMENT_OTHER): Payer: Self-pay | Admitting: Cardiology

## 2022-06-19 NOTE — Telephone Encounter (Signed)
Recommend increase Metoprolol to 37.5mg  BID. Continue additional Metoprolol, Diltiazem as needed. Recommend follow up this week with Alver Sorrow, NP or Atrial Fib clinic for EKG and assessment.   Alver Sorrow, NP

## 2022-06-21 ENCOUNTER — Ambulatory Visit (HOSPITAL_BASED_OUTPATIENT_CLINIC_OR_DEPARTMENT_OTHER): Payer: Medicare Other | Admitting: Family

## 2022-06-21 ENCOUNTER — Encounter (HOSPITAL_BASED_OUTPATIENT_CLINIC_OR_DEPARTMENT_OTHER): Payer: Self-pay | Admitting: Family

## 2022-06-21 VITALS — BP 104/74 | HR 95 | Ht 65.0 in | Wt 152.0 lb

## 2022-06-21 DIAGNOSIS — I480 Paroxysmal atrial fibrillation: Secondary | ICD-10-CM | POA: Diagnosis not present

## 2022-06-21 DIAGNOSIS — D6859 Other primary thrombophilia: Secondary | ICD-10-CM | POA: Diagnosis not present

## 2022-06-21 DIAGNOSIS — I10 Essential (primary) hypertension: Secondary | ICD-10-CM

## 2022-06-21 DIAGNOSIS — E876 Hypokalemia: Secondary | ICD-10-CM

## 2022-06-21 NOTE — Patient Instructions (Signed)
Medication Instructions:   CONTINUE Metoprolol Succinate 37.'5mg'$  (1.5 tablets) twice daily  *be sure to take  12 hours apart  CONTINUE Diltiazem '30mg'$  as needed for heart rate more than 120 bpm  *If you need a refill on your cardiac medications before your next appointment, please call your pharmacy*   Lab Work: Your physician recommends that you return for lab work today: BMP, magnesium, CBC, TSH If you have labs (blood work) drawn today and your tests are completely normal, you will receive your results only by: [place] (if you have MyChart) OR A paper copy in the mail If you have any lab test that is abnormal or we need to change your treatment, we will call you to review the results.   Testing/Procedures: EKG today shows atrial fibrillation.   Follow-Up: At [facility], you and your health needs are our priority.  As part of our continuing mission to provide you with exceptional heart care, we have created designated Provider Care Teams.  These Care Teams include your primary Cardiologist (physician) and Advanced Practice Providers (APPs -  Physician Assistants and Nurse Practitioners) who all work together to provide you with the care you need, when you need it.  We recommend signing up for the patient portal called "MyChart".  Sign up information is provided on this After Visit Summary.  MyChart is used to connect with patients for Virtual Visits (Telemedicine).  Patients are able to view lab/test results, encounter notes, upcoming appointments, etc.  Non-urgent messages can be sent to your provider as well.   To learn more about what you can do with MyChart, go to [email or web address].    Your next appointment:   Nurse visit on July 11th for EKG  AND   2 weeks after cardioversion with Dr. Harrell Gave or Laurann Montana, NP    Other Instructions You are scheduled for a Cardioversion on Thursday July 13th  with Dr. Harrell Gave.  Please arrive at the [facility]  (Main Entrance A) at [facility]: [address] [place], [place] [address] at 11:30 am. (1 hour prior to procedure unless lab work is needed; if lab work is needed arrive 1.5 hours ahead)  DIET: Nothing to eat or drink after midnight except a sip of water with medications (see medication instructions below)  FYI: For your safety, and to allow [place] to monitor your vital signs accurately during the surgery/procedure we request that   if you have artificial nails, gel coating, SNS etc. Please have those removed prior to your surgery/procedure. Not having the nail coverings /polish removed may result in cancellation or delay of your surgery/procedure.   Medication Instructions: Continue your anticoagulant: Eliquis  You will need to continue your anticoagulant after your procedure until you  are told by your  Provider that it is safe to stop   Labs: Labs done 6/29 at Visit with Laurann Montana, NP   You must have a responsible person to drive you home and stay in the waiting area during your procedure. Failure to do so could result in cancellation.  Bring your insurance cards.  *Special Note: Every effort is made to have your procedure done on time. Occasionally there are emergencies that occur at the hospital that may cause delays. Please be patient if a delay does occur.

## 2022-06-21 NOTE — Progress Notes (Signed)
Office Visit    Patient Name: Paige RYBACKI Date of Encounter: 06/23/2022  PCP:  Olive Bass   [place]  Cardiologist:  Buford Dresser, MD  Advanced Practice Provider:  No care team member to display Electrophysiologist:  None      Chief Complaint    Paige Levy is a 81 y.o. female presents today for elevated heart rate   Past Medical History    Past Medical History:  Diagnosis Date   Arthritis    Atrial fibrillation ([place])    Hyperlipemia    Hypothyroidism    Meniere's disease    Thyroid disease    Past Surgical History:  Procedure Laterality Date   ABDOMINAL HYSTERECTOMY     CARDIOVERSION N/A 03/21/2021   Procedure: CARDIOVERSION;  Surgeon: Sanda Klein, MD;  Location: [place];  Service: Cardiovascular;  Laterality: N/A;   ERCP Left 10/03/2014   Procedure: ENDOSCOPIC RETROGRADE CHOLANGIOPANCREATOGRAPHY (ERCP);  Surgeon: Missy Sabins, MD;  Location: [place];  Service: Gastroenterology;  Laterality: Left;   ERCP N/A 01/14/2015   Procedure: ENDOSCOPIC RETROGRADE CHOLANGIOPANCREATOGRAPHY (ERCP);  Surgeon: Jeryl Columbia, MD;  Location: [facility] ENDOSCOPY;  Service: Endoscopy;  Laterality: N/A;  need lithotripter/ordered/LH   SPHINCTEROTOMY  09/2014   SPYGLASS LITHOTRIPSY N/A 01/14/2015   Procedure: ZOXWRUEA LITHOTRIPSY;  Surgeon: Jeryl Columbia, MD;  Location: [facility] ENDOSCOPY;  Service: Endoscopy;  Laterality: N/A;    Allergies  Allergies  Allergen Reactions   Ursodiol Other (See Comments)    Severe Dizziness    History of Present Illness    Paige Levy is a 81 y.o. female with a hx of PAF, hypothyroidism, HLD, HTN last seen 02/27/22 by Dr. Harrell Gave.  Initial evaluation 02/27/21 as consult for atrial fibrillation. Prior cardioversion 03/2021. She was started on Multaq 06/2021.  ED visit 02/15/22 with palpitations with atrial fib with borderline RVR. She was given IV Metoprolol and '30mg'$  of Diltiazem. At follow  up with Dr. Harrell Gave 02/2022 she was in NSR and provided PRN Diltiazem.  Presents today for follow up with her husband after contacting the office noting elevated heart rate requiring PRN noses of her Metoprolol and Diltiazem. No chest pain. A bit breathless when her heart rate is elevated. This is new compared to previous. Did have a lot of stress recently which is when her previous episodes of atrial fibrillation have occurred. Her heart rate has ranged 69-120bpm. Notes BP has been well controlled at home. No missed doses of Eliquis.   EKGs/Labs/Other Studies Reviewed:   The following studies were reviewed today:   EKG:  EKG is ordered today.  The ekg ordered today demonstrates atrial fibrillation 95 bpm.   Recent Labs: 02/15/2022: B Natriuretic Peptide 249.8 03/28/2022: ALT 13 06/21/2022: BUN 15; Creatinine, Ser 1.10; Hemoglobin 14.5; Magnesium 2.2; Platelets 256; Potassium 3.6; Sodium 140; TSH 1.390  Recent Lipid Panel    Component Value Date/Time   CHOL 165 03/28/2022 1027   TRIG 120.0 03/28/2022 1027   HDL 62.40 03/28/2022 1027   CHOLHDL 3 03/28/2022 1027   VLDL 24.0 03/28/2022 1027   LDLCALC 79 03/28/2022 1027   LDLCALC 195 (H) 08/03/2020 1029    Risk Assessment/Calculations:   CHA2DS2-VASc Score = 4   This indicates a 4.8% annual risk of stroke. The patient's score is based upon: CHF History: 0 HTN History: 1 Diabetes History: 0 Stroke History: 0 Vascular Disease History: 0 Age Score: 2 Gender Score: 1     Home  Medications   Current Meds  Medication Sig   diltiazem (CARDIZEM) 30 MG tablet Take 1 tablet (30 mg total) by mouth daily as needed for up to 30 doses. For heart rate above 120 beats per minute (rapid Atrial fibrillation)   dronedarone (MULTAQ) 400 MG tablet Take 1 tablet (400 mg total) by mouth 2 (two) times daily with a meal.   ELIQUIS 5 MG TABS tablet TAKE 1 TABLET BY MOUTH TWICE A DAY   levothyroxine (SYNTHROID) 88 MCG tablet TAKE 1 TABLET BY MOUTH  EVERY DAY BEFORE BREAKFAST   metoprolol succinate (TOPROL-XL) 25 MG 24 hr tablet Take 37.5 mg by mouth in the morning and at bedtime.   Multiple Vitamin (MULTIVITAMIN WITH MINERALS) TABS tablet Take 2 tablets by mouth daily. Gummies   naphazoline-glycerin (CLEAR EYES) 0.012-0.2 % SOLN Place 1-2 drops into both eyes every 4 (four) hours as needed for irritation.   Potassium Gluconate 550 MG TABS Take 550 mg by mouth daily.   rosuvastatin (CRESTOR) 10 MG tablet TAKE 1 TABLET BY MOUTH EVERY DAY   triamterene-hydrochlorothiazide (MAXZIDE-25) 37.5-25 MG tablet TAKE 1 TABLET BY MOUTH EVERY DAY     Review of Systems      All other systems reviewed and are otherwise negative except as noted above.  Physical Exam    VS:  BP 104/74   Pulse 95   Ht '5\' 5"'$  (1.651 m)   Wt 152 lb (68.9 kg)   BMI 25.29 kg/m  , BMI Body mass index is 25.29 kg/m.  Wt Readings from Last 3 Encounters:  06/21/22 152 lb (68.9 kg)  03/28/22 151 lb (68.5 kg)  02/27/22 155 lb 4.8 oz (70.4 kg)     GEN: Well nourished, well developed, in no acute distress. HEENT: normal. Neck: Supple, no JVD, carotid bruits, or masses. Cardiac: IRIR, no murmurs, rubs, or gallops. No clubbing, cyanosis, edema.  Radials/PT 2+ and equal bilaterally.  Respiratory:  Respirations regular and unlabored, clear to auscultation bilaterally. GI: Soft, nontender, nondistended. MS: No deformity or atrophy. Skin: Warm and dry, no rash. Neuro:  Strength and sensation are intact. Psych: Normal affect.  Assessment & Plan   PAF / Hypercoagulable state - EKG today recurrent atrial fib. Symptomatic with tachycardia, breathlessness. Continue increased dose Toprol 37.'5mg'$  QD as well as PRN Diltiazem. Continue Multaq '400mg'$  BID. Continue Eliquis '5mg'$  BID - does not meet dose reduction criteria. CHA2DS2-VASc Score = 4 [CHF History: 0, HTN History: 1, Diabetes History: 0, Stroke History: 0, Vascular Disease History: 0, Age Score: 2, Gender Score: 1].  Therefore,  the patient's annual risk of stroke is 4.8 %.    BMP, magnesium, CBC, TSH today. Plan for cardioversion 07/19/22 with Dr. Harrell Gave. No missed doses of Eliquis. Will have her return for EKG prior to cardioversion to ensure still in atrial fibrillation. If still in atrial fibrillation, could increase Toprol to '50mg'$  QD.   HTN - BP well controlled. Continue current antihypertensive regimen.    Hypothyroidism - Continue to follow with PCP.      Disposition: Follow up  2 weeks after cardioversion  with Buford Dresser, MD or APP.  Signed, Loel Dubonnet, NP 06/23/2022, 7:55 PM [facility]

## 2022-06-21 NOTE — H&P (View-Only) (Signed)
Office Visit    Patient Name: Paige Levy Date of Encounter: 06/23/2022  PCP:  Olive Bass   [place]  Cardiologist:  Buford Dresser, MD  Advanced Practice Provider:  No care team member to display Electrophysiologist:  None      Chief Complaint    Paige Levy is a 81 y.o. female presents today for elevated heart rate   Past Medical History    Past Medical History:  Diagnosis Date   Arthritis    Atrial fibrillation ([place])    Hyperlipemia    Hypothyroidism    Meniere's disease    Thyroid disease    Past Surgical History:  Procedure Laterality Date   ABDOMINAL HYSTERECTOMY     CARDIOVERSION N/A 03/21/2021   Procedure: CARDIOVERSION;  Surgeon: Sanda Klein, MD;  Location: [place];  Service: Cardiovascular;  Laterality: N/A;   ERCP Left 10/03/2014   Procedure: ENDOSCOPIC RETROGRADE CHOLANGIOPANCREATOGRAPHY (ERCP);  Surgeon: Missy Sabins, MD;  Location: [place];  Service: Gastroenterology;  Laterality: Left;   ERCP N/A 01/14/2015   Procedure: ENDOSCOPIC RETROGRADE CHOLANGIOPANCREATOGRAPHY (ERCP);  Surgeon: Jeryl Columbia, MD;  Location: [facility] ENDOSCOPY;  Service: Endoscopy;  Laterality: N/A;  need lithotripter/ordered/LH   SPHINCTEROTOMY  09/2014   SPYGLASS LITHOTRIPSY N/A 01/14/2015   Procedure: HERDEYCX LITHOTRIPSY;  Surgeon: Jeryl Columbia, MD;  Location: [facility] ENDOSCOPY;  Service: Endoscopy;  Laterality: N/A;    Allergies  Allergies  Allergen Reactions   Ursodiol Other (See Comments)    Severe Dizziness    History of Present Illness    Paige Levy is a 81 y.o. female with a hx of PAF, hypothyroidism, HLD, HTN last seen 02/27/22 by Dr. Harrell Gave.  Initial evaluation 02/27/21 as consult for atrial fibrillation. Prior cardioversion 03/2021. She was started on Multaq 06/2021.  ED visit 02/15/22 with palpitations with atrial fib with borderline RVR. She was given IV Metoprolol and '30mg'$  of Diltiazem. At follow  up with Dr. Harrell Gave 02/2022 she was in NSR and provided PRN Diltiazem.  Presents today for follow up with her husband after contacting the office noting elevated heart rate requiring PRN noses of her Metoprolol and Diltiazem. No chest pain. A bit breathless when her heart rate is elevated. This is new compared to previous. Did have a lot of stress recently which is when her previous episodes of atrial fibrillation have occurred. Her heart rate has ranged 69-120bpm. Notes BP has been well controlled at home. No missed doses of Eliquis.   EKGs/Labs/Other Studies Reviewed:   The following studies were reviewed today:   EKG:  EKG is ordered today.  The ekg ordered today demonstrates atrial fibrillation 95 bpm.   Recent Labs: 02/15/2022: B Natriuretic Peptide 249.8 03/28/2022: ALT 13 06/21/2022: BUN 15; Creatinine, Ser 1.10; Hemoglobin 14.5; Magnesium 2.2; Platelets 256; Potassium 3.6; Sodium 140; TSH 1.390  Recent Lipid Panel    Component Value Date/Time   CHOL 165 03/28/2022 1027   TRIG 120.0 03/28/2022 1027   HDL 62.40 03/28/2022 1027   CHOLHDL 3 03/28/2022 1027   VLDL 24.0 03/28/2022 1027   LDLCALC 79 03/28/2022 1027   LDLCALC 195 (H) 08/03/2020 1029    Risk Assessment/Calculations:   CHA2DS2-VASc Score = 4   This indicates a 4.8% annual risk of stroke. The patient's score is based upon: CHF History: 0 HTN History: 1 Diabetes History: 0 Stroke History: 0 Vascular Disease History: 0 Age Score: 2 Gender Score: 1     Home  Medications   Current Meds  Medication Sig   diltiazem (CARDIZEM) 30 MG tablet Take 1 tablet (30 mg total) by mouth daily as needed for up to 30 doses. For heart rate above 120 beats per minute (rapid Atrial fibrillation)   dronedarone (MULTAQ) 400 MG tablet Take 1 tablet (400 mg total) by mouth 2 (two) times daily with a meal.   ELIQUIS 5 MG TABS tablet TAKE 1 TABLET BY MOUTH TWICE A DAY   levothyroxine (SYNTHROID) 88 MCG tablet TAKE 1 TABLET BY MOUTH  EVERY DAY BEFORE BREAKFAST   metoprolol succinate (TOPROL-XL) 25 MG 24 hr tablet Take 37.5 mg by mouth in the morning and at bedtime.   Multiple Vitamin (MULTIVITAMIN WITH MINERALS) TABS tablet Take 2 tablets by mouth daily. Gummies   naphazoline-glycerin (CLEAR EYES) 0.012-0.2 % SOLN Place 1-2 drops into both eyes every 4 (four) hours as needed for irritation.   Potassium Gluconate 550 MG TABS Take 550 mg by mouth daily.   rosuvastatin (CRESTOR) 10 MG tablet TAKE 1 TABLET BY MOUTH EVERY DAY   triamterene-hydrochlorothiazide (MAXZIDE-25) 37.5-25 MG tablet TAKE 1 TABLET BY MOUTH EVERY DAY     Review of Systems      All other systems reviewed and are otherwise negative except as noted above.  Physical Exam    VS:  BP 104/74   Pulse 95   Ht '5\' 5"'$  (1.651 m)   Wt 152 lb (68.9 kg)   BMI 25.29 kg/m  , BMI Body mass index is 25.29 kg/m.  Wt Readings from Last 3 Encounters:  06/21/22 152 lb (68.9 kg)  03/28/22 151 lb (68.5 kg)  02/27/22 155 lb 4.8 oz (70.4 kg)     GEN: Well nourished, well developed, in no acute distress. HEENT: normal. Neck: Supple, no JVD, carotid bruits, or masses. Cardiac: IRIR, no murmurs, rubs, or gallops. No clubbing, cyanosis, edema.  Radials/PT 2+ and equal bilaterally.  Respiratory:  Respirations regular and unlabored, clear to auscultation bilaterally. GI: Soft, nontender, nondistended. MS: No deformity or atrophy. Skin: Warm and dry, no rash. Neuro:  Strength and sensation are intact. Psych: Normal affect.  Assessment & Plan   PAF / Hypercoagulable state - EKG today recurrent atrial fib. Symptomatic with tachycardia, breathlessness. Continue increased dose Toprol 37.'5mg'$  QD as well as PRN Diltiazem. Continue Multaq '400mg'$  BID. Continue Eliquis '5mg'$  BID - does not meet dose reduction criteria. CHA2DS2-VASc Score = 4 [CHF History: 0, HTN History: 1, Diabetes History: 0, Stroke History: 0, Vascular Disease History: 0, Age Score: 2, Gender Score: 1].  Therefore,  the patient's annual risk of stroke is 4.8 %.    BMP, magnesium, CBC, TSH today. Plan for cardioversion 07/19/22 with Dr. Harrell Gave. No missed doses of Eliquis. Will have her return for EKG prior to cardioversion to ensure still in atrial fibrillation. If still in atrial fibrillation, could increase Toprol to '50mg'$  QD.   HTN - BP well controlled. Continue current antihypertensive regimen.    Hypothyroidism - Continue to follow with PCP.      Disposition: Follow up  2 weeks after cardioversion  with Buford Dresser, MD or APP.  Signed, Loel Dubonnet, NP 06/23/2022, 7:55 PM [facility]

## 2022-06-22 LAB — BASIC METABOLIC PANEL
BUN/Creatinine Ratio: 14 (ref 12–28)
BUN: 15 mg/dL (ref 8–27)
CO2: 26 mmol/L (ref 20–29)
Calcium: 9 mg/dL (ref 8.7–10.3)
Chloride: 100 mmol/L (ref 96–106)
Creatinine, Ser: 1.1 mg/dL — ABNORMAL HIGH (ref 0.57–1.00)
Glucose: 90 mg/dL (ref 70–99)
Potassium: 3.6 mmol/L (ref 3.5–5.2)
Sodium: 140 mmol/L (ref 134–144)
eGFR: 50 mL/min/{1.73_m2} — ABNORMAL LOW (ref >59)

## 2022-06-22 LAB — CBC
Hematocrit: 41.9 % (ref 34.0–46.6)
Hemoglobin: 14.5 g/dL (ref 11.1–15.9)
MCH: 31.5 pg (ref 26.6–33.0)
MCHC: 34.6 g/dL (ref 31.5–35.7)
MCV: 91 fL (ref 79–97)
Platelets: 256 10*3/uL (ref 150–450)
RBC: 4.61 x10E6/uL (ref 3.77–5.28)
RDW: 12.3 % (ref 11.7–15.4)
WBC: 7.6 10*3/uL (ref 3.4–10.8)

## 2022-06-22 LAB — TSH: TSH: 1.39 u[IU]/mL (ref 0.450–4.500)

## 2022-06-22 LAB — MAGNESIUM: Magnesium: 2.2 mg/dL (ref 1.6–2.3)

## 2022-06-23 ENCOUNTER — Encounter (HOSPITAL_BASED_OUTPATIENT_CLINIC_OR_DEPARTMENT_OTHER): Payer: Self-pay | Admitting: Family

## 2022-06-28 ENCOUNTER — Encounter (HOSPITAL_COMMUNITY): Payer: Self-pay | Admitting: Cardiology

## 2022-07-03 ENCOUNTER — Ambulatory Visit (INDEPENDENT_AMBULATORY_CARE_PROVIDER_SITE_OTHER): Payer: Medicare Other | Admitting: *Deleted

## 2022-07-03 DIAGNOSIS — I480 Paroxysmal atrial fibrillation: Secondary | ICD-10-CM

## 2022-07-04 ENCOUNTER — Other Ambulatory Visit (HOSPITAL_BASED_OUTPATIENT_CLINIC_OR_DEPARTMENT_OTHER): Payer: Self-pay | Admitting: Cardiology

## 2022-07-04 NOTE — Progress Notes (Signed)
   Nurse Visit   Date of Encounter: 07/04/2022 ID: CLISTA RAINFORD, DOB 1941/12/14, MRN [id number]  PCP:  No primary care provider on file.   [facility] Providers Cardiologist:  Buford Dresser, MD      Visit Details   VS:  There were no vitals taken for this visit. , BMI There is no height or weight on file to calculate BMI.  Wt Readings from Last 3 Encounters:  06/21/22 152 lb (68.9 kg)  03/28/22 151 lb (68.5 kg)  02/27/22 155 lb 4.8 oz (70.4 kg)     Reason for visit: EKG PRIOR TO CARDIOVERSION  Performed today: EKG and Education Changes (medications, testing, etc.) : NONE  Length of Visit: 15 minutes  CONTINUE WITH CARDIOVERSION AS PLANNED.  REVIEWED CARDIOVERSION INSTRUCTIONS AGAIN   Medications Adjustments/Labs and Tests Ordered: Orders Placed This Encounter  Procedures   EKG 12-Lead   No orders of the defined types were placed in this encounter.    Perrin Maltese, LPN  5/57/3220 2:54 AM

## 2022-07-04 NOTE — Telephone Encounter (Signed)
Rx(s) sent to pharmacy electronically.  

## 2022-07-04 NOTE — Patient Instructions (Signed)
CONTINUE YOU CURRENT MEDICATIONS AND PROCEED WITH CARDIOVERSION AS SCHEDULED

## 2022-07-05 ENCOUNTER — Encounter (HOSPITAL_COMMUNITY): Payer: Self-pay | Admitting: Cardiology

## 2022-07-05 ENCOUNTER — Other Ambulatory Visit: Payer: Self-pay

## 2022-07-05 ENCOUNTER — Ambulatory Visit (HOSPITAL_COMMUNITY)
Admission: RE | Admit: 2022-07-05 | Discharge: 2022-07-05 | Disposition: A | Discharge status: Home / Self Care | Payer: Medicare Other | Attending: Cardiology | Admitting: Cardiology

## 2022-07-05 ENCOUNTER — Ambulatory Visit (HOSPITAL_BASED_OUTPATIENT_CLINIC_OR_DEPARTMENT_OTHER): Payer: Medicare Other | Admitting: Anesthesiology

## 2022-07-05 ENCOUNTER — Encounter (HOSPITAL_COMMUNITY)
Admission: RE | Disposition: A | Discharge status: Home / Self Care | Payer: Self-pay | Source: Home / Self Care | Attending: Cardiology

## 2022-07-05 ENCOUNTER — Ambulatory Visit (HOSPITAL_COMMUNITY): Payer: Medicare Other | Admitting: Anesthesiology

## 2022-07-05 DIAGNOSIS — E785 Hyperlipidemia, unspecified: Secondary | ICD-10-CM | POA: Diagnosis not present

## 2022-07-05 DIAGNOSIS — Z7901 Long term (current) use of anticoagulants: Secondary | ICD-10-CM | POA: Diagnosis not present

## 2022-07-05 DIAGNOSIS — I480 Paroxysmal atrial fibrillation: Secondary | ICD-10-CM | POA: Insufficient documentation

## 2022-07-05 DIAGNOSIS — I110 Hypertensive heart disease with heart failure: Secondary | ICD-10-CM | POA: Insufficient documentation

## 2022-07-05 DIAGNOSIS — E039 Hypothyroidism, unspecified: Secondary | ICD-10-CM | POA: Insufficient documentation

## 2022-07-05 DIAGNOSIS — I4891 Unspecified atrial fibrillation: Secondary | ICD-10-CM | POA: Diagnosis not present

## 2022-07-05 DIAGNOSIS — M1990 Unspecified osteoarthritis, unspecified site: Secondary | ICD-10-CM | POA: Diagnosis not present

## 2022-07-05 DIAGNOSIS — Z79899 Other long term (current) drug therapy: Secondary | ICD-10-CM | POA: Insufficient documentation

## 2022-07-05 DIAGNOSIS — I10 Essential (primary) hypertension: Secondary | ICD-10-CM | POA: Diagnosis not present

## 2022-07-05 HISTORY — PX: CARDIOVERSION: SHX1299

## 2022-07-05 SURGERY — CARDIOVERSION
Anesthesia: General

## 2022-07-05 MED ORDER — SODIUM CHLORIDE 0.9 % IV SOLN: INTRAVENOUS | Status: DC

## 2022-07-05 MED ORDER — LIDOCAINE HCL (CARDIAC) PF 100 MG/5ML IV SOSY
PREFILLED_SYRINGE | INTRAVENOUS | Status: DC | PRN
  Administered 2022-07-05: 60 mg via INTRAVENOUS

## 2022-07-05 MED ORDER — PROPOFOL 10 MG/ML IV BOLUS
INTRAVENOUS | Status: DC | PRN
  Administered 2022-07-05: 60 mg via INTRAVENOUS

## 2022-07-05 NOTE — Transfer of Care (Signed)
Immediate Anesthesia Transfer of Care Note  Patient: Paige Levy  Procedure(s) Performed: CARDIOVERSION  Patient Location: Endoscopy Unit  Anesthesia Type:MAC  Level of Consciousness: drowsy  Airway & Oxygen Therapy: Patient Spontanous Breathing  Post-op Assessment: Report given to RN and Post -op Vital signs reviewed and stable  Post vital signs: Reviewed and stable  Last Vitals:  Vitals Value Taken Time  BP    Temp    Pulse    Resp    SpO2      Last Pain:  Vitals:   07/05/22 1150  TempSrc: Temporal  PainSc: 0-No pain         Complications: No notable events documented.

## 2022-07-05 NOTE — Anesthesia Preprocedure Evaluation (Signed)
Anesthesia Evaluation  Patient identified by MRN, date of birth, ID band Patient awake    Reviewed: Allergy & Precautions, NPO status , Patient's Chart, lab work & pertinent test results  Airway Mallampati: II  TM Distance: >3 FB Neck ROM: Full    Dental   Pulmonary neg pulmonary ROS,    breath sounds clear to auscultation       Cardiovascular hypertension, Pt. on medications and Pt. on home beta blockers + dysrhythmias Atrial Fibrillation  Rhythm:Irregular Rate:Normal     Neuro/Psych negative neurological ROS     GI/Hepatic negative GI ROS, Neg liver ROS,   Endo/Other  Hypothyroidism   Renal/GU negative Renal ROS     Musculoskeletal  (+) Arthritis ,   Abdominal   Peds  Hematology negative hematology ROS (+)   Anesthesia Other Findings   Reproductive/Obstetrics                             Anesthesia Physical  Anesthesia Plan  ASA: 3  Anesthesia Plan: General   Post-op Pain Management: Minimal or no pain anticipated   Induction: Intravenous  PONV Risk Score and Plan: 3 and Treatment may vary due to age or medical condition and Propofol infusion  Airway Management Planned: Natural Airway and Simple Face Mask  Additional Equipment: None  Intra-op Plan:   Post-operative Plan:   Informed Consent: I have reviewed the patients History and Physical, chart, labs and discussed the procedure including the risks, benefits and alternatives for the proposed anesthesia with the patient or authorized representative who has indicated his/her understanding and acceptance.       Plan Discussed with: CRNA and Anesthesiologist  Anesthesia Plan Comments:         Anesthesia Quick Evaluation

## 2022-07-05 NOTE — Discharge Instructions (Signed)

## 2022-07-05 NOTE — Interval H&P Note (Signed)
History and Physical Interval Note:  07/05/2022 11:53 AM  Paige Levy  has presented today for surgery, with the diagnosis of AFIB.  The various methods of treatment have been discussed with the patient and family. After consideration of risks, benefits and other options for treatment, the patient has consented to  Procedure(s): CARDIOVERSION (N/A) as a surgical intervention.  The patient's history has been reviewed, patient examined, no change in status, stable for surgery.  I have reviewed the patient's chart and labs.  Questions were answered to the patient's satisfaction.     Solomiya Pascale Harrell Gave

## 2022-07-05 NOTE — Anesthesia Postprocedure Evaluation (Signed)
Anesthesia Post Note  Patient: Paige Levy  Procedure(s) Performed: CARDIOVERSION     Patient location during evaluation: PACU Anesthesia Type: General Level of consciousness: awake and alert Pain management: pain level controlled Vital Signs Assessment: post-procedure vital signs reviewed and stable Respiratory status: spontaneous breathing, nonlabored ventilation, respiratory function stable and patient connected to nasal cannula oxygen Cardiovascular status: blood pressure returned to baseline and stable Postop Assessment: no apparent nausea or vomiting Anesthetic complications: no   No notable events documented.  Last Vitals:  Vitals:   07/05/22 1220 07/05/22 1240  BP: (!) 103/51 (!) 104/55  Pulse: (!) 56 (!) 58  Resp: 19 20  Temp:    SpO2: 97% 98%    Last Pain:  Vitals:   07/05/22 1240  TempSrc:   PainSc: 0-No pain                 Isebella Upshur

## 2022-07-05 NOTE — CV Procedure (Signed)
Procedure:   DCCV  Indication:  Symptomatic atrial fibrillation  Procedure Note:  The patient signed informed consent.  They have had had therapeutic anticoagulation with apixaban greater than 3 weeks.  Anesthesia was supervised by Dr. Ambrose Pancoast.  Patient received 60 mg IV lidocaine and 60 mg IV propofol. Adequate airway was maintained throughout and vital followed per protocol.  They were cardioverted x 3 with 120, 150, 200J of biphasic synchronized energy with anterior pressure.  They converted to NSR but only held for 2 beats at 120 and 150 J, then returned to afib. She remained in sustained sinus after 200 J.  There were no apparent complications.  The patient had normal neuro status and respiratory status post procedure with vitals stable as recorded elsewhere.    Follow up:  They will continue on current medical therapy and follow up with cardiology as scheduled.  Buford Dresser, MD PhD 07/05/2022 12:06 PM

## 2022-07-10 ENCOUNTER — Telehealth: Payer: Self-pay | Admitting: Cardiology

## 2022-07-10 NOTE — Telephone Encounter (Signed)
Sorry to hear she returned to atrial fib. Ensure taking Multaq as prescribed. Increase Toprol to 37.'5mg'$  BID. May use PRN Diltiazem for HR persistently >120bpm.   Please call us Friday to update on BP/HR and we can determine if additional medication changes needed.  Follow up as scheduled.   Loel Dubonnet, NP

## 2022-07-10 NOTE — Telephone Encounter (Signed)
Advised husband, verbalized understanding  Did confirm taking Multaq as prescribed

## 2022-07-10 NOTE — Telephone Encounter (Signed)
STAT if HR is under 50 or over 120 (normal HR is 60-100 beats per minute)  What is your heart rate? 110  Do you have a log of your heart rate readings (document readings)? 110-130  Do you have any other symptoms? Spouse states that pt's HR has been fairly high since having Cardio Version last week. Spouse states that pt does not seem to be doing any better. Please advise

## 2022-07-10 NOTE — Telephone Encounter (Signed)
Spoke with husband who accompanied her to pre cardioversion EKG  Day of cardioversion HR 68 before bed, next am woke up with HR in 100's back in Afib per [employer] Patient has no energy, out of breath feeling, and not doing anything HR 110's at rest and then up moving around goes up to 120's-130's  When uses the Metoprolol tart doesn't really bring HR down, but does not go up  Uses 1 to 2 times a day  Taking Toprol 25 mg twice a day, not 1 and 1/2 twice a day  SBP running more 100's   Scheduled for follow up with Dr Harrell Gave next week Does not want to be seen at [facility] clinic, did not have best experience there   Will forward to [place] Mam NP for review

## 2022-07-13 ENCOUNTER — Telehealth: Payer: Self-pay | Admitting: Cardiology

## 2022-07-13 MED ORDER — AMIODARONE HCL 200 MG PO TABS: ORAL_TABLET | ORAL | 3 refills | Status: DC

## 2022-07-13 NOTE — Telephone Encounter (Signed)
Discussed with Dr. Harrell Gave.  Recommended discontinue Multaq.  Start amiodarone 400 mg BID x 1 week ?  '200mg'$  BID x 1 week ?  then '200mg'$  daily. Continue Toprol 37.'5mg'$  BID, Diltiazem PRN. Follow up as scheduled.  Loel Dubonnet, NP

## 2022-07-13 NOTE — Telephone Encounter (Signed)
Heart rate as requested

## 2022-07-13 NOTE — Telephone Encounter (Signed)
HR Range: 112-119  BP Range: 103-105/64-71  Information was requested by Laurann Montana

## 2022-07-13 NOTE — Telephone Encounter (Signed)
RN returned call and provided the following information, patient verbalizes understanding.     "Discussed with Dr. Harrell Gave.  Recommended discontinue Multaq.  Start amiodarone 400 mg BID x 1 week ?  '200mg'$  BID x 1 week ?  then '200mg'$  daily. Continue Toprol 37.'5mg'$  BID, Diltiazem PRN. Follow up as scheduled.   Loel Dubonnet, NP "

## 2022-07-19 ENCOUNTER — Ambulatory Visit (HOSPITAL_BASED_OUTPATIENT_CLINIC_OR_DEPARTMENT_OTHER): Payer: Medicare Other | Admitting: Cardiology

## 2022-07-19 ENCOUNTER — Encounter (HOSPITAL_BASED_OUTPATIENT_CLINIC_OR_DEPARTMENT_OTHER): Payer: Self-pay | Admitting: Cardiology

## 2022-07-19 VITALS — BP 128/70 | HR 53 | Ht 65.0 in | Wt 153.2 lb

## 2022-07-19 DIAGNOSIS — I480 Paroxysmal atrial fibrillation: Secondary | ICD-10-CM | POA: Diagnosis not present

## 2022-07-19 DIAGNOSIS — D6859 Other primary thrombophilia: Secondary | ICD-10-CM

## 2022-07-19 DIAGNOSIS — Z7901 Long term (current) use of anticoagulants: Secondary | ICD-10-CM

## 2022-07-19 DIAGNOSIS — I10 Essential (primary) hypertension: Secondary | ICD-10-CM

## 2022-07-19 MED ORDER — AMIODARONE HCL 200 MG PO TABS: 200.0000 mg | ORAL_TABLET | Freq: Every day | ORAL | 3 refills | Status: DC

## 2022-07-19 NOTE — Patient Instructions (Addendum)
Medication Instructions:  Keep taking the amiodarone as written. I will update your prescription today (but finish out the current script first).   We are going to cut back on the metoprolol to 25 mg twice a day (from 37.5 mg twice a day).   *If you need a refill on your cardiac medications before your next appointment, please call your pharmacy*   Lab Work: None ordered today   Testing/Procedures: None ordered today   Follow-Up: At [facility], you and your health needs are our priority.  As part of our continuing mission to provide you with exceptional heart care, we have created designated Provider Care Teams.  These Care Teams include your primary Cardiologist (physician) and Advanced Practice Providers (APPs -  Physician Assistants and Nurse Practitioners) who all work together to provide you with the care you need, when you need it.  We recommend signing up for the patient portal called "MyChart".  Sign up information is provided on this After Visit Summary.  MyChart is used to connect with patients for Virtual Visits (Telemedicine).  Patients are able to view lab/test results, encounter notes, upcoming appointments, etc.  Non-urgent messages can be sent to your provider as well.   To learn more about what you can do with MyChart, go to [email or web address].    Your next appointment:   6 month(s)  The format for your next appointment:   In Person  Provider:   Buford Dresser, MD{

## 2022-07-19 NOTE — Progress Notes (Signed)
Cardiology Office Note:    Date:  07/19/2022   ID:  Paige Levy, DOB [birth year], MRN [id number]  PCP:  Merrilee Seashore, Levy  Cardiologist:  Buford Dresser, [place]  CC: follow up  History of Present Illness:    Paige Levy is a 81 y.o. female with a hx of paroxysmal atrial fibrillation, hypothyroidism, hyperlipidemia, hypertension who is seen for follow up today. I initially met her 02/27/21 as a new consult for the evaluation and management of atrial fibrillation.  Today: S/P cardioversion 07/05/22; return of afib shortly thereafter. A week ago we stopped Multaq and started amiodarone. She is still on the 400 mg BID dose until tomorrow AM. She brings a log of home HR and blood pressure. Heart rate had been elevated >100 bpm as recently as this AM. On ECG today, she is in sinus brady at 53 bpm. She can feel a difference. Doing very well on the amiodarone, no issues. Discussed monitoring.  Denies chest pain, shortness of breath at rest or with normal exertion. No PND, orthopnea, LE edema or unexpected weight gain. No syncope.  Past Medical History:  Diagnosis Date   Arthritis    Atrial fibrillation ([place])    Hyperlipemia    Hypothyroidism    Meniere's disease    Thyroid disease     Past Surgical History:  Procedure Laterality Date   ABDOMINAL HYSTERECTOMY     CARDIOVERSION N/A 03/21/2021   Procedure: CARDIOVERSION;  Surgeon: Paige Levy;  Location: [place];  Service: Cardiovascular;  Laterality: N/A;   CARDIOVERSION N/A 07/05/2022   Procedure: CARDIOVERSION;  Surgeon: Buford Dresser, [place];  Location: [facility] ENDOSCOPY;  Service: Cardiovascular;  Laterality: N/A;   ERCP Left 10/03/2014   Procedure: ENDOSCOPIC RETROGRADE CHOLANGIOPANCREATOGRAPHY (ERCP);  Surgeon: Paige Levy;  Location: [place];  Service: Gastroenterology;  Laterality: Left;   ERCP N/A 01/14/2015   Procedure: ENDOSCOPIC RETROGRADE CHOLANGIOPANCREATOGRAPHY (ERCP);  Surgeon: Paige Levy;  Location: [facility] ENDOSCOPY;  Service: Endoscopy;  Laterality: N/A;  need lithotripter/ordered/LH   SPHINCTEROTOMY  09/2014   SPYGLASS LITHOTRIPSY N/A 01/14/2015   Procedure: RSWNIOEV LITHOTRIPSY;  Surgeon: Paige Levy;  Location: [facility] ENDOSCOPY;  Service: Endoscopy;  Laterality: N/A;    Current Medications: Current Outpatient Medications on File Prior to Visit  Medication Sig   diltiazem (CARDIZEM) 30 MG tablet TAKE 1 TABLET DAILY AS NEEDED FOR HEART RATE ABOVE 120 BEATS PER MINUTE (RAPID ATRIAL FIBRILLATION)   ELIQUIS 5 MG TABS tablet TAKE 1 TABLET BY MOUTH TWICE A DAY   levothyroxine (SYNTHROID) 88 MCG tablet TAKE 1 TABLET BY MOUTH EVERY DAY BEFORE BREAKFAST   metoprolol succinate (TOPROL-XL) 25 MG 24 hr tablet Take 25 mg by mouth in the morning and at bedtime.   Multiple Vitamin (MULTIVITAMIN WITH MINERALS) TABS tablet Take 2 tablets by mouth daily. Gummies   olopatadine (PATADAY) 0.1 % ophthalmic solution Place 1 drop into both eyes daily as needed for allergies.   Polyethyl Glycol-Propyl Glycol (SYSTANE OP) Place 1 drop into both eyes 2 (two) times daily as needed (dry eyes).   Potassium Gluconate 550 MG TABS Take 550 mg by mouth daily.   rosuvastatin (CRESTOR) 10 MG tablet TAKE 1 TABLET BY MOUTH EVERY DAY   triamterene-hydrochlorothiazide (MAXZIDE-25) 37.5-25 MG tablet TAKE 1 TABLET BY MOUTH EVERY DAY   No current facility-administered medications on file prior to visit.     Allergies:   Ursodiol   Social History   Tobacco Use   Smoking status:  Never   Smokeless tobacco: Never  Substance Use Topics   Alcohol use: No   Drug use: No    Family History: family history includes Heart attack in her mother; Other in her father and mother.  ROS:   Please see the history of present illness.  Additional pertinent ROS otherwise unremarkable.  EKGs/Labs/Other Studies Reviewed:    The following studies were reviewed today: CTA Chest 02/15/22 IMPRESSION: No evidence of  pulmonary embolism.   Tiny hiatal hernia.   Aortic Atherosclerosis (ICD10-I70.0).  Echo 03/24/21  1. Left ventricular ejection fraction, by estimation, is 55 to 60%. The  left ventricle has normal function. The left ventricle has no regional  wall motion abnormalities. Left ventricular diastolic parameters are  indeterminate.   2. Right ventricular systolic function is normal. The right ventricular  size is normal.   3. The mitral valve is normal in structure. Mild mitral valve  regurgitation. No evidence of mitral stenosis.   4. The aortic valve is tricuspid. Aortic valve regurgitation is not  visualized. Mild to moderate aortic valve sclerosis/calcification is  present, without any evidence of aortic stenosis.   5. The inferior vena cava is normal in size with greater than 50%  respiratory variability, suggesting right atrial pressure of 3 mmHg.  EKG:  EKG is personally reviewed. 07/19/22 sinus bradycardia at 53 bpm, with 1st degree AV block 02/27/22: Sinus bradycardia, rate 55 bpm 08/30/21 SR with PACs, 65 bpm 03/29/21 sinus rhythm with PACs at 63 bpm. 02/27/21: atrial fibrillation with RVR at 106 bpm  Recent Labs: 02/15/2022: B Natriuretic Peptide 249.8 03/28/2022: ALT 13 06/21/2022: BUN 15; Creatinine, Ser 1.10; Hemoglobin 14.5; Magnesium 2.2; Platelets 256; Potassium 3.6; Sodium 140; TSH 1.390  Recent Lipid Panel    Component Value Date/Time   CHOL 165 03/28/2022 1027   TRIG 120.0 03/28/2022 1027   HDL 62.40 03/28/2022 1027   CHOLHDL 3 03/28/2022 1027   VLDL 24.0 03/28/2022 1027   LDLCALC 79 03/28/2022 1027   LDLCALC 195 (H) 08/03/2020 1029    Physical Exam:    VS:  BP 128/70 (BP Location: Left Arm, Patient Position: Sitting, Cuff Size: Normal)   Pulse (!) 53   Ht 5' 5"  (1.651 m)   Wt 153 lb 3.2 oz (69.5 kg)   BMI 25.49 kg/m     Wt Readings from Last 3 Encounters:  07/19/22 153 lb 3.2 oz (69.5 kg)  07/05/22 152 lb (68.9 kg)  06/21/22 152 lb (68.9 kg)    GEN: Well  nourished, well developed in no acute distress HEENT: Normal, moist mucous membranes NECK: No JVD CARDIAC: regular rhythm, normal S1 and S2, no rubs or gallops. No murmur. VASCULAR: Radial and DP pulses 2+ bilaterally. No carotid bruits RESPIRATORY:  Clear to auscultation without rales, wheezing or rhonchi  ABDOMEN: Soft, non-tender, non-distended MUSCULOSKELETAL:  Ambulates independently SKIN: Warm and dry, no edema NEUROLOGIC:  Alert and oriented x 3. No focal neuro deficits noted. PSYCHIATRIC:  Normal affect    ASSESSMENT:    1. Paroxysmal atrial fibrillation (HCC)   2. Hypercoagulable state ([place])   3. Essential hypertension   4. Current use of long term anticoagulation      PLAN:    atrial fibrillation, paroxysmal: -CHA2DS2/VAS Stroke Risk Points=3 -continue apixaban 5 mg BID (only meets age criteria for reduction, not weight/Cr) -echo unremarkable -had cardioversion 7/13 with return to afib several days later -changed multaq to amiodarone 7/21. Doing very well with this. Now in sinus brady -decrease metoprolol to  25 mg BID. Continue amiodarone, reviewed ramp dosing  Hypertension:  -at goal today -has history of hypokalemia. May need to change from triamterene-HCTZ if electrolytes are abnormal  Hypercholesterolemia: -continue rosuvastatin 10 mg daily  Cardiac risk counseling and prevention recommendations: -recommend heart healthy/Mediterranean diet, with whole grains, fruits, vegetable, fish, lean meats, nuts, and olive oil. Limit salt. -recommend moderate walking, 3-5 times/week for 30-50 minutes each session. Aim for at least 150 minutes.week. Goal should be pace of 3 miles/hours, or walking 1.5 miles in 30 minutes -recommend avoidance of tobacco products. Avoid excess alcohol.  Plan for follow up: 6 months or sooner as needed  Buford Dresser, MD, PhD, [facility]    Medication Adjustments/Labs and Tests Ordered: Current medicines  are reviewed at length with the patient today.  Concerns regarding medicines are outlined above.  Orders Placed This Encounter  Procedures   EKG 12-Lead    Meds ordered this encounter  Medications   amiodarone (PACERONE) 200 MG tablet    Sig: Take 1 tablet (200 mg total) by mouth daily.    Dispense:  90 tablet    Refill:  3   Patient Instructions  Medication Instructions:  Keep taking the amiodarone as written. I will update your prescription today (but finish out the current script first).   We are going to cut back on the metoprolol to 25 mg twice a day (from 37.5 mg twice a day).   *If you need a refill on your cardiac medications before your next appointment, please call your pharmacy*   Lab Work: None ordered today   Testing/Procedures: None ordered today   Follow-Up: At [facility], you and your health needs are our priority.  As part of our continuing mission to provide you with exceptional heart care, we have created designated Provider Care Teams.  These Care Teams include your primary Cardiologist (physician) and Advanced Practice Providers (APPs -  Physician Assistants and Nurse Practitioners) who all work together to provide you with the care you need, when you need it.  We recommend signing up for the patient portal called "MyChart".  Sign up information is provided on this After Visit Summary.  MyChart is used to connect with patients for Virtual Visits (Telemedicine).  Patients are able to view lab/test results, encounter notes, upcoming appointments, etc.  Non-urgent messages can be sent to your provider as well.   To learn more about what you can do with MyChart, go to [email or web address].    Your next appointment:   6 month(s)  The format for your next appointment:   In Person  Provider:   Buford Dresser, [place]{     Signed, Buford Dresser, MD PhD 07/19/2022    [place]

## 2022-07-23 ENCOUNTER — Telehealth (HOSPITAL_BASED_OUTPATIENT_CLINIC_OR_DEPARTMENT_OTHER): Payer: Self-pay | Admitting: Cardiology

## 2022-07-23 NOTE — Telephone Encounter (Signed)
RN called the patient and her husband, provided the following recommendations and patient is scheduled for Thursday 8/3 at 1:55pm with Laurann Montana, NP    "Anticipate she converted to normal sinus rhythm which is why her heart rate is low. Would stop Metoprolol. Okay to continue Amiodarone.    Can we have her come in this week either for office visit or nurse visit for EKG? Whichever fits better in her schedule.   Loel Dubonnet, NP "

## 2022-07-23 NOTE — Telephone Encounter (Signed)
Pt c/o medication issue:  1. Name of Medication: Amiodarone  2. How are you currently taking this medication (dosage and times per day)? 1 pill 2 times a day  3. Are you having a reaction (difficulty breathing--STAT)?   4. What is your medication issue? Heart rate is too late at this time- been as low as 43- it is 47 this morning

## 2022-07-23 NOTE — Telephone Encounter (Signed)
Anticipate she converted to normal sinus rhythm which is why her heart rate is low. Would stop Metoprolol. Okay to continue Amiodarone.   Can we have her come in this week either for office visit or nurse visit for EKG? Whichever fits better in her schedule.  Loel Dubonnet, NP

## 2022-07-23 NOTE — Telephone Encounter (Signed)
RN transferred call, patient's husband called to report low heart rate, in the 40's, as low 43 and 47 this morning. Has been taking her Amiodarone but has not had it this morning. Patient reports being tired and with low energy. Husband states patient had one episode of lightheadedness over the weekend.    BP 100-110/60-70s  Routing to C. Gilford Rile, NP for advisement.

## 2022-07-26 ENCOUNTER — Ambulatory Visit (HOSPITAL_BASED_OUTPATIENT_CLINIC_OR_DEPARTMENT_OTHER): Payer: Medicare Other | Admitting: Family

## 2022-07-26 ENCOUNTER — Telehealth: Payer: Self-pay | Admitting: Cardiology

## 2022-07-26 NOTE — Telephone Encounter (Signed)
Called and spoke with husband per DPR.   HR was 47-57 bpm and then yesterday afternoon it went up as high as 129bpm. Took BP this morning 89/63 with HR 109bpm. Repeat heart rate 118bpm and BP 109/72. Feels fatigued but no near syncope, syncope.  Instructed to take Metoprolol Succinate '25mg'$  now. Recheck heart rate in one hour and if HR >100bpm take Diltiazem '30mg'$ .   Will call back around lunchtime to check on her.   She requests to cancel appt for today due to fatigue but agreeable to OV tomorrow at 1:15PM. Will adjust appointment.   Loel Dubonnet, NP

## 2022-07-26 NOTE — Telephone Encounter (Signed)
Called to check in.She took Metoprolol and then Diltiazem one hour later. BP 105-109/69-73. Heart rate [place] 115. She is feeling better.  Instructed if HR >120 bpm take additional dose Diltiazem '30mg'$ . Take Amiodarone as scheduled in the morning.  If morning HR > 100 bpm, take metoprolol succinate '25mg'$   F/u as scheduled tomorrow afternoon.  Loel Dubonnet, NP

## 2022-07-26 NOTE — Telephone Encounter (Signed)
Patient c/o Palpitations:  High priority if patient c/o lightheadedness, shortness of breath, or chest pain  How long have you had palpitations/irregular HR/ Afib? Are you having the symptoms now? Afib started yesterday afternoon. Husband states that it flipped from low to high last night. Whereas pt's husband states it is usually from high to low.   Are you currently experiencing lightheadedness, SOB or CP? No  Do you have a history of afib (atrial fibrillation) or irregular heart rhythm? Yes  Have you checked your BP or HR? (document readings if available): 89/63  Are you experiencing any other symptoms? Headache last night, but gone now.   Pt's husband is unsure if they should keep the appt for today due to the patient not feeling well after last night's afib.

## 2022-07-27 ENCOUNTER — Ambulatory Visit (HOSPITAL_BASED_OUTPATIENT_CLINIC_OR_DEPARTMENT_OTHER): Payer: Medicare Other | Admitting: Family

## 2022-07-27 VITALS — BP 128/76 | HR 61 | Ht 64.0 in | Wt 154.2 lb

## 2022-07-27 DIAGNOSIS — I480 Paroxysmal atrial fibrillation: Secondary | ICD-10-CM | POA: Diagnosis not present

## 2022-07-27 DIAGNOSIS — E039 Hypothyroidism, unspecified: Secondary | ICD-10-CM | POA: Diagnosis not present

## 2022-07-27 DIAGNOSIS — I10 Essential (primary) hypertension: Secondary | ICD-10-CM | POA: Diagnosis not present

## 2022-07-27 DIAGNOSIS — D6859 Other primary thrombophilia: Secondary | ICD-10-CM | POA: Diagnosis not present

## 2022-07-27 MED ORDER — METOPROLOL SUCCINATE ER 25 MG PO TB24: 25.0000 mg | ORAL_TABLET | Freq: Every day | ORAL | 1 refills | Status: DC

## 2022-07-27 NOTE — Patient Instructions (Signed)
Medication Instructions:   Resume Metoprolol Succinate 25 mg daily.  --For resting heart rate greater than 110, take Diltiazem. If heart rate has not come down after 30 minutes, take Metoprolol.   *If you need a refill on your cardiac medications before your next appointment, please call your pharmacy*   Follow-Up: At [facility], you and your health needs are our priority.  As part of our continuing mission to provide you with exceptional heart care, we have created designated Provider Care Teams.  These Care Teams include your primary Cardiologist (physician) and Advanced Practice Providers (APPs -  Physician Assistants and Nurse Practitioners) who all work together to provide you with the care you need, when you need it.  We recommend signing up for the patient portal called "MyChart".  Sign up information is provided on this After Visit Summary.  MyChart is used to connect with patients for Virtual Visits (Telemedicine).  Patients are able to view lab/test results, encounter notes, upcoming appointments, etc.  Non-urgent messages can be sent to your provider as well.   To learn more about what you can do with MyChart, go to [email or web address].    Your next appointment:   January 2024  The format for your next appointment:   In Person  Provider:   Buford Dresser, MD    Other Instructions   Important Information About Sugar

## 2022-07-27 NOTE — Progress Notes (Unsigned)
Office Visit    Patient Name: Paige Levy Date of Encounter: 07/27/2022  PCP:  Merrilee Seashore, [place]  Cardiologist:  Buford Dresser, MD  Advanced Practice Provider:  No care team member to display Electrophysiologist:  None      Chief Complaint    Paige Levy is a 81 y.o. female presents today for elevated heart rate   Past Medical History    Past Medical History:  Diagnosis Date   Arthritis    Atrial fibrillation ([place])    Hyperlipemia    Hypothyroidism    Meniere's disease    Thyroid disease    Past Surgical History:  Procedure Laterality Date   ABDOMINAL HYSTERECTOMY     CARDIOVERSION N/A 03/21/2021   Procedure: CARDIOVERSION;  Surgeon: Sanda Klein, MD;  Location: [place];  Service: Cardiovascular;  Laterality: N/A;   CARDIOVERSION N/A 07/05/2022   Procedure: CARDIOVERSION;  Surgeon: Buford Dresser, MD;  Location: [facility] ENDOSCOPY;  Service: Cardiovascular;  Laterality: N/A;   ERCP Left 10/03/2014   Procedure: ENDOSCOPIC RETROGRADE CHOLANGIOPANCREATOGRAPHY (ERCP);  Surgeon: Missy Sabins, MD;  Location: [place];  Service: Gastroenterology;  Laterality: Left;   ERCP N/A 01/14/2015   Procedure: ENDOSCOPIC RETROGRADE CHOLANGIOPANCREATOGRAPHY (ERCP);  Surgeon: Jeryl Columbia, MD;  Location: [facility] ENDOSCOPY;  Service: Endoscopy;  Laterality: N/A;  need lithotripter/ordered/LH   SPHINCTEROTOMY  09/2014   SPYGLASS LITHOTRIPSY N/A 01/14/2015   Procedure: [id number] LITHOTRIPSY;  Surgeon: Jeryl Columbia, MD;  Location: [facility] ENDOSCOPY;  Service: Endoscopy;  Laterality: N/A;    Allergies  Allergies  Allergen Reactions   Ursodiol Other (See Comments)    Severe Dizziness    History of Present Illness    Paige Levy is a 81 y.o. female with a hx of PAF, hypothyroidism, HLD, HTN last seen 02/27/22 by Dr. Harrell Gave.  Initial evaluation 02/27/21 as consult for atrial fibrillation. Prior cardioversion 03/2021.  She was started on Multaq 06/2021.  ED visit 02/15/22 with palpitations with atrial fib with borderline RVR. She was given IV Metoprolol and '30mg'$  of Diltiazem. At follow up with Dr. Harrell Gave 02/2022 she was in NSR and provided PRN Diltiazem.  Presents today for follow up with her husband after contacting the office noting elevated heart rate requiring PRN noses of her Metoprolol and Diltiazem. No chest pain. A bit breathless when her heart rate is elevated. This is new compared to previous. Did have a lot of stress recently which is when her previous episodes of atrial fibrillation have occurred. Her heart rate has ranged 69-120bpm. Notes BP has been well controlled at home. No missed doses of Eliquis.   *** Walked the Nurse, adult Studies Reviewed:   The following studies were reviewed today:   EKG:  EKG is ordered today.  The ekg ordered today demonstrates NSR 61 bpm.   Recent Labs: 02/15/2022: B Natriuretic Peptide 249.8 03/28/2022: ALT 13 06/21/2022: BUN 15; Creatinine, Ser 1.10; Hemoglobin 14.5; Magnesium 2.2; Platelets 256; Potassium 3.6; Sodium 140; TSH 1.390  Recent Lipid Panel    Component Value Date/Time   CHOL 165 03/28/2022 1027   TRIG 120.0 03/28/2022 1027   HDL 62.40 03/28/2022 1027   CHOLHDL 3 03/28/2022 1027   VLDL 24.0 03/28/2022 1027   LDLCALC 79 03/28/2022 1027   LDLCALC 195 (H) 08/03/2020 1029    Risk Assessment/Calculations:   CHA2DS2-VASc Score = 4   This indicates a 4.8% annual risk of stroke. The patient's score is based  upon: CHF History: 0 HTN History: 1 Diabetes History: 0 Stroke History: 0 Vascular Disease History: 0 Age Score: 2 Gender Score: 1     Home Medications   Current Meds  Medication Sig   amiodarone (PACERONE) 200 MG tablet Take 1 tablet (200 mg total) by mouth daily.   diltiazem (CARDIZEM) 30 MG tablet TAKE 1 TABLET DAILY AS NEEDED FOR HEART RATE ABOVE 120 BEATS PER MINUTE (RAPID ATRIAL FIBRILLATION)   ELIQUIS 5 MG TABS  tablet TAKE 1 TABLET BY MOUTH TWICE A DAY   levothyroxine (SYNTHROID) 88 MCG tablet TAKE 1 TABLET BY MOUTH EVERY DAY BEFORE BREAKFAST   Multiple Vitamin (MULTIVITAMIN WITH MINERALS) TABS tablet Take 2 tablets by mouth daily. Gummies   olopatadine (PATADAY) 0.1 % ophthalmic solution Place 1 drop into both eyes daily as needed for allergies.   Polyethyl Glycol-Propyl Glycol (SYSTANE OP) Place 1 drop into both eyes 2 (two) times daily as needed (dry eyes).   Potassium Gluconate 550 MG TABS Take 550 mg by mouth daily.   rosuvastatin (CRESTOR) 10 MG tablet TAKE 1 TABLET BY MOUTH EVERY DAY   triamterene-hydrochlorothiazide (MAXZIDE-25) 37.5-25 MG tablet TAKE 1 TABLET BY MOUTH EVERY DAY     Review of Systems      All other systems reviewed and are otherwise negative except as noted above.  Physical Exam    VS:  BP 128/76 (BP Location: Left Arm, Patient Position: Sitting, Cuff Size: Normal)   Pulse 61   Ht '5\' 4"'$  (1.626 m)   Wt 154 lb 3.2 oz (69.9 kg)   BMI 26.47 kg/m  , BMI Body mass index is 26.47 kg/m.  Wt Readings from Last 3 Encounters:  07/27/22 154 lb 3.2 oz (69.9 kg)  07/19/22 153 lb 3.2 oz (69.5 kg)  07/05/22 152 lb (68.9 kg)     GEN: Well nourished, well developed, in no acute distress. HEENT: normal. Neck: Supple, no JVD, carotid bruits, or masses. Cardiac: IRIR, no murmurs, rubs, or gallops. No clubbing, cyanosis, edema.  Radials/PT 2+ and equal bilaterally.  Respiratory:  Respirations regular and unlabored, clear to auscultation bilaterally. GI: Soft, nontender, nondistended. MS: No deformity or atrophy. Skin: Warm and dry, no rash. Neuro:  Strength and sensation are intact. Psych: Normal affect.  Assessment & Plan   PAF / Hypercoagulable state - EKG today recurrent atrial fib. Symptomatic with tachycardia, breathlessness. Continue increased dose Toprol 37.'5mg'$  QD as well as PRN Diltiazem. Continue Multaq '400mg'$  BID. Continue Eliquis '5mg'$  BID - does not meet dose reduction  criteria. CHA2DS2-VASc Score = 4 [CHF History: 0, HTN History: 1, Diabetes History: 0, Stroke History: 0, Vascular Disease History: 0, Age Score: 2, Gender Score: 1].  Therefore, the patient's annual risk of stroke is 4.8 %.    BMP, magnesium, CBC, TSH today. Plan for cardioversion 07/19/22 with Dr. Harrell Gave. No missed doses of Eliquis. Will have her return for EKG prior to cardioversion to ensure still in atrial fibrillation. If still in atrial fibrillation, could increase Toprol to '50mg'$  QD.   HTN - BP well controlled. Continue current antihypertensive regimen.    Hypothyroidism - Continue to follow with PCP.      Disposition: Follow up  2 weeks after cardioversion  with Buford Dresser, MD or APP.  Signed, Loel Dubonnet, NP 07/27/2022, 1:13 PM [facility]

## 2022-07-28 ENCOUNTER — Encounter (HOSPITAL_BASED_OUTPATIENT_CLINIC_OR_DEPARTMENT_OTHER): Payer: Self-pay | Admitting: Family

## 2022-07-28 ENCOUNTER — Other Ambulatory Visit (HOSPITAL_BASED_OUTPATIENT_CLINIC_OR_DEPARTMENT_OTHER): Payer: Self-pay | Admitting: Cardiovascular Disease

## 2022-07-30 NOTE — Telephone Encounter (Signed)
Rx(s) sent to pharmacy electronically.  

## 2022-08-09 ENCOUNTER — Other Ambulatory Visit: Payer: Self-pay | Admitting: Family Medicine

## 2022-08-13 ENCOUNTER — Other Ambulatory Visit: Payer: Self-pay | Admitting: General Practice

## 2022-08-13 DIAGNOSIS — I4891 Unspecified atrial fibrillation: Secondary | ICD-10-CM

## 2022-08-13 NOTE — Telephone Encounter (Signed)
Eliquis '5mg'$  refill request received. Patient is 81 years old, weight-69.9kg, Crea-1.10 on 06/21/2022, Diagnosis-Afib, and last seen by Laurann Montana on 07/27/2022. Dose is appropriate based on dosing criteria. Will send in refill to requested pharmacy.

## 2022-08-24 ENCOUNTER — Ambulatory Visit: Payer: Medicare Other | Admitting: Physician Assistant

## 2022-08-28 ENCOUNTER — Encounter: Payer: Self-pay | Admitting: Physician Assistant

## 2022-08-28 ENCOUNTER — Ambulatory Visit (INDEPENDENT_AMBULATORY_CARE_PROVIDER_SITE_OTHER): Payer: Medicare Other | Admitting: Physician Assistant

## 2022-08-28 VITALS — BP 138/60 | HR 58 | Temp 98.3°F | Ht 64.0 in | Wt 153.6 lb

## 2022-08-28 DIAGNOSIS — I4891 Unspecified atrial fibrillation: Secondary | ICD-10-CM | POA: Diagnosis not present

## 2022-08-28 DIAGNOSIS — D485 Neoplasm of uncertain behavior of skin: Secondary | ICD-10-CM | POA: Insufficient documentation

## 2022-08-28 NOTE — Progress Notes (Signed)
Subjective:    Patient ID: Paige Levy, female    DOB: 08-18-1941, 81 y.o.   MRN: [id number]  Chief Complaint  Patient presents with   Follow-up    Pt coming in for 6 month wellness check; pt hit her right hand a year ago and now it has a place that has came up that is hard and causes some discomfort; no other issues to discuss;     HPI Patient is in today for 6 month follow-up visit. She has had regular f/up with her cardiologist since her last visit and says medication seems to be right. Her heart is feeling good. Blood work overall good in June with them.   States she has a sore lesion that came back on her R hand in the last few months. More raised, some discomfort. Needs this looked at.   Past Medical History:  Diagnosis Date   Arthritis    Atrial fibrillation ([place])    Hyperlipemia    Hypothyroidism    Meniere's disease    Thyroid disease     Past Surgical History:  Procedure Laterality Date   ABDOMINAL HYSTERECTOMY     CARDIOVERSION N/A 03/21/2021   Procedure: CARDIOVERSION;  Surgeon: Sanda Klein, MD;  Location: [place];  Service: Cardiovascular;  Laterality: N/A;   CARDIOVERSION N/A 07/05/2022   Procedure: CARDIOVERSION;  Surgeon: Buford Dresser, MD;  Location: [facility] ENDOSCOPY;  Service: Cardiovascular;  Laterality: N/A;   ERCP Left 10/03/2014   Procedure: ENDOSCOPIC RETROGRADE CHOLANGIOPANCREATOGRAPHY (ERCP);  Surgeon: Missy Sabins, MD;  Location: [place];  Service: Gastroenterology;  Laterality: Left;   ERCP N/A 01/14/2015   Procedure: ENDOSCOPIC RETROGRADE CHOLANGIOPANCREATOGRAPHY (ERCP);  Surgeon: Jeryl Columbia, MD;  Location: [facility] ENDOSCOPY;  Service: Endoscopy;  Laterality: N/A;  need lithotripter/ordered/LH   SPHINCTEROTOMY  09/2014   SPYGLASS LITHOTRIPSY N/A 01/14/2015   Procedure: ZCHYIFOY LITHOTRIPSY;  Surgeon: Jeryl Columbia, MD;  Location: [facility] ENDOSCOPY;  Service: Endoscopy;  Laterality: N/A;    Family History  Problem Relation Age of  Onset   Other Mother    Heart attack Mother    Other Father     Social History   Tobacco Use   Smoking status: Never   Smokeless tobacco: Never  Substance Use Topics   Alcohol use: No   Drug use: No     Allergies  Allergen Reactions   Ursodiol Other (See Comments)    Severe Dizziness    Review of Systems NEGATIVE UNLESS OTHERWISE INDICATED IN HPI      Objective:     BP 138/60 (BP Location: Left Arm)   Pulse (!) 58   Temp 98.3 F (36.8 C) (Temporal)   Ht '5\' 4"'$  (1.626 m)   Wt 153 lb 9.6 oz (69.7 kg)   SpO2 96%   BMI 26.37 kg/m   Wt Readings from Last 3 Encounters:  08/28/22 153 lb 9.6 oz (69.7 kg)  07/27/22 154 lb 3.2 oz (69.9 kg)  07/19/22 153 lb 3.2 oz (69.5 kg)    BP Readings from Last 3 Encounters:  08/28/22 138/60  07/27/22 128/76  07/19/22 128/70     Physical Exam Vitals and nursing note reviewed.  Constitutional:      Appearance: Normal appearance. She is normal weight. She is not toxic-appearing.  HENT:     Head: Normocephalic and atraumatic.  Eyes:     Extraocular Movements: Extraocular movements intact.     Conjunctiva/sclera: Conjunctivae normal.     Pupils: Pupils are equal, round,  and reactive to light.  Cardiovascular:     Rate and Rhythm: Normal rate and regular rhythm.     Pulses: Normal pulses.     Heart sounds: Normal heart sounds.  Pulmonary:     Effort: Pulmonary effort is normal.     Breath sounds: Normal breath sounds.  Musculoskeletal:        General: Normal range of motion.     Cervical back: Normal range of motion and neck supple.  Skin:    General: Skin is warm and dry.     Comments: R hand raised crusted lesion  Neurological:     General: No focal deficit present.     Mental Status: She is alert and oriented to person, place, and time.  Psychiatric:        Mood and Affect: Mood normal.        Behavior: Behavior normal.        Assessment & Plan:  Neoplasm of uncertain behavior of skin Assessment &  Plan: Concern for possible SCC Referral to dermatology for assessment / removal   Orders: -     Ambulatory referral to Dermatology  Atrial fibrillation, unspecified type [facility]) Assessment & Plan: Currently follows with cardiology Stable on all medications No new concerns or changes per pt      Return in about 1 year (around 08/29/2023) for labs, recheck .    Louna Rothgeb M Demitrious Mccannon, PA-C

## 2022-08-28 NOTE — Assessment & Plan Note (Signed)
Currently follows with cardiology Stable on all medications No new concerns or changes per pt

## 2022-08-28 NOTE — Assessment & Plan Note (Signed)
Concern for possible SCC Referral to dermatology for assessment / removal

## 2022-09-01 ENCOUNTER — Other Ambulatory Visit: Payer: Self-pay | Admitting: Family Medicine

## 2022-09-17 ENCOUNTER — Encounter: Payer: Self-pay | Admitting: *Deleted

## 2022-11-04 ENCOUNTER — Other Ambulatory Visit: Payer: Self-pay | Admitting: Physician Assistant

## 2022-11-12 ENCOUNTER — Ambulatory Visit (INDEPENDENT_AMBULATORY_CARE_PROVIDER_SITE_OTHER): Payer: Medicare Other

## 2022-11-12 VITALS — Wt 153.0 lb

## 2022-11-12 DIAGNOSIS — Z0000 Encounter for general adult medical examination without abnormal findings: Secondary | ICD-10-CM | POA: Diagnosis not present

## 2022-11-12 NOTE — Patient Instructions (Signed)
Paige Levy , Thank you for taking time to come for your Medicare Wellness Visit. I appreciate your ongoing commitment to your health goals. Please review the following plan we discussed and let me know if I can assist you in the future.   These are the goals we discussed:  Goals      Patient Stated     Keep healthy and good diet         This is a list of the screening recommended for you and due dates:  Health Maintenance  Topic Date Due   Zoster (Shingles) Vaccine (1 of 2) Never done   COVID-19 Vaccine (2 - Pfizer risk series) 01/10/2021   Flu Shot  07/24/2022   DEXA scan (bone density measurement)  03/29/2023*   Medicare Annual Wellness Visit  11/13/2023   Pneumonia Vaccine  Completed   HPV Vaccine  Aged Out  *Topic was postponed. The date shown is not the original due date.    Advanced directives: Please bring a copy of your health care power of attorney and living will to the office at your convenience.  Conditions/risks identified: stay active   Next appointment: Follow up in one year for your annual wellness visit    Preventive Care 65 Years and Older, Female Preventive care refers to lifestyle choices and visits with your health care provider that can promote health and wellness. What does preventive care include? A yearly physical exam. This is also called an annual well check. Dental exams once or twice a year. Routine eye exams. Ask your health care provider how often you should have your eyes checked. Personal lifestyle choices, including: Daily care of your teeth and gums. Regular physical activity. Eating a healthy diet. Avoiding tobacco and drug use. Limiting alcohol use. Practicing safe sex. Taking low-dose aspirin every day. Taking vitamin and mineral supplements as recommended by your health care provider. What happens during an annual well check? The services and screenings done by your health care provider during your annual well check will depend on  your age, overall health, lifestyle risk factors, and family history of disease. Counseling  Your health care provider may ask you questions about your: Alcohol use. Tobacco use. Drug use. Emotional well-being. Home and relationship well-being. Sexual activity. Eating habits. History of falls. Memory and ability to understand (cognition). Work and work Statistician. Reproductive health. Screening  You may have the following tests or measurements: Height, weight, and BMI. Blood pressure. Lipid and cholesterol levels. These may be checked every 5 years, or more frequently if you are over 60 years old. Skin check. Lung cancer screening. You may have this screening every year starting at age 26 if you have a 30-pack-year history of smoking and currently smoke or have quit within the past 15 years. Fecal occult blood test (FOBT) of the stool. You may have this test every year starting at age 76. Flexible sigmoidoscopy or colonoscopy. You may have a sigmoidoscopy every 5 years or a colonoscopy every 10 years starting at age 26. Hepatitis C blood test. Hepatitis B blood test. Sexually transmitted disease (STD) testing. Diabetes screening. This is done by checking your blood sugar (glucose) after you have not eaten for a while (fasting). You may have this done every 1-3 years. Bone density scan. This is done to screen for osteoporosis. You may have this done starting at age 35. Mammogram. This may be done every 1-2 years. Talk to your health care provider about how often you should have regular  mammograms. Talk with your health care provider about your test results, treatment options, and if necessary, the need for more tests. Vaccines  Your health care provider may recommend certain vaccines, such as: Influenza vaccine. This is recommended every year. Tetanus, diphtheria, and acellular pertussis (Tdap, Td) vaccine. You may need a Td booster every 10 years. Zoster vaccine. You may need this  after age 50. Pneumococcal 13-valent conjugate (PCV13) vaccine. One dose is recommended after age 41. Pneumococcal polysaccharide (PPSV23) vaccine. One dose is recommended after age 36. Talk to your health care provider about which screenings and vaccines you need and how often you need them. This information is not intended to replace advice given to you by your health care provider. Make sure you discuss any questions you have with your health care provider. Document Released: 01/06/2016 Document Revised: 08/29/2016 Document Reviewed: 10/11/2015 Elsevier Interactive Patient Education  2017 [place] Prevention in the Home Falls can cause injuries. They can happen to people of all ages. There are many things you can do to make your home safe and to help prevent falls. What can I do on the outside of my home? Regularly fix the edges of walkways and driveways and fix any cracks. Remove anything that might make you trip as you walk through a door, such as a raised step or threshold. Trim any bushes or trees on the path to your home. Use bright outdoor lighting. Clear any walking paths of anything that might make someone trip, such as rocks or tools. Regularly check to see if handrails are loose or broken. Make sure that both sides of any steps have handrails. Any raised decks and porches should have guardrails on the edges. Have any leaves, snow, or ice cleared regularly. Use sand or salt on walking paths during winter. Clean up any spills in your garage right away. This includes oil or grease spills. What can I do in the bathroom? Use night lights. Install grab bars by the toilet and in the tub and shower. Do not use towel bars as grab bars. Use non-skid mats or decals in the tub or shower. If you need to sit down in the shower, use a plastic, non-slip stool. Keep the floor dry. Clean up any water that spills on the floor as soon as it happens. Remove soap buildup in the tub or  shower regularly. Attach bath mats securely with double-sided non-slip rug tape. Do not have throw rugs and other things on the floor that can make you trip. What can I do in the bedroom? Use night lights. Make sure that you have a light by your bed that is easy to reach. Do not use any sheets or blankets that are too big for your bed. They should not hang down onto the floor. Have a firm chair that has side arms. You can use this for support while you get dressed. Do not have throw rugs and other things on the floor that can make you trip. What can I do in the kitchen? Clean up any spills right away. Avoid walking on wet floors. Keep items that you use a lot in easy-to-reach places. If you need to reach something above you, use a strong step stool that has a grab bar. Keep electrical cords out of the way. Do not use floor polish or wax that makes floors slippery. If you must use wax, use non-skid floor wax. Do not have throw rugs and other things on the floor that can  make you trip. What can I do with my stairs? Do not leave any items on the stairs. Make sure that there are handrails on both sides of the stairs and use them. Fix handrails that are broken or loose. Make sure that handrails are as long as the stairways. Check any carpeting to make sure that it is firmly attached to the stairs. Fix any carpet that is loose or worn. Avoid having throw rugs at the top or bottom of the stairs. If you do have throw rugs, attach them to the floor with carpet tape. Make sure that you have a light switch at the top of the stairs and the bottom of the stairs. If you do not have them, ask someone to add them for you. What else can I do to help prevent falls? Wear shoes that: Do not have high heels. Have rubber bottoms. Are comfortable and fit you well. Are closed at the toe. Do not wear sandals. If you use a stepladder: Make sure that it is fully opened. Do not climb a closed stepladder. Make  sure that both sides of the stepladder are locked into place. Ask someone to hold it for you, if possible. Clearly mark and make sure that you can see: Any grab bars or handrails. First and last steps. Where the edge of each step is. Use tools that help you move around (mobility aids) if they are needed. These include: Canes. Walkers. Scooters. Crutches. Turn on the lights when you go into a dark area. Replace any light bulbs as soon as they burn out. Set up your furniture so you have a clear path. Avoid moving your furniture around. If any of your floors are uneven, fix them. If there are any pets around you, be aware of where they are. Review your medicines with your doctor. Some medicines can make you feel dizzy. This can increase your chance of falling. Ask your doctor what other things that you can do to help prevent falls. This information is not intended to replace advice given to you by your health care provider. Make sure you discuss any questions you have with your health care provider. Document Released: 10/06/2009 Document Revised: 05/17/2016 Document Reviewed: 01/14/2015 Elsevier Interactive Patient Education  2017 [employer].

## 2022-11-12 NOTE — Progress Notes (Signed)
I connected with  Paige Levy on 11/12/22 by a audio enabled telemedicine application and verified that I am speaking with the correct person using two identifiers.  Patient Location: Home  Provider Location: Office/Clinic  I discussed the limitations of evaluation and management by telemedicine. The patient expressed understanding and agreed to proceed.  Subjective:   Paige Levy is a 81 y.o. female who presents for an Initial Medicare Annual Wellness Visit.  Review of Systems     Cardiac Risk Factors include: advanced age (>37mn, >>31women);dyslipidemia;hypertension     Objective:    Today's Vitals   11/12/22 1009  Weight: 153 lb (69.4 kg)   Body mass index is 26.26 kg/m.     11/12/2022   10:12 AM 07/05/2022   11:49 AM 04/27/2021    8:27 PM 03/21/2021    7:49 AM 01/14/2015    7:05 AM 10/20/2014    7:39 AM 10/01/2014   11:40 AM  Advanced Directives  Does Patient Have a Medical Advance Directive? Yes Yes No Yes Yes No No  Type of AParamedicof [place]ron[place]Living will Living will  [place]Living will [place]lew Lake[place] will    Copy of [place] Chart? No - copy requested   No - copy requested No - copy requested    Would patient like information on creating a medical advance directive?   No - Patient declined   No - patient declined information No - patient declined information    Current Medications (verified) Outpatient Encounter Medications as of 11/12/2022  Medication Sig   amiodarone (PACERONE) 200 MG tablet Take 1 tablet (200 mg total) by mouth daily.   diltiazem (CARDIZEM) 30 MG tablet TAKE 1 TABLET DAILY AS NEEDED FOR HEART RATE ABOVE 120 BEATS PER MINUTE (RAPID ATRIAL FIBRILLATION)   ELIQUIS 5 MG TABS tablet TAKE 1 TABLET BY MOUTH TWICE A DAY   levothyroxine (SYNTHROID) 88 MCG tablet TAKE 1 TABLET BY MOUTH EVERY DAY BEFORE BREAKFAST   metoprolol succinate (TOPROL XL) 25 MG  24 hr tablet Take 1 tablet (25 mg total) by mouth daily.   Multiple Vitamin (MULTIVITAMIN WITH MINERALS) TABS tablet Take 2 tablets by mouth daily. Gummies   olopatadine (PATADAY) 0.1 % ophthalmic solution Place 1 drop into both eyes daily as needed for allergies.   Polyethyl Glycol-Propyl Glycol (SYSTANE OP) Place 1 drop into both eyes 2 (two) times daily as needed (dry eyes).   Potassium Gluconate 550 MG TABS Take 550 mg by mouth daily.   rosuvastatin (CRESTOR) 10 MG tablet TAKE 1 TABLET BY MOUTH EVERY DAY   triamterene-hydrochlorothiazide (MAXZIDE-25) 37.5-25 MG tablet TAKE 1 TABLET BY MOUTH EVERY DAY   No facility-administered encounter medications on file as of 11/12/2022.    Allergies (verified) Ursodiol   History: Past Medical History:  Diagnosis Date   Arthritis    Atrial fibrillation ([place]    Hyperlipemia    Hypothyroidism    Meniere's disease    Thyroid disease    Past Surgical History:  Procedure Laterality Date   ABDOMINAL HYSTERECTOMY     CARDIOVERSION N/A 03/21/2021   Procedure: CARDIOVERSION;  Surgeon: CSanda Klein MD;  [place]  Service: Cardiovascular;  Laterality: N/A;   CARDIOVERSION N/A 07/05/2022   Procedure: CARDIOVERSION;  Surgeon: CBuford Dresser MD;  [facility]  Service: Cardiovascular;  Laterality: N/A;   ERCP Left 10/03/2014   Procedure: ENDOSCOPIC RETROGRADE CHOLANGIOPANCREATOGRAPHY (ERCP);  Surgeon: JMissy Sabins MD;  [facility];  Service: Gastroenterology;  Laterality: Left;   ERCP N/A 01/14/2015   Procedure: ENDOSCOPIC RETROGRADE CHOLANGIOPANCREATOGRAPHY (ERCP);  Surgeon: Jeryl Columbia, MD;  [facility];  Service: Endoscopy;  Laterality: N/A;  need lithotripter/ordered/LH   SPHINCTEROTOMY  09/2014   SPYGLASS LITHOTRIPSY N/A 01/14/2015   Procedure: TXHFSFSE LITHOTRIPSY;  Surgeon: Jeryl Columbia, MD;  [facility];  Service: Endoscopy;  Laterality: N/A;   Family History  Problem  Relation Age of Onset   Other Mother    Heart attack Mother    Other Father    Social History   Socioeconomic History   Marital status: Married    Spouse name: Not on file   Number of children: Not on file   Years of education: Not on file   Highest education level: Not on file  Occupational History   Not on file  Tobacco Use   Smoking status: Never   Smokeless tobacco: Never  Substance and Sexual Activity   Alcohol use: No   Drug use: No   Sexual activity: Never  Other Topics Concern   Not on file  Social History Narrative   Not on file   Social Determinants of Health   Financial Resource Strain: Low Risk  (11/12/2022)   Overall Financial Resource Strain (CARDIA)    Difficulty of Paying Living Expenses: Not hard at all  Food Insecurity: No Food Insecurity (11/12/2022)   Hunger Vital Sign    Worried About Running Out of Food in the Last Year: Never true    [place] in the Last Year: Never true  Transportation Needs: No Transportation Needs (11/12/2022)   PRAPARE - Hydrologist (Medical): No    Lack of Transportation (Non-Medical): No  Physical Activity: Sufficiently Active (11/12/2022)   Exercise Vital Sign    Days of Exercise per Week: 7 days    Minutes of Exercise per Session: 30 min  Stress: No Stress Concern Present (11/12/2022)   [place]    Feeling of Stress : Not at all  Social Connections: Moderately Integrated (11/12/2022)   Social Connection and Isolation Panel [NHANES]    Frequency of Communication with Friends and Family: Once a week    Frequency of Social Gatherings with Friends and Family: More than three times a week    Attends Religious Services: 1 to 4 times per year    Active Member of [employer] or Organizations: No    Attends Music therapist: Never    Marital Status: Married    Tobacco Counseling Counseling given: Not  Answered   Clinical Intake:  Pre-visit preparation completed: Yes  Pain : No/denies pain     BMI - recorded: 26.26 Nutritional Status: BMI 25 -29 Overweight Nutritional Risks: None Diabetes: No  How often do you need to have someone help you when you read instructions, pamphlets, or other written materials from your doctor or pharmacy?: 1 - Never  Diabetic?no  Interpreter Needed?: No  Information entered by :: Charlott Rakes, LPN   Activities of Daily Living    11/12/2022   10:13 AM  In your present state of health, do you have any difficulty performing the following activities:  Hearing? 0  Vision? 0  Difficulty concentrating or making decisions? 0  Walking or climbing stairs? 0  Dressing or bathing? 0  Doing errands, shopping? 0  Preparing Food and eating ? N  Using the Toilet?  N  In the past six months, have you accidently leaked urine? N  Do you have problems with loss of bowel control? N  Managing your Medications? N  Managing your Finances? N  Housekeeping or managing your Housekeeping? N    Patient Care Team: [place], Randa Evens, PA-C as PCP - General (Physician Assistant) Buford Dresser, MD as Consulting Physician (Cardiology)  Indicate any recent Medical Services you may have received from other than [facility] providers in the past year (date may be approximate).     Assessment:   This is a routine wellness examination for Raymond.  Hearing/Vision screen Hearing Screening - Comments:: Pt is HOH with hearing aids  Vision Screening - Comments:: Pt follows up with [facility]  for annul eye exams   Dietary issues and exercise activities discussed: Current Exercise Habits: Home exercise routine, Type of exercise: walking, Time (Minutes): 30, Frequency (Times/Week): 7, Weekly Exercise (Minutes/Week): 210   Goals Addressed             This Visit's Progress    Patient Stated       Keep healthy and good diet        Depression  Screen    11/12/2022   10:11 AM 08/28/2022    2:07 PM 08/03/2020    9:30 AM  PHQ 2/9 Scores  PHQ - 2 Score 0 0 0    Fall Risk    11/12/2022   10:13 AM 03/28/2022   10:02 AM 02/08/2021    9:33 AM 08/03/2020    9:27 AM  Fall Risk   Falls in the past year? 0 0 0 1  Number falls in past yr: 0 0  0  Injury with Fall? 0 0  1  Comment    Bruising  Risk for fall due to : Impaired vision;Impaired balance/gait No Fall Risks No Fall Risks   Follow up Falls prevention discussed       FALL RISK PREVENTION PERTAINING TO THE HOME:  Any stairs in or around the home? No  If so, are there any without handrails? No  Home free of loose throw rugs in walkways, pet beds, electrical cords, etc? Yes  Adequate lighting in your home to reduce risk of falls? Yes   ASSISTIVE DEVICES UTILIZED TO PREVENT FALLS:  Life alert? No  Use of a cane, walker or w/c? No  Grab bars in the bathroom? No  Shower chair or bench in shower? No  Elevated toilet seat or a handicapped toilet? No   TIMED UP AND GO:  Was the test performed? No .   Cognitive Function:        11/12/2022   10:14 AM  6CIT Screen  What Year? 0 points  What month? 0 points  What time? 0 points  Count back from 20 0 points  Months in reverse 0 points  Repeat phrase 0 points  Total Score 0 points    Immunizations Immunization History  Administered Date(s) Administered   Influenza Split 10/31/2012   Influenza, High Dose Seasonal PF 10/19/2014, 10/26/2015, 10/30/2016, 10/07/2017, 09/30/2018, 11/18/2019, 10/25/2020, 11/16/2021   Influenza,inj,quad, With Preservative 11/09/2013   Influenza-Unspecified 10/19/2014   PFIZER(Purple Top)SARS-COV-2 Vaccination 12/20/2020   Pneumococcal Conjugate-13 10/30/2016   Pneumococcal Polysaccharide-23 08/16/2005   Td 09/04/2006    TDAP status: Due, Education has been provided regarding the importance of this vaccine. Advised may receive this vaccine at local pharmacy or Health Dept. Aware to  provide a copy of the vaccination record if obtained from  local pharmacy or Health Dept. Verbalized acceptance and understanding.  Flu Vaccine status: Up to date  Pneumococcal vaccine status: Up to date  Covid-19 vaccine status: Completed vaccines  Qualifies for Shingles Vaccine? Yes   Zostavax completed No   Shingrix Completed?: No.    Education has been provided regarding the importance of this vaccine. Patient has been advised to call insurance company to determine out of pocket expense if they have not yet received this vaccine. Advised may also receive vaccine at local pharmacy or Health Dept. Verbalized acceptance and understanding.  Screening Tests Health Maintenance  Topic Date Due   Zoster Vaccines- Shingrix (1 of 2) Never done   COVID-19 Vaccine (2 - Pfizer risk series) 01/10/2021   INFLUENZA VACCINE  07/24/2022   DEXA SCAN  03/29/2023 (Originally 09/18/2004)   Medicare Annual Wellness (AWV)  11/13/2023   Pneumonia Vaccine 61+ Years old  Completed   HPV VACCINES  Aged Out    Health Maintenance  Health Maintenance Due  Topic Date Due   Zoster Vaccines- Shingrix (1 of 2) Never done   COVID-19 Vaccine (2 - Pfizer risk series) 01/10/2021   INFLUENZA VACCINE  07/24/2022    Colorectal cancer screening: No longer required.   Mammogram status: No longer required due to age .   Additional Screening:   Vision Screening: Recommended annual ophthalmology exams for early detection of glaucoma and other disorders of the eye. Is the patient up to date with their annual eye exam?  Yes  Who is the provider or what is the name of the office in which the patient attends annual eye exams? [facility]  If pt is not established with a provider, would they like to be referred to a provider to establish care? No .   Dental Screening: Recommended annual dental exams for proper oral hygiene  Community Resource Referral / Chronic Care Management: CRR required this visit?  No   CCM  required this visit?  No      Plan:     I have personally reviewed and noted the following in the patient's chart:   Medical and social history Use of alcohol, tobacco or illicit drugs  Current medications and supplements including opioid prescriptions. Patient is not currently taking opioid prescriptions. Functional ability and status Nutritional status Physical activity Advanced directives List of other physicians Hospitalizations, surgeries, and ER visits in previous 12 months Vitals Screenings to include cognitive, depression, and falls Referrals and appointments  In addition, I have reviewed and discussed with patient certain preventive protocols, quality metrics, and best practice recommendations. A written personalized care plan for preventive services as well as general preventive health recommendations were provided to patient.     Willette Brace, LPN   [id number]   Nurse Notes: none

## 2022-12-06 ENCOUNTER — Encounter: Payer: Self-pay | Admitting: *Deleted

## 2022-12-19 NOTE — Progress Notes (Signed)
Cardiology Office Note:    Date:  12/19/2022   ID:  Paige Levy, DOB [birth year], MRN [id number]  PCP:  Fredirick Lathe, PA-C  Cardiologist:  None  CC: follow up  History of Present Illness:    Paige Levy is a 80 y.o. female with a hx of paroxysmal atrial fibrillation, hypothyroidism, hyperlipidemia, hypertension who is seen for follow up today. I initially met her 02/27/21 as a new consult for the evaluation and management of atrial fibrillation.  S/P cardioversion 07/05/22; return of afib shortly thereafter. One week prior to her last visit we stopped Multaq and started amiodarone. She was still on the 400 mg BID dose until the following AM. She brought a log of home HR and blood pressure. Heart rate had been elevated >100 bpm as recently as that AM. On ECG she was in sinus brady at 53 bpm. She could feel a difference. Doing very well on the amiodarone, no issues. Discussed monitoring. We decreased metoprolol to 25 mg BID.  On 07/23/2022 she reported low heart rates in the 40's with fatigue and lightheadedness. Blood pressures 100-110/60-70s. Metoprolol was stopped and she was scheduled for follow-up with Laurann Montana, NP 07/27/2022. That morning she was in sinus rhythm per Kardia mobile; confirmed by EKG. She had an episode of Afib the previous day. She was instructed to take metoprolol 25 mg and then diltiazem 30 mg later when heart rate not improved.  Today, she is accompanied by a family member. Overall she is feeling good. She complains of constantly feeling cold and bruising easily. She denies any bleeding issues on Eliquis.  She reports stable heart rates, typically in the high 50's at home. She denies feeling any recent episodes of atrial fibrillation. Every so often she will monitor her rhythm with her Kardia mobile.  For exercise she is walking every day. No exertional symptoms.  She denies any palpitations, chest pain, shortness of breath, or peripheral edema. No  lightheadedness, headaches, syncope, orthopnea, or PND.   Past Medical History:  Diagnosis Date   Arthritis    Atrial fibrillation ([place])    Hyperlipemia    Hypothyroidism    Meniere's disease    Thyroid disease     Past Surgical History:  Procedure Laterality Date   ABDOMINAL HYSTERECTOMY     CARDIOVERSION N/A 03/21/2021   Procedure: CARDIOVERSION;  Surgeon: Sanda Klein, MD;  Location: [place];  Service: Cardiovascular;  Laterality: N/A;   CARDIOVERSION N/A 07/05/2022   Procedure: CARDIOVERSION;  Surgeon: Buford Dresser, MD;  [facility] ENDOSCOPY;  Service: Cardiovascular;  Laterality: N/A;   ERCP Left 10/03/2014   Procedure: ENDOSCOPIC RETROGRADE CHOLANGIOPANCREATOGRAPHY (ERCP);  Surgeon: Missy Sabins, MD;  Location: [place];  Service: Gastroenterology;  Laterality: Left;   ERCP N/A 01/14/2015   Procedure: ENDOSCOPIC RETROGRADE CHOLANGIOPANCREATOGRAPHY (ERCP);  Surgeon: Jeryl Columbia, MD;  Location: [facility] ENDOSCOPY;  Service: Endoscopy;  Laterality: N/A;  need lithotripter/ordered/LH   SPHINCTEROTOMY  09/2014   SPYGLASS LITHOTRIPSY N/A 01/14/2015   Procedure: [id number] LITHOTRIPSY;  Surgeon: Jeryl Columbia, MD;  Location: [facility] ENDOSCOPY;  Service: Endoscopy;  Laterality: N/A;    Current Medications: Current Outpatient Medications on File Prior to Visit  Medication Sig   amiodarone (PACERONE) 200 MG tablet Take 1 tablet (200 mg total) by mouth daily.   diltiazem (CARDIZEM) 30 MG tablet TAKE 1 TABLET DAILY AS NEEDED FOR HEART RATE ABOVE 120 BEATS PER MINUTE (RAPID ATRIAL FIBRILLATION)   ELIQUIS 5 MG TABS tablet TAKE 1 TABLET  BY MOUTH TWICE A DAY   levothyroxine (SYNTHROID) 88 MCG tablet TAKE 1 TABLET BY MOUTH EVERY DAY BEFORE BREAKFAST   metoprolol succinate (TOPROL XL) 25 MG 24 hr tablet Take 1 tablet (25 mg total) by mouth daily.   Multiple Vitamin (MULTIVITAMIN WITH MINERALS) TABS tablet Take 2 tablets by mouth daily. Gummies   olopatadine (PATADAY) 0.1 % ophthalmic  solution Place 1 drop into both eyes daily as needed for allergies.   Polyethyl Glycol-Propyl Glycol (SYSTANE OP) Place 1 drop into both eyes 2 (two) times daily as needed (dry eyes).   Potassium Gluconate 550 MG TABS Take 550 mg by mouth daily.   rosuvastatin (CRESTOR) 10 MG tablet TAKE 1 TABLET BY MOUTH EVERY DAY   triamterene-hydrochlorothiazide (MAXZIDE-25) 37.5-25 MG tablet TAKE 1 TABLET BY MOUTH EVERY DAY   No current facility-administered medications on file prior to visit.     Allergies:   Ursodiol   Social History   Tobacco Use   Smoking status: Never   Smokeless tobacco: Never  Substance Use Topics   Alcohol use: No   Drug use: No    Family History: family history includes Heart attack in her mother; Other in her father and mother.  ROS:   Please see the history of present illness.  (+) Coldness (+)  Easy bruising Additional pertinent ROS otherwise unremarkable.  EKGs/Labs/Other Studies Reviewed:    The following studies were reviewed today:  CTA Chest 02/15/22 IMPRESSION: No evidence of pulmonary embolism.   Tiny hiatal hernia.   Aortic Atherosclerosis (ICD10-I70.0).  Echo 03/24/21  1. Left ventricular ejection fraction, by estimation, is 55 to 60%. The  left ventricle has normal function. The left ventricle has no regional  wall motion abnormalities. Left ventricular diastolic parameters are  indeterminate.   2. Right ventricular systolic function is normal. The right ventricular  size is normal.   3. The mitral valve is normal in structure. Mild mitral valve  regurgitation. No evidence of mitral stenosis.   4. The aortic valve is tricuspid. Aortic valve regurgitation is not  visualized. Mild to moderate aortic valve sclerosis/calcification is  present, without any evidence of aortic stenosis.   5. The inferior vena cava is normal in size with greater than 50%  respiratory variability, suggesting right atrial pressure of 3 mmHg.  EKG:  EKG is  personally reviewed. 12/27/2022:  sinus bradycardia at 58 bpm 07/19/22 sinus bradycardia at 53 bpm, with 1st degree AV block 02/27/22: Sinus bradycardia, rate 55 bpm 08/30/21 SR with PACs, 65 bpm 03/29/21 sinus rhythm with PACs at 63 bpm. 02/27/21: atrial fibrillation with RVR at 106 bpm  Recent Labs: 02/15/2022: B Natriuretic Peptide 249.8 03/28/2022: ALT 13 06/21/2022: BUN 15; Creatinine, Ser 1.10; Hemoglobin 14.5; Magnesium 2.2; Platelets 256; Potassium 3.6; Sodium 140; TSH 1.390   Recent Lipid Panel    Component Value Date/Time   CHOL 165 03/28/2022 1027   TRIG 120.0 03/28/2022 1027   HDL 62.40 03/28/2022 1027   CHOLHDL 3 03/28/2022 1027   VLDL 24.0 03/28/2022 1027   LDLCALC 79 03/28/2022 1027   LDLCALC 195 (H) 08/03/2020 1029    Physical Exam:    VS:  BP 132/68 (BP Location: Right Arm, Patient Position: Sitting, Cuff Size: Normal)   Pulse (!) 58   Ht $R'5\' 4"'ht$  (1.626 m)   Wt 155 lb 12.8 oz (70.7 kg)   BMI 26.74 kg/m     Wt Readings from Last 3 Encounters:  11/12/22 153 lb (69.4 kg)  08/28/22 153 lb  9.6 oz (69.7 kg)  07/27/22 154 lb 3.2 oz (69.9 kg)    GEN: Well nourished, well developed in no acute distress HEENT: Normal, moist mucous membranes NECK: No JVD CARDIAC: regular rhythm, normal S1 and S2, no rubs or gallops. 1/6 soft systolic murmur. VASCULAR: Radial and DP pulses 2+ bilaterally. No carotid bruits RESPIRATORY:  Clear to auscultation without rales, wheezing or rhonchi  ABDOMEN: Soft, non-tender, non-distended MUSCULOSKELETAL:  Ambulates independently SKIN: Warm and dry, no edema NEUROLOGIC:  Alert and oriented x 3. No focal neuro deficits noted. PSYCHIATRIC:  Normal affect    ASSESSMENT:    1. Paroxysmal atrial fibrillation (HCC)   2. Hypercoagulable state ([place])   3. Current use of long term anticoagulation   4. Essential hypertension   5. Pure hypercholesterolemia     PLAN:    atrial fibrillation, paroxysmal: -CHA2DS2/VAS Stroke Risk Points=3 -continue  apixaban 5 mg BID (only meets age criteria for reduction, not weight/Cr) -echo unremarkable -had cardioversion 7/13 with return to afib several days later -changed multaq to amiodarone 7/21. Doing very well with this. Now in sinus brady -tolerating metoprolol succinate 25 mg daily  Hypertension:  -at goal today -has history of hypokalemia. May need to change from triamterene-HCTZ if K is difficult to manage  Hypercholesterolemia: -continue rosuvastatin 10 mg daily  Cardiac risk counseling and prevention recommendations: -recommend heart healthy/Mediterranean diet, with whole grains, fruits, vegetable, fish, lean meats, nuts, and olive oil. Limit salt. -recommend moderate walking, 3-5 times/week for 30-50 minutes each session. Aim for at least 150 minutes.week. Goal should be pace of 3 miles/hours, or walking 1.5 miles in 30 minutes -recommend avoidance of tobacco products. Avoid excess alcohol.  Plan for follow up: 6 months or sooner as needed  Buford Dresser, MD, PhD, [facility]    Medication Adjustments/Labs and Tests Ordered: Current medicines are reviewed at length with the patient today.  Concerns regarding medicines are outlined above.   Orders Placed This Encounter  Procedures   EKG 12-Lead   No orders of the defined types were placed in this encounter.  Patient Instructions  Medication Instructions:  Your Physician recommend you continue on your current medication as directed.    *If you need a refill on your cardiac medications before your next appointment, please call your pharmacy*  Follow-Up: At [facility], you and your health needs are our priority.  As part of our continuing mission to provide you with exceptional heart care, we have created designated Provider Care Teams.  These Care Teams include your primary Cardiologist (physician) and Advanced Practice Providers (APPs -  Physician Assistants and Nurse Practitioners)  who all work together to provide you with the care you need, when you need it.  We recommend signing up for the patient portal called "MyChart".  Sign up information is provided on this After Visit Summary.  MyChart is used to connect with patients for Virtual Visits (Telemedicine).  Patients are able to view lab/test results, encounter notes, upcoming appointments, etc.  Non-urgent messages can be sent to your provider as well.   To learn more about what you can do with MyChart, go to [email or web address].    Your next appointment:   6 month(s)  The format for your next appointment:   In Person  Provider:   Buford Dresser, MD     [facility],acting as a scribe for Buford Dresser, MD.,have documented all relevant documentation on the behalf of Buford Dresser, MD,as directed by  [employer]  Harrell Gave, MD while in the presence of Buford Dresser, MD.  I, Buford Dresser, MD, have reviewed all documentation for this visit. The documentation on 02/10/23 for the exam, diagnosis, procedures, and orders are all accurate and complete.   Signed, Buford Dresser, MD PhD 02/10/2023    [place]

## 2022-12-20 ENCOUNTER — Other Ambulatory Visit: Payer: Self-pay | Admitting: Physician Assistant

## 2022-12-27 ENCOUNTER — Encounter (HOSPITAL_BASED_OUTPATIENT_CLINIC_OR_DEPARTMENT_OTHER): Payer: Self-pay | Admitting: Cardiology

## 2022-12-27 ENCOUNTER — Ambulatory Visit (HOSPITAL_BASED_OUTPATIENT_CLINIC_OR_DEPARTMENT_OTHER): Payer: Medicare Other | Admitting: Cardiology

## 2022-12-27 VITALS — BP 132/68 | HR 58 | Ht 64.0 in | Wt 155.8 lb

## 2022-12-27 DIAGNOSIS — D6859 Other primary thrombophilia: Secondary | ICD-10-CM | POA: Diagnosis not present

## 2022-12-27 DIAGNOSIS — I10 Essential (primary) hypertension: Secondary | ICD-10-CM

## 2022-12-27 DIAGNOSIS — Z7901 Long term (current) use of anticoagulants: Secondary | ICD-10-CM

## 2022-12-27 DIAGNOSIS — I480 Paroxysmal atrial fibrillation: Secondary | ICD-10-CM | POA: Diagnosis not present

## 2022-12-27 DIAGNOSIS — E7800 Pure hypercholesterolemia, unspecified: Secondary | ICD-10-CM

## 2022-12-27 NOTE — Patient Instructions (Signed)
Medication Instructions:  Your Physician recommend you continue on your current medication as directed.    *If you need a refill on your cardiac medications before your next appointment, please call your pharmacy*  Follow-Up: At [facility], you and your health needs are our priority.  As part of our continuing mission to provide you with exceptional heart care, we have created designated Provider Care Teams.  These Care Teams include your primary Cardiologist (physician) and Advanced Practice Providers (APPs -  Physician Assistants and Nurse Practitioners) who all work together to provide you with the care you need, when you need it.  We recommend signing up for the patient portal called "MyChart".  Sign up information is provided on this After Visit Summary.  MyChart is used to connect with patients for Virtual Visits (Telemedicine).  Patients are able to view lab/test results, encounter notes, upcoming appointments, etc.  Non-urgent messages can be sent to your provider as well.   To learn more about what you can do with MyChart, go to [email or web address].    Your next appointment:   6 month(s)  The format for your next appointment:   In Person  Provider:   Buford Dresser, MD

## 2023-01-17 IMAGING — DX DG CHEST 1V PORT
1 series · 1 of 1 positions shown · non-contrast
Comparison: October 01, 2014.

CLINICAL DATA: Atrial fibrillation.

EXAM:
PORTABLE CHEST 1 VIEW

[chest]
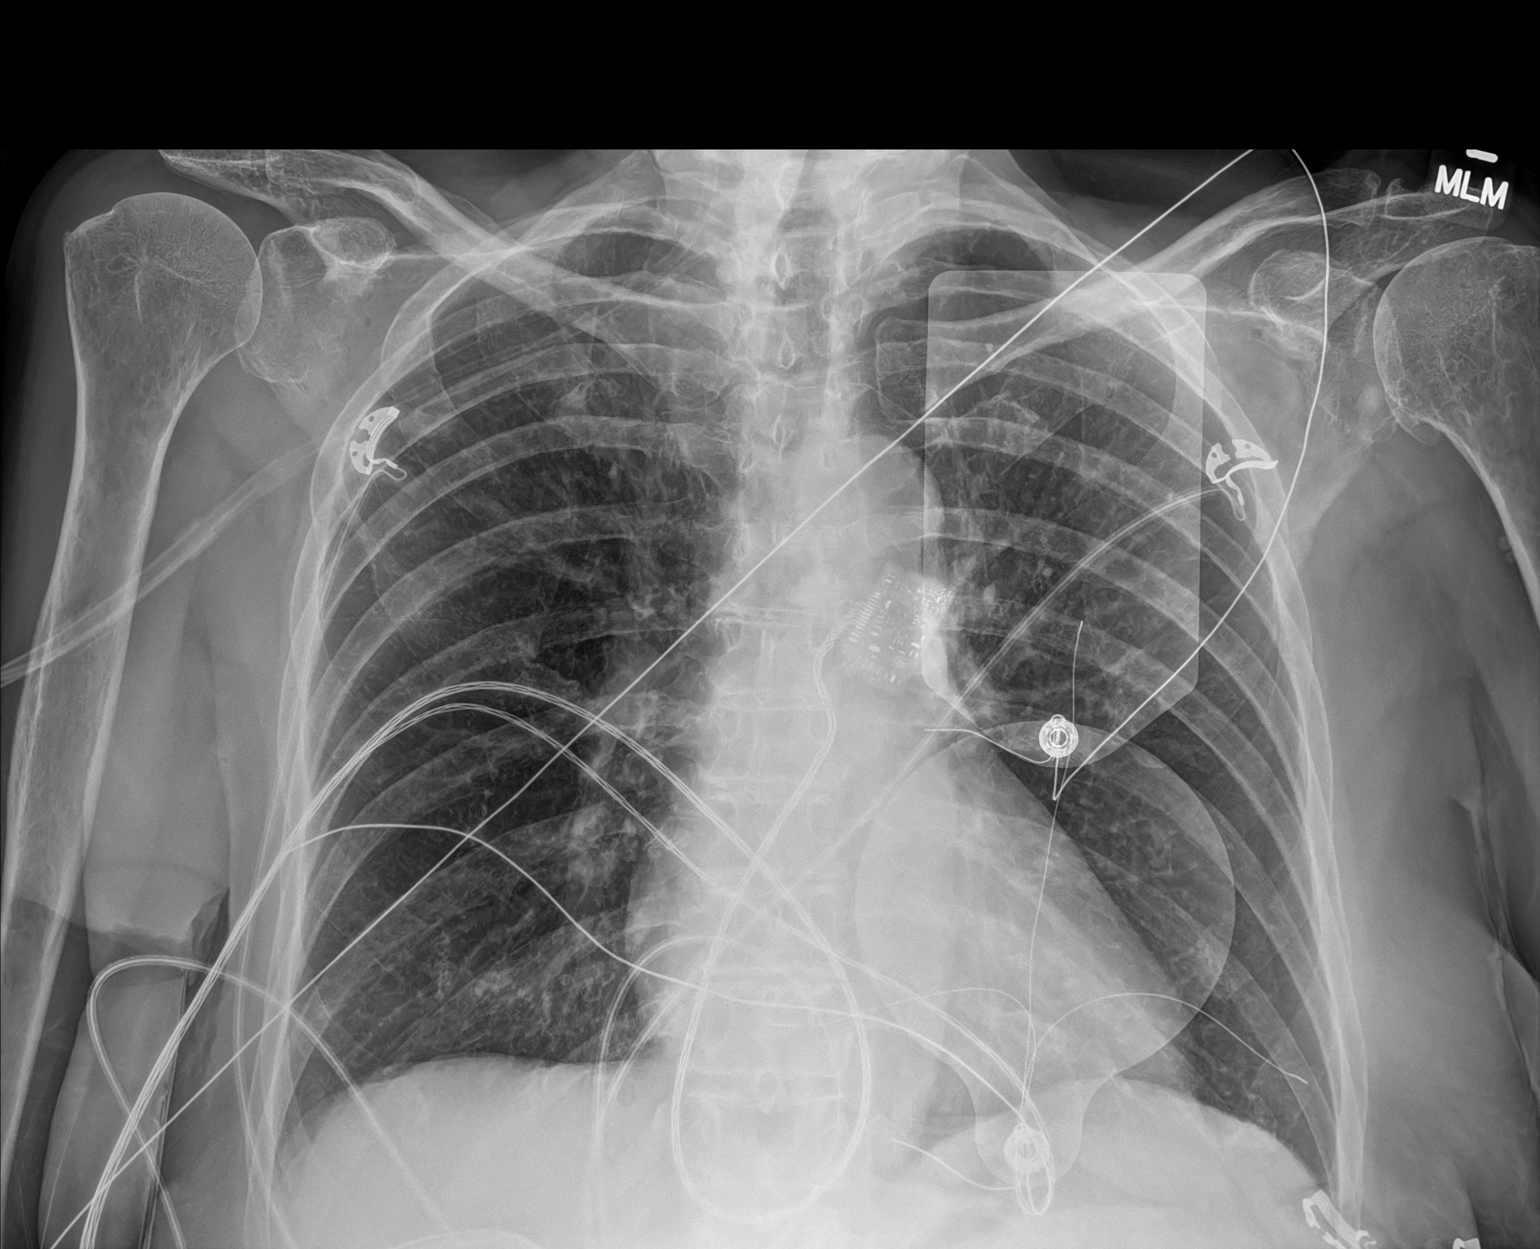

[1 of 1 positions shown; findings below may reference images not displayed]

FINDINGS: The heart size and mediastinal contours are within normal limits.
Both lungs are clear. The visualized skeletal structures are
unremarkable.
IMPRESSION: No active disease.

## 2023-01-31 ENCOUNTER — Encounter (HOSPITAL_COMMUNITY): Payer: Self-pay | Admitting: *Deleted

## 2023-02-03 IMAGING — DX DG CHEST 2V
2 series · 2 of 2 positions shown · non-contrast
Comparison: April 10, 2021

CLINICAL DATA: Weakness.

EXAM:
CHEST - 2 VIEW

[chest pa]
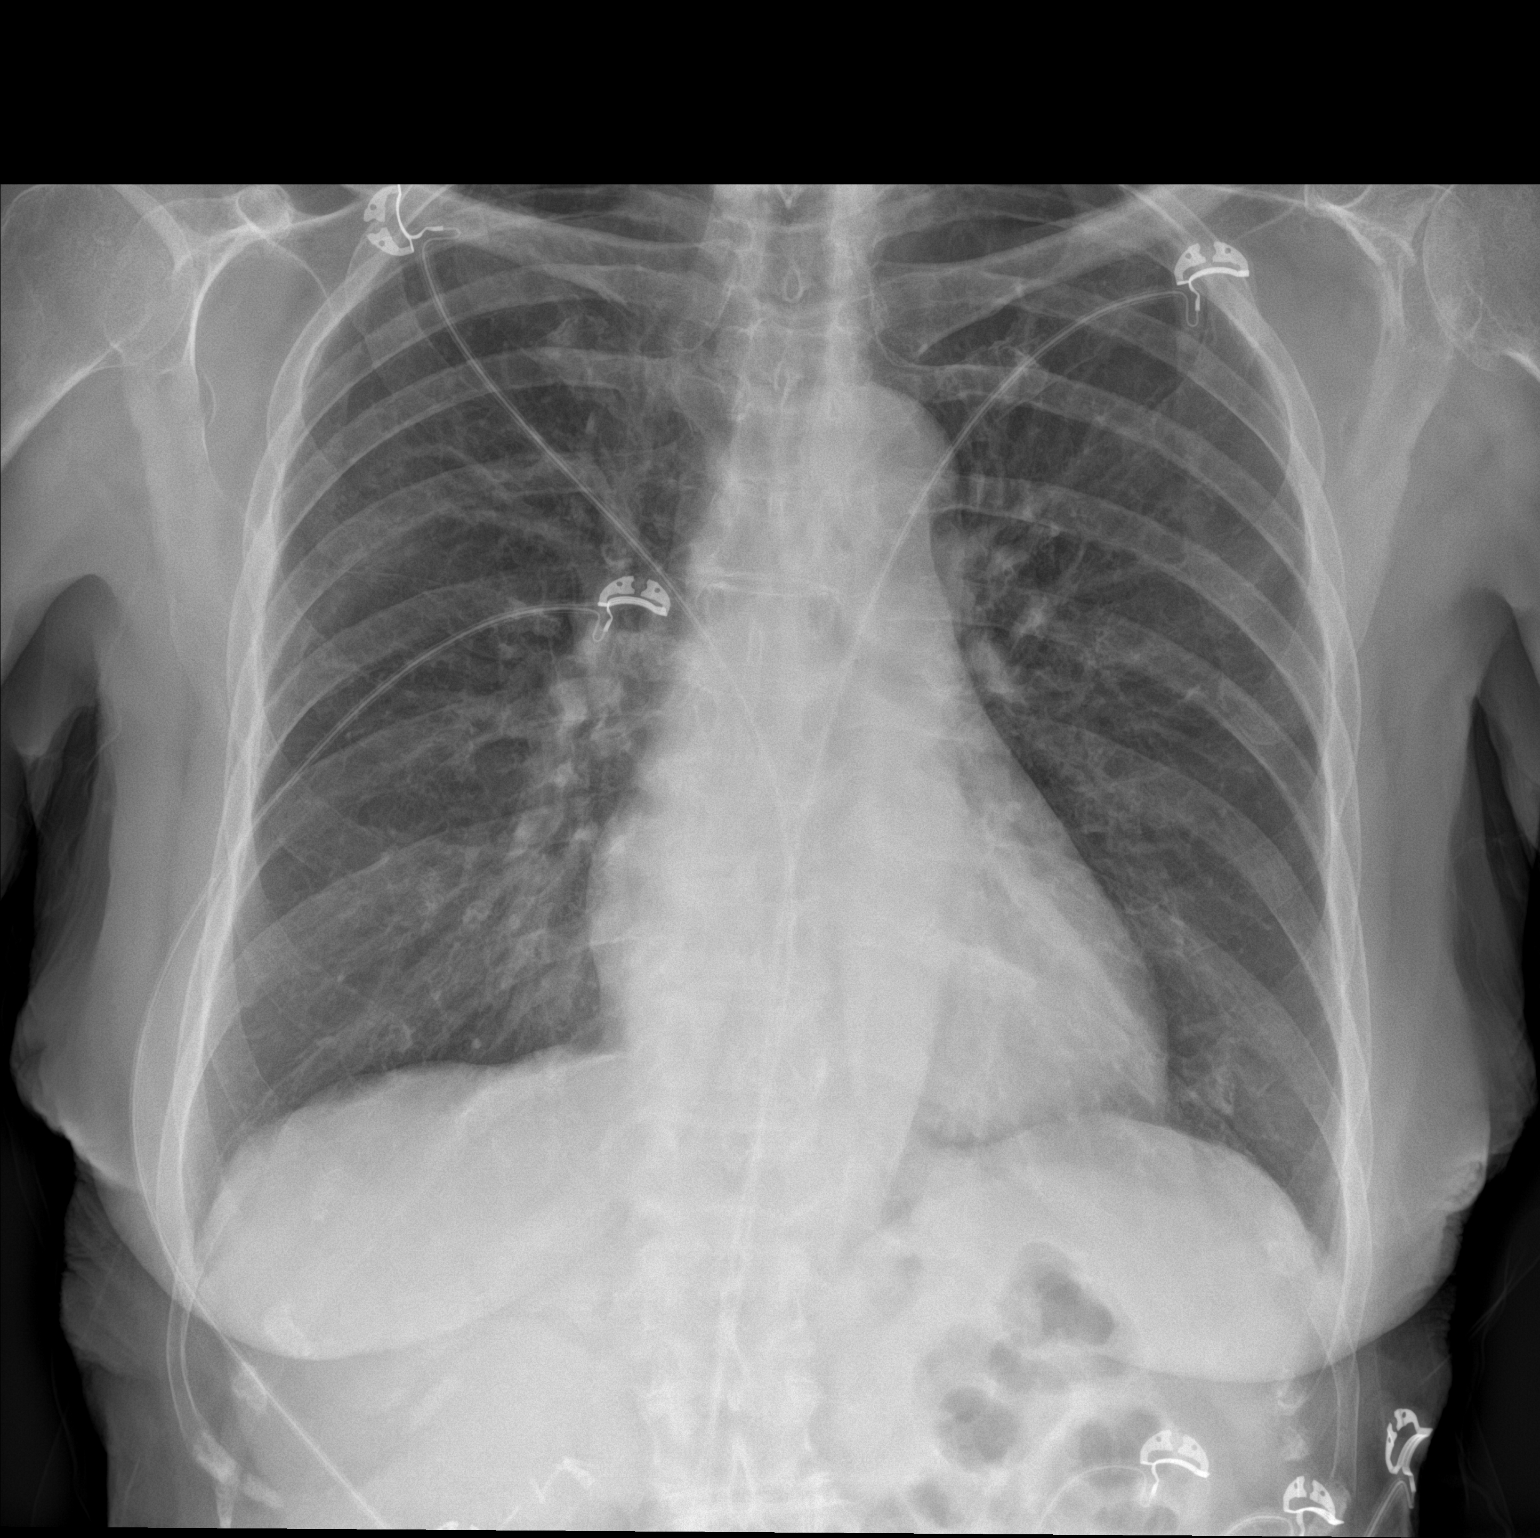

[chest lat]
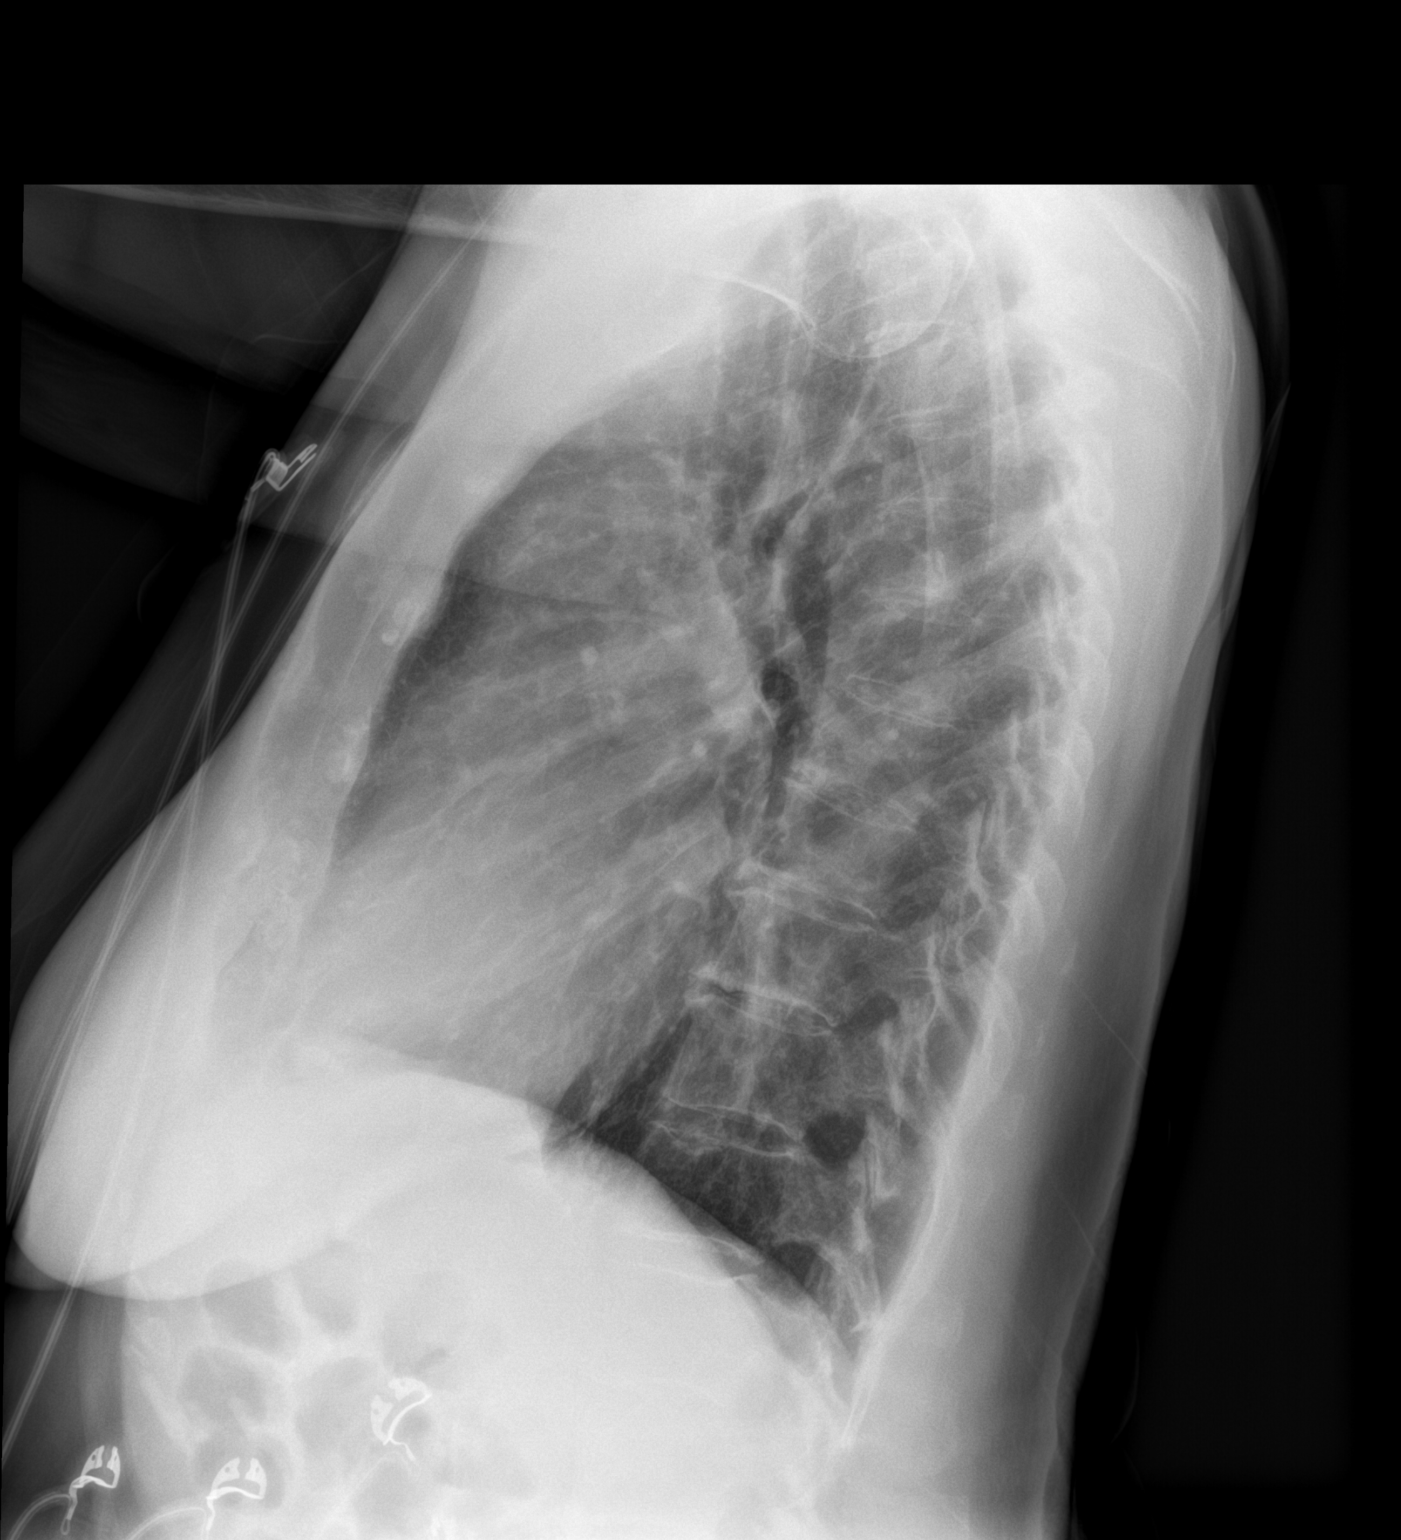

[2 of 2 positions shown; findings below may reference images not displayed]

FINDINGS: The lungs are hyperinflated. Chronic appearing increased
interstitial lung markings are seen. There is no evidence of acute
infiltrate, pleural effusion or pneumothorax. The heart size and
mediastinal contours are within normal limits. Radiopaque surgical
clips are seen overlying the right upper quadrant. The visualized
skeletal structures are unremarkable.
IMPRESSION: No active cardiopulmonary disease.

## 2023-02-06 ENCOUNTER — Other Ambulatory Visit: Payer: Self-pay | Admitting: Cardiology

## 2023-02-06 DIAGNOSIS — I4891 Unspecified atrial fibrillation: Secondary | ICD-10-CM

## 2023-02-06 NOTE — Telephone Encounter (Signed)
Prescription refill request for Eliquis received. Indication: a fib Last office visit: 12/27/22 Scr: 1.1 06/21/22 Age: 82 Weight: 70kg

## 2023-02-10 ENCOUNTER — Encounter (HOSPITAL_BASED_OUTPATIENT_CLINIC_OR_DEPARTMENT_OTHER): Payer: Self-pay | Admitting: Cardiology

## 2023-02-24 ENCOUNTER — Other Ambulatory Visit: Payer: Self-pay | Admitting: Family Medicine

## 2023-02-24 ENCOUNTER — Other Ambulatory Visit (HOSPITAL_BASED_OUTPATIENT_CLINIC_OR_DEPARTMENT_OTHER): Payer: Self-pay | Admitting: Family

## 2023-02-25 NOTE — Telephone Encounter (Signed)
Rx(s) sent to pharmacy electronically.  

## 2023-02-27 ENCOUNTER — Other Ambulatory Visit: Payer: Self-pay

## 2023-02-27 MED ORDER — LEVOTHYROXINE SODIUM 88 MCG PO TABS: ORAL_TABLET | ORAL | 1 refills | Status: DC

## 2023-04-02 ENCOUNTER — Emergency Department (HOSPITAL_BASED_OUTPATIENT_CLINIC_OR_DEPARTMENT_OTHER)
Admission: EM | Admit: 2023-04-02 | Discharge: 2023-04-02 | Disposition: A | Discharge status: Home / Self Care | Payer: Medicare Other | Attending: Emergency Medicine | Admitting: Emergency Medicine

## 2023-04-02 ENCOUNTER — Other Ambulatory Visit: Payer: Self-pay

## 2023-04-02 ENCOUNTER — Emergency Department (HOSPITAL_BASED_OUTPATIENT_CLINIC_OR_DEPARTMENT_OTHER): Payer: Medicare Other | Admitting: Radiology

## 2023-04-02 ENCOUNTER — Encounter (HOSPITAL_BASED_OUTPATIENT_CLINIC_OR_DEPARTMENT_OTHER): Payer: Self-pay

## 2023-04-02 DIAGNOSIS — W1789XA Other fall from one level to another, initial encounter: Secondary | ICD-10-CM | POA: Insufficient documentation

## 2023-04-02 DIAGNOSIS — Z7901 Long term (current) use of anticoagulants: Secondary | ICD-10-CM | POA: Diagnosis not present

## 2023-04-02 DIAGNOSIS — S2241XA Multiple fractures of ribs, right side, initial encounter for closed fracture: Secondary | ICD-10-CM | POA: Insufficient documentation

## 2023-04-02 DIAGNOSIS — R0789 Other chest pain: Secondary | ICD-10-CM | POA: Diagnosis not present

## 2023-04-02 MED ORDER — ACETAMINOPHEN 500 MG PO TABS: 500.0000 mg | ORAL_TABLET | Freq: Four times a day (QID) | ORAL | 0 refills | Status: AC | PRN

## 2023-04-02 MED ORDER — TIZANIDINE HCL 2 MG PO CAPS: 2.0000 mg | ORAL_CAPSULE | Freq: Three times a day (TID) | ORAL | 0 refills | Status: DC | PRN

## 2023-04-02 NOTE — ED Triage Notes (Signed)
Patient here POV from Home.  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 Fell onto her Right Torso/Flank.   No Head injury. No LOC. Does Take Eliquis.   NAD Noted during Triage. A&Ox4. GCS 15. Ambulatory.

## 2023-04-02 NOTE — Discharge Instructions (Signed)
Take Tylenol for pain control.  Muscle relaxant should also be utilized if needed.  Make sure you are taking deep breaths to avoid/prevent complications like pneumonia.

## 2023-04-02 NOTE — ED Provider Notes (Signed)
[place] [facility]ERGENCY DEPARTMENT AT [facility] Provider Note   CSN: [id number] Arrival date & time: 04/02/23  1045     History  Chief Complaint  Patient presents with   Paige Levy    Paige Levy is a 82 y.o. female.  HPI    82 year old female comes in with chief complaint of fall.  Patient states that she fell about 3 or 4 days ago while she was out of town.  She was opening a truck door, lost her balance and fell onto grass.  She was fine that day, however subsequently she is started appreciating some pain over the right lower chest region.  Pain is worse with any kind of movement.  Patient does take Eliquis, but she does not think she struck her head.  She is denying any headaches, nausea, vision change.  Home Medications Prior to Admission medications   Medication Sig Start Date End Date Taking? Authorizing Provider  acetaminophen (TYLENOL) 500 MG tablet Take 1 tablet (500 mg total) by mouth every 6 (six) hours as needed. 04/02/23  Yes Derwood Kaplan, MD  tizanidine (ZANAFLEX) 2 MG capsule Take 1 capsule (2 mg total) by mouth 3 (three) times daily as needed for muscle spasms. 04/02/23  Yes Derwood Kaplan, MD  amiodarone (PACERONE) 200 MG tablet Take 1 tablet (200 mg total) by mouth daily. 07/19/22   Jodelle Red, MD  diltiazem (CARDIZEM) 30 MG tablet TAKE 1 TABLET DAILY AS NEEDED FOR HEART RATE ABOVE 120 BEATS PER MINUTE (RAPID ATRIAL FIBRILLATION) 07/30/22   Jodelle Red, MD  ELIQUIS 5 MG TABS tablet TAKE 1 TABLET BY MOUTH TWICE A DAY 02/06/23   Jodelle Red, MD  levothyroxine (SYNTHROID) 88 MCG tablet TAKE 1 TABLET BY MOUTH EVERY DAY BEFORE BREAKFAST 02/27/23   Allwardt, Alyssa M, PA-C  metoprolol succinate (TOPROL-XL) 25 MG 24 hr tablet TAKE 1 TABLET (25 MG TOTAL) BY MOUTH DAILY. 02/25/23   Jodelle Red, MD  Multiple Vitamin (MULTIVITAMIN WITH MINERALS) TABS tablet Take 2 tablets by mouth daily. Gummies    [provider]   olopatadine (PATADAY) 0.1 % ophthalmic solution Place 1 drop into both eyes daily as needed for allergies.    [provider]  Polyethyl Glycol-Propyl Glycol (SYSTANE OP) Place 1 drop into both eyes 2 (two) times daily as needed (dry eyes).    [provider]  Potassium Gluconate 550 MG TABS Take 550 mg by mouth daily.    [provider]  rosuvastatin (CRESTOR) 10 MG tablet TAKE 1 TABLET BY MOUTH EVERY DAY 12/20/22   Allwardt, Alyssa M, PA-C  triamterene-hydrochlorothiazide (MAXZIDE-25) 37.5-25 MG tablet TAKE 1 TABLET BY MOUTH EVERY DAY 11/05/22   Allwardt, Alyssa M, PA-C      Allergies    Ursodiol    Review of Systems   Review of Systems  All other systems reviewed and are negative.   Physical Exam Updated Vital Signs BP (!) 130/58   Pulse (!) 50   Temp 98.3 F (36.8 C)   Resp 16   Ht 5\' 4"  (1.626 m)   Wt 70.7 kg   SpO2 99%   BMI 26.75 kg/m  Physical Exam Vitals and nursing note reviewed.  Constitutional:      Appearance: She is well-developed.  HENT:     Head: Atraumatic.  Eyes:     Extraocular Movements: Extraocular movements intact.     Pupils: Pupils are equal, round, and reactive to light.  Cardiovascular:     Rate and Rhythm: Normal  rate.  Pulmonary:     Effort: Pulmonary effort is normal.  Musculoskeletal:     Cervical back: Normal range of motion and neck supple.     Comments: Patient history reasonable chest wall tenderness over the right lower thoracic region, posterior laterally.  Skin:    General: Skin is warm and dry.  Neurological:     Mental Status: She is alert and oriented to person, place, and time.     ED Results / Procedures / Treatments   Labs (all labs ordered are listed, but only abnormal results are displayed) Labs Reviewed - No data to display  EKG None  Radiology DG Ribs Unilateral W/Chest Right  Result Date: 04/02/2023 CLINICAL DATA:  Right lower chest wall pain.  Fall 3-4 days ago. EXAM: RIGHT RIBS  AND CHEST - 3+ VIEW COMPARISON:  02/15/2022.  CT and chest radiographs. FINDINGS: Subtle nondisplaced fractures of the right anterior ninth and tenth ribs. No other fractures.  No bone lesions. Cardiac silhouette normal in size. No mediastinal or hilar masses. Clear lungs. No pleural effusion or pneumothorax. IMPRESSION: 1. Subtle nondisplaced fractures of the anterior ninth and tenth rib ends. 2. No acute cardiopulmonary disease. Electronically Signed   By: Amie Portland M.D.   On: 04/02/2023 11:54    Procedures Procedures    Medications Ordered in ED Medications - No data to display  ED Course/ Medical Decision Making/ A&P                             Medical Decision Making Amount and/or Complexity of Data Reviewed Radiology: ordered.  Risk OTC drugs. Prescription drug management.   81 year old female comes in with chief complaint of right-sided thoracic pain.  She had a mechanical fall 4 days ago.  She is on blood thinners.  Differential diagnosis considered for this patient includes traumatic subdural hematoma, pneumothorax, musculoskeletal pain, rib contusion and rib fractures.  Patient has no red flags suggesting elevated ICP and she has no headaches at all.  She is 4 days out of the injury, with low suspicion for elevated ICP I do not think CT scan of the brain is indicated.  Brain and C-spine have been cleared clinically in the ER.  Patient has no signs of any bruising over the chest, but we proceeded with x-ray of the chest.  X-ray of the chest independently interpreted.  There is no evidence of pneumothorax, but there is clear evidence of broken ribs.  Results of the ER workup discussed with the patient.  She feels quite comfortable unless she is moving.  She does not want any strong pain medication.  We recommended that she take Tylenol around-the-clock and Zanaflex as needed.  She will also ensure that she is taking deep breaths.  Return precautions discussed.  Final  Clinical Impression(s) / ED Diagnoses Final diagnoses:  Closed fracture of multiple ribs of right side, initial encounter    Rx / DC Orders ED Discharge Orders          Ordered    acetaminophen (TYLENOL) 500 MG tablet  Every 6 hours PRN        04/02/23 1404    tizanidine (ZANAFLEX) 2 MG capsule  3 times daily PRN        04/02/23 1410              Derwood Kaplan, MD 04/02/23 1502

## 2023-04-04 ENCOUNTER — Telehealth: Payer: Self-pay

## 2023-04-04 NOTE — Transitions of Care (Post Inpatient/ED Visit) (Signed)
   04/04/2023  Name: Paige Levy MRN: [id number] DOB: 1941-08-14  Today's TOC FU Call Status:    Transition Care Management Follow-up Telephone Call Date of Discharge: 04/02/23 Discharge Facility: [facility] ([facility]) Type of Discharge: Emergency Department Reason for ED Visit: Other: How have you been since you were released from the hospital?: Same Any questions or concerns?: No  Items Reviewed: Did you receive and understand the discharge instructions provided?: Yes Medications obtained and verified?: Yes (Medications Reviewed) Any new allergies since your discharge?: No Dietary orders reviewed?: NA Do you have support at home?: Yes People in Home: spouse  Home Care and Equipment/Supplies: Were Home Health Services Ordered?: NA Any new equipment or medical supplies ordered?: NA  Functional Questionnaire: Do you need assistance with bathing/showering or dressing?: No Do you need assistance with meal preparation?: No Do you need assistance with eating?: No Do you have difficulty maintaining continence: No Do you need assistance with getting out of bed/getting out of a chair/moving?: No Do you have difficulty managing or taking your medications?: No  Follow up appointments reviewed: PCP Follow-up appointment confirmed?: Yes Date of PCP follow-up appointment?: 04/09/23 Follow-up Provider: Ila Mcgill, PA Specialist Hospital Follow-up appointment confirmed?: NA Do you need transportation to your follow-up appointment?: No Do you understand care options if your condition(s) worsen?: Yes-patient verbalized understanding    SIGNATURE Adela Glimpse, CMA

## 2023-04-09 ENCOUNTER — Ambulatory Visit (INDEPENDENT_AMBULATORY_CARE_PROVIDER_SITE_OTHER): Payer: Medicare Other | Admitting: Physician Assistant

## 2023-04-09 ENCOUNTER — Encounter: Payer: Self-pay | Admitting: Physician Assistant

## 2023-04-09 VITALS — BP 130/70 | HR 58 | Temp 97.3°F | Ht 64.0 in | Wt 153.6 lb

## 2023-04-09 DIAGNOSIS — S2241XD Multiple fractures of ribs, right side, subsequent encounter for fracture with routine healing: Secondary | ICD-10-CM

## 2023-04-09 DIAGNOSIS — Z780 Asymptomatic menopausal state: Secondary | ICD-10-CM

## 2023-04-09 NOTE — Progress Notes (Signed)
Subjective:    Patient ID: Paige Levy, female    DOB: [birth year], 82 y.o.   MRN: [id number]  Chief Complaint  Patient presents with   Follow-up    Pt in the office for ED follow up; pt says some pain on right side but generally just aware of it not really bad and has been manageable; pt says she didn't fall she slipped on wet grass getting in the truck, she still took dog to dog park;     HPI Patient is in today for ED f/up from 04/02/23. Slipped and fell onto wet grass, had pain on R side the next day. Found to have nondisplaced closed fractures of 9th and 10th ribs. Taking Tylenol one to two tabs daily as needed for pain. No SOB or CP. No other concerns today. No hx of any other fractures.   Past Medical History:  Diagnosis Date   Arthritis    Atrial fibrillation    Hyperlipemia    Hypothyroidism    Meniere's disease    Thyroid disease     Past Surgical History:  Procedure Laterality Date   ABDOMINAL HYSTERECTOMY     CARDIOVERSION N/A 03/21/2021   Procedure: CARDIOVERSION;  Surgeon: Thurmon Fair, MD;  Location: [facility] ENDOSCOPY;  Service: Cardiovascular;  Laterality: N/A;   CARDIOVERSION N/A 07/05/2022   Procedure: CARDIOVERSION;  Surgeon: Jodelle Red, MD;  Location: [facility] ENDOSCOPY;  Service: Cardiovascular;  Laterality: N/A;   ERCP Left 10/03/2014   Procedure: ENDOSCOPIC RETROGRADE CHOLANGIOPANCREATOGRAPHY (ERCP);  Surgeon: Barrie Folk, MD;  Location: [facility] ENDOSCOPY;  Service: Gastroenterology;  Laterality: Left;   ERCP N/A 01/14/2015   Procedure: ENDOSCOPIC RETROGRADE CHOLANGIOPANCREATOGRAPHY (ERCP);  Surgeon: Petra Kuba, MD;  Location: [facility] ENDOSCOPY;  Service: Endoscopy;  Laterality: N/A;  need lithotripter/ordered/LH   SPHINCTEROTOMY  09/2014   SPYGLASS LITHOTRIPSY N/A 01/14/2015   Procedure: [id number] LITHOTRIPSY;  Surgeon: Petra Kuba, MD;  Location: [facility] ENDOSCOPY;  Service: Endoscopy;  Laterality: N/A;    Family History  Problem Relation Age of Onset    Other Mother    Heart attack Mother    Other Father     Social History   Tobacco Use   Smoking status: Never   Smokeless tobacco: Never  Substance Use Topics   Alcohol use: No   Drug use: No     Allergies  Allergen Reactions   Ursodiol Other (See Comments)    Severe Dizziness    Review of Systems NEGATIVE UNLESS OTHERWISE INDICATED IN HPI      Objective:     BP 130/70 (BP Location: Left Arm)   Pulse (!) 58   Temp (!) 97.3 F (36.3 C) (Temporal)   Ht  (1.626 m)   Wt 153 lb 9.6 oz (69.7 kg)   SpO2 96%   BMI 26.37 kg/m   Wt Readings from Last 3 Encounters:  04/09/23 153 lb 9.6 oz (69.7 kg)  04/02/23 155 lb 13.8 oz (70.7 kg)  12/27/22 155 lb 12.8 oz (70.7 kg)    BP Readings from Last 3 Encounters:  04/09/23 130/70  04/02/23 (!) 130/58  12/27/22 132/68     Physical Exam Vitals and nursing note reviewed.  Constitutional:      Appearance: Normal appearance.  Cardiovascular:     Rate and Rhythm: Normal rate and regular rhythm.  Pulmonary:     Effort: Pulmonary effort is normal.     Breath sounds: Normal breath sounds.  Musculoskeletal:  General: Tenderness (some TTP R 9th and 10th ribs, no bruising or edema noted) present.  Neurological:     General: No focal deficit present.     Mental Status: She is alert.  Psychiatric:        Mood and Affect: Mood normal.        Behavior: Behavior normal.        Assessment & Plan:  Closed fracture of multiple ribs of right side with routine healing, subsequent encounter -     DG Bone Density; Future  Postmenopausal -     DG Bone Density; Future    I personally reviewed ED report and imaging. Healing well, no red flags. Expect full recovery in 6-8 weeks. She will continue Tylenol 500 mg 1-2 tab po prn pain. Heating pad, small pillow for comfort. Will plan to update DEXA scan as she is due for this; no hx osteoporosis. Fall precautions discussed.     Return in about 5 months (around 09/09/2023)  for regular follow-up, labs if needed.    TRUE Garciamartinez M Ryszard Socarras, PA-C

## 2023-04-29 ENCOUNTER — Telehealth: Payer: Self-pay | Admitting: Cardiology

## 2023-04-29 NOTE — Telephone Encounter (Signed)
I would stop the metoprolol and track her heart rates/symptoms to see if she feels better.

## 2023-04-29 NOTE — Telephone Encounter (Signed)
Returned call to patient's husband. Provided the following recommendations, "I would stop the metoprolol and track her heart rates/symptoms to see if she feels better."   Medication list updated, patient or husband will call back next week if not feeling better.

## 2023-04-29 NOTE — Telephone Encounter (Signed)
Pt spouse called in to inform Dr. Cristal Deer that the pt's hr has been averaging in the 50's and has gotten down to the 40's. He states pt has been a little disoriented, fatigue, and weak. Please advise.

## 2023-04-29 NOTE — Telephone Encounter (Signed)
Returned call to patient's husband- ok per dpr-  He states that her heart rate has been going down, staying around in the 50's. They have it seen is drop into the 40s a number of times. She endorses feeling tired, dizzy. He states this been happening since around the middle of last week. Nothing makes it better or worse. No changes to medications or illness patterns. She did fall about 6 weeks ago and fractured 2 ribs, but mostly feels better from that. She has not lost any weight.

## 2023-04-30 ENCOUNTER — Other Ambulatory Visit: Payer: Self-pay | Admitting: Physician Assistant

## 2023-04-30 ENCOUNTER — Telehealth (HOSPITAL_BASED_OUTPATIENT_CLINIC_OR_DEPARTMENT_OTHER): Payer: Self-pay | Admitting: Cardiology

## 2023-04-30 NOTE — Telephone Encounter (Signed)
Pt c/o medication issue:  1. Name of Medication:   metoprolol succinate (TOPROL-XL) 50 MG 24 hr tablet   2. How are you currently taking this medication (dosage and times per day)?   25 mg once a day  3. Are you having a reaction (difficulty breathing--STAT)?   No  4. What is your medication issue?    Husband stated patient stopped taking this medication yesterday due to another concern.  Husband stated he tried to get a refill and the are filling the prescription at a higher dose.  Husband states he wants this strength and frequency removed from patient's prescription list.

## 2023-04-30 NOTE — Telephone Encounter (Signed)
Returned call to patient,   He states he was going to ahead and refill the medication just in case she needed it again. Advised him that they do not need to get refill at this time, if we decide to put her back on the medications we will send in a new script. Patient's husbands states he will call pharmacy and let them know to remove it from her list.

## 2023-05-02 NOTE — Telephone Encounter (Signed)
57 - Wednesday  63 - yesterday  128 - this afternoon   Pt spouse calling back stating pt still in afib and wants to know if she should restart metoprolol. Please advise.

## 2023-05-02 NOTE — Telephone Encounter (Addendum)
Earlier today felt good then started feeling weak and broke out into a cold sweat Checked HR and was 128 blood pressure 144/89 Feeling washed out now, blood pressure 127/76 HR 68 Did not need to take the Cardizem, came down on its own Metoprolol was d/c secondary to low HR and has increased since then   Discussed with Dr Cristal Deer who suggested using the Diltiazem as needed like prescribed   Advised husband, verbalized understanding

## 2023-05-12 ENCOUNTER — Other Ambulatory Visit (HOSPITAL_BASED_OUTPATIENT_CLINIC_OR_DEPARTMENT_OTHER): Payer: Self-pay | Admitting: Cardiology

## 2023-05-13 NOTE — Telephone Encounter (Signed)
Rx(s) sent to pharmacy electronically.  

## 2023-06-03 ENCOUNTER — Other Ambulatory Visit: Payer: Self-pay | Admitting: Family

## 2023-06-03 DIAGNOSIS — I480 Paroxysmal atrial fibrillation: Secondary | ICD-10-CM

## 2023-06-06 ENCOUNTER — Other Ambulatory Visit (HOSPITAL_BASED_OUTPATIENT_CLINIC_OR_DEPARTMENT_OTHER): Payer: Self-pay | Admitting: Cardiology

## 2023-06-06 NOTE — Telephone Encounter (Signed)
Rx request sent to pharmacy.  

## 2023-06-07 ENCOUNTER — Encounter: Payer: Self-pay | Admitting: Physician Assistant

## 2023-06-07 ENCOUNTER — Ambulatory Visit (INDEPENDENT_AMBULATORY_CARE_PROVIDER_SITE_OTHER): Payer: Medicare Other | Admitting: Physician Assistant

## 2023-06-07 VITALS — BP 124/70 | HR 66 | Temp 97.8°F | Ht 64.0 in | Wt 154.8 lb

## 2023-06-07 DIAGNOSIS — M79604 Pain in right leg: Secondary | ICD-10-CM

## 2023-06-07 DIAGNOSIS — M79605 Pain in left leg: Secondary | ICD-10-CM

## 2023-06-07 LAB — MAGNESIUM: Magnesium: 1.9 mg/dL (ref 1.5–2.5)

## 2023-06-07 LAB — CBC WITH DIFFERENTIAL/PLATELET
Basophils Absolute: 0 10*3/uL (ref 0.0–0.1)
Basophils Relative: 0.6 % (ref 0.0–3.0)
Eosinophils Absolute: 0 10*3/uL (ref 0.0–0.7)
Eosinophils Relative: 1 % (ref 0.0–5.0)
HCT: 42.1 % (ref 36.0–46.0)
Hemoglobin: 13.9 g/dL (ref 12.0–15.0)
Lymphocytes Relative: 27.2 % (ref 12.0–46.0)
Lymphs Abs: 1.3 10*3/uL (ref 0.7–4.0)
MCHC: 33 g/dL (ref 30.0–36.0)
MCV: 95.8 fl (ref 78.0–100.0)
Monocytes Absolute: 0.3 10*3/uL (ref 0.1–1.0)
Monocytes Relative: 7 % (ref 3.0–12.0)
Neutro Abs: 3 10*3/uL (ref 1.4–7.7)
Neutrophils Relative %: 64.2 % (ref 43.0–77.0)
Platelets: 208 10*3/uL (ref 150.0–400.0)
RBC: 4.39 Mil/uL (ref 3.87–5.11)
RDW: 14 % (ref 11.5–15.5)
WBC: 4.7 10*3/uL (ref 4.0–10.5)

## 2023-06-07 LAB — COMPREHENSIVE METABOLIC PANEL
ALT: 21 U/L (ref 0–35)
AST: 27 U/L (ref 0–37)
Albumin: 4 g/dL (ref 3.5–5.2)
Alkaline Phosphatase: 52 U/L (ref 39–117)
BUN: 17 mg/dL (ref 6–23)
CO2: 32 mEq/L (ref 19–32)
Calcium: 9.1 mg/dL (ref 8.4–10.5)
Chloride: 100 mEq/L (ref 96–112)
Creatinine, Ser: 1.04 mg/dL (ref 0.40–1.20)
GFR: 49.63 mL/min — ABNORMAL LOW (ref >60.00)
Glucose, Bld: 85 mg/dL (ref 70–99)
Potassium: 3.8 mEq/L (ref 3.5–5.1)
Sodium: 140 mEq/L (ref 135–145)
Total Bilirubin: 1.2 mg/dL (ref 0.2–1.2)
Total Protein: 6.5 g/dL (ref 6.0–8.3)

## 2023-06-07 LAB — FERRITIN: Ferritin: 64.6 ng/mL (ref 10.0–291.0)

## 2023-06-07 NOTE — Patient Instructions (Addendum)
Good to see you today! Let's check labs and make sure no underlying issues contributing to leg pain.  Most likely Crestor is contributing - I would stop this medication for a few weeks and see if pain resolves.  You may take Tylenol Arthritis three times daily. You may do warm soaks / heating pad.  Let me know if any sudden worse or changes.

## 2023-06-07 NOTE — Progress Notes (Signed)
Subjective:    Patient ID: Paige Levy, female    DOB: 1941-08-28, 82 y.o.   MRN: [id number]  Chief Complaint  Patient presents with   Persistent leg pain    HPI Patient is in today for leg pains for the last week or so.  Aching in legs - all day / all night. Knee-down. Doesn't stop ADLs. Doesn't seem to be worse with walking. Tylenol helps for a short time. No tingling feeling. No swelling. Worried it may be a medicine causing her symptoms.   Past Medical History:  Diagnosis Date   Arthritis    Atrial fibrillation (HCC)    Hyperlipemia    Hypothyroidism    Meniere's disease    Thyroid disease     Past Surgical History:  Procedure Laterality Date   ABDOMINAL HYSTERECTOMY     CARDIOVERSION N/A 03/21/2021   Procedure: CARDIOVERSION;  Surgeon: Thurmon Fair, MD;  Location: [facility] ENDOSCOPY;  Service: Cardiovascular;  Laterality: N/A;   CARDIOVERSION N/A 07/05/2022   Procedure: CARDIOVERSION;  Surgeon: Jodelle Red, MD;  Location: [facility] ENDOSCOPY;  Service: Cardiovascular;  Laterality: N/A;   ERCP Left 10/03/2014   Procedure: ENDOSCOPIC RETROGRADE CHOLANGIOPANCREATOGRAPHY (ERCP);  Surgeon: Barrie Folk, MD;  Location: [facility] ENDOSCOPY;  Service: Gastroenterology;  Laterality: Left;   ERCP N/A 01/14/2015   Procedure: ENDOSCOPIC RETROGRADE CHOLANGIOPANCREATOGRAPHY (ERCP);  Surgeon: Petra Kuba, MD;  Location: [facility] ENDOSCOPY;  Service: Endoscopy;  Laterality: N/A;  need lithotripter/ordered/LH   SPHINCTEROTOMY  09/2014   SPYGLASS LITHOTRIPSY N/A 01/14/2015   Procedure: [id number] LITHOTRIPSY;  Surgeon: Petra Kuba, MD;  Location: [facility] ENDOSCOPY;  Service: Endoscopy;  Laterality: N/A;    Family History  Problem Relation Age of Onset   Other Mother    Heart attack Mother    Other Father     Social History   Tobacco Use   Smoking status: Never   Smokeless tobacco: Never  Substance Use Topics   Alcohol use: No   Drug use: No     Allergies  Allergen Reactions   Ursodiol  Other (See Comments)    Severe Dizziness    Review of Systems NEGATIVE UNLESS OTHERWISE INDICATED IN HPI      Objective:     BP 124/70 (BP Location: Right Arm, Patient Position: Sitting)   Pulse 66   Temp 97.8 F (36.6 C) (Temporal)   Ht 5\' 4"  (1.626 m)   Wt 154 lb 12.8 oz (70.2 kg)   SpO2 96%   BMI 26.57 kg/m   Wt Readings from Last 3 Encounters:  06/07/23 154 lb 12.8 oz (70.2 kg)  04/09/23 153 lb 9.6 oz (69.7 kg)  04/02/23 155 lb 13.8 oz (70.7 kg)    BP Readings from Last 3 Encounters:  06/07/23 124/70  04/09/23 130/70  04/02/23 (!) 130/58     Physical Exam Vitals and nursing note reviewed.  Constitutional:      Appearance: Normal appearance.  Cardiovascular:     Rate and Rhythm: Normal rate and regular rhythm.  Pulmonary:     Effort: Pulmonary effort is normal.     Breath sounds: Normal breath sounds.  Musculoskeletal:     Right lower leg: No edema.     Left lower leg: No edema.     Comments: N/V intact lower legs. Varicose veins noted bilateral legs.  Normal pulses, normal ROM.  Neurological:     Mental Status: She is alert.  Psychiatric:        Mood and Affect: Mood  normal.        Assessment & Plan:  Bilateral leg pain -     CBC with Differential/Platelet -     Comprehensive metabolic panel -     Magnesium -     Ferritin  No red flags on exam. Ddx including arthritis, varicose veins, statin myopathy, ferritin or Mg deficiency, neuropathy, to name a few. Will check labs today. Stop Crestor. Keep hydrated. Tylenol Arthritis, warm soaks. Call sooner if worse / any changes. Pt agreeable with plan.     Return in about 4 weeks (around 07/05/2023) for recheck/follow-up.   Josephanthony Tindel M Marice Angelino, PA-C

## 2023-06-21 ENCOUNTER — Encounter (HOSPITAL_BASED_OUTPATIENT_CLINIC_OR_DEPARTMENT_OTHER): Payer: Self-pay | Admitting: Cardiology

## 2023-06-21 ENCOUNTER — Ambulatory Visit (HOSPITAL_BASED_OUTPATIENT_CLINIC_OR_DEPARTMENT_OTHER): Payer: Medicare Other | Admitting: Cardiology

## 2023-06-21 VITALS — BP 126/78 | HR 61 | Ht 64.0 in | Wt 154.0 lb

## 2023-06-21 DIAGNOSIS — E7800 Pure hypercholesterolemia, unspecified: Secondary | ICD-10-CM | POA: Diagnosis not present

## 2023-06-21 DIAGNOSIS — I10 Essential (primary) hypertension: Secondary | ICD-10-CM

## 2023-06-21 DIAGNOSIS — I480 Paroxysmal atrial fibrillation: Secondary | ICD-10-CM | POA: Diagnosis not present

## 2023-06-21 DIAGNOSIS — Z7901 Long term (current) use of anticoagulants: Secondary | ICD-10-CM

## 2023-06-21 DIAGNOSIS — G720 Drug-induced myopathy: Secondary | ICD-10-CM | POA: Diagnosis not present

## 2023-06-21 DIAGNOSIS — D6859 Other primary thrombophilia: Secondary | ICD-10-CM

## 2023-06-21 DIAGNOSIS — T466X5A Adverse effect of antihyperlipidemic and antiarteriosclerotic drugs, initial encounter: Secondary | ICD-10-CM | POA: Diagnosis not present

## 2023-06-21 NOTE — Patient Instructions (Signed)
Medication Instructions:  Your physician recommends that you continue on your current medications as directed. Please refer to the Current Medication list given to you today.  *If you need a refill on your cardiac medications before your next appointment, please call your pharmacy*  Follow-Up: At [facility], you and your health needs are our priority.  As part of our continuing mission to provide you with exceptional heart care, we have created designated Provider Care Teams.  These Care Teams include your primary Cardiologist (physician) and Advanced Practice Providers (APPs -  Physician Assistants and Nurse Practitioners) who all work together to provide you with the care you need, when you need it.  Your next appointment:   6 month(s)  Provider:   Bridgette Christopher, MD     

## 2023-06-21 NOTE — Progress Notes (Unsigned)
Cardiology Office Note:  .   Date:  06/21/2023  ID:  Paige Levy, DOB [birth year], MRN [id number] PCP: Allwardt, Crist Infante, PA-C  [facility] Providers Cardiologist:  Jodelle Red, MD {  History of Present Illness: .   Paige Levy is a 82 y.o. female with a hx of paroxysmal atrial fibrillation, hypothyroidism, hyperlipidemia, hypertension who is seen for follow up today. I initially met her 02/27/21 as a new consult for the evaluation and management of atrial fibrillation.   Today: Overall doing well. Has some mild bruising. Had bilateral leg pain knees to feet, not related to exertion, ached all day/night. Somewhere better after stopping rosuvastatin though not completely. Has been off for about two weeks. Rare stinging sensation, but usually more of an ache. Able to sleep better at night, walk the dog twice a day now.  Brings list of home blood pressures, well controlled.  ROS: Denies chest pain, shortness of breath at rest or with normal exertion. No PND, orthopnea, LE edema or unexpected weight gain. No syncope or palpitations. ROS otherwise negative except as noted.   Studies Reviewed: Marland Kitchen    EKG:  EKG Interpretation Date/Time:  Friday June 21 2023 11:01:20 EDT Ventricular Rate:  63 PR Interval:  188 QRS Duration:  76 QT Interval:  444 QTC Calculation: 454 R Axis:   10  Text Interpretation: Normal sinus rhythm Normal ECG When compared with ECG of 05-Jul-2022 12:14, No significant change was found Confirmed by Jodelle Red [id number]) on 06/21/2023 11:28:42 AM    Physical Exam:   VS:  BP 126/78   Pulse 61   Ht 5\' 4"  (1.626 m)   Wt 154 lb (69.9 kg)   SpO2 98%   BMI 26.43 kg/m    Wt Readings from Last 3 Encounters:  06/21/23 154 lb (69.9 kg)  06/07/23 154 lb 12.8 oz (70.2 kg)  04/09/23 153 lb 9.6 oz (69.7 kg)    GEN: Well nourished, well developed in no acute distress HEENT: Normal, moist mucous membranes NECK: No JVD CARDIAC: regular  rhythm, normal S1 and S2, no rubs or gallops. 1/6 systolic murmur. VASCULAR: Radial and DP pulses 2+ bilaterally. No carotid bruits RESPIRATORY:  Clear to auscultation without rales, wheezing or rhonchi  ABDOMEN: Soft, non-tender, non-distended MUSCULOSKELETAL:  Ambulates independently SKIN: Warm and dry, no edema NEUROLOGIC:  Alert and oriented x 3. No focal neuro deficits noted. PSYCHIATRIC:  Normal affect    ASSESSMENT AND PLAN: .   atrial fibrillation, paroxysmal: -CHA2DS2/VAS Stroke Risk Points=3 -continue apixaban 5 mg BID (only meets age criteria for reduction, not weight/Cr) -echo unremarkable -holding sinus with amiodarone -metoprolol stopped for HR 49. Had brief elevated HR for a few days, now averaging around 60 bpm   Hypertension:  -at goal today -has history of hypokalemia. May need to change from triamterene-HCTZ if K is difficult to manage. Stable, last 3.8.   Hypercholesterolemia: -has been on rosuvastatin for a long time, but recently developed muscle aches. Slowly improving with stopping rosuvastatin. Given age, ok not to retrial statin  CV risk counseling and prevention -recommend heart healthy/Mediterranean diet, with whole grains, fruits, vegetable, fish, lean meats, nuts, and olive oil. Limit salt. -recommend moderate walking, 3-5 times/week for 30-50 minutes each session. Aim for at least 150 minutes.week. Goal should be pace of 3 miles/hours, or walking 1.5 miles in 30 minutes -recommend avoidance of tobacco products. Avoid excess alcohol.  Dispo: 6 mos  Signed, Jodelle Red, MD   Jodelle Red, MD,  PhD, [facility]  [facility]  [place]  [facility] at [facility] at [facility] [address] [place], [place] [address] [phone]

## 2023-07-02 ENCOUNTER — Ambulatory Visit: Payer: Medicare Other | Admitting: Family Medicine

## 2023-07-02 ENCOUNTER — Encounter: Payer: Self-pay | Admitting: Family Medicine

## 2023-07-02 VITALS — BP 134/72 | HR 66 | Temp 98.5°F | Resp 12 | Ht 64.0 in | Wt 156.4 lb

## 2023-07-02 DIAGNOSIS — S40812A Abrasion of left upper arm, initial encounter: Secondary | ICD-10-CM

## 2023-07-02 MED ORDER — DOXYCYCLINE HYCLATE 100 MG PO TABS
100.0000 mg | ORAL_TABLET | Freq: Two times a day (BID) | ORAL | 0 refills | Status: AC
Start: 2023-07-02 — End: ?

## 2023-07-02 NOTE — Progress Notes (Signed)
Established Patient Office Visit  Subjective   Patient ID: Paige Levy, female    DOB: 1941-02-01  Age: 82 y.o. MRN: [id number]  Chief Complaint  Patient presents with   skinned left forearm    HPI Discussed the use of AI scribe software for clinical note transcription with the patient, who gave verbal consent to proceed.  History of Present Illness   The patient presents with a hand injury sustained yesterday when they tripped and fell at a park. They describe the sensation of rolling on something, which caused them to lose balance and fall, hitting a tree in the process. The injury is located on the hand and has been bleeding since the incident. The patient reports soreness at the site of the injury but denies pain elsewhere in the arm. They are able to squeeze the doctor's fingers without discomfort, suggesting no significant damage to the hand's musculature or skeletal structure. The patient has a pet dog, a [employer], which was present during the incident but did not contribute to the fall.      Patient Active Problem List   Diagnosis Date Noted   Neoplasm of uncertain behavior of skin 08/28/2022   Secondary hypercoagulable state (HCC) 03/29/2021   Current use of long term anticoagulation 03/29/2021   Essential hypertension 03/29/2021   Pure hypercholesterolemia 03/29/2021   Atrial fibrillation (HCC) 02/08/2021   Chronic rhinitis 04/27/2016   Hearing loss 04/27/2016   Active cochlear Meniere's disease, bilateral 01/17/2015   Meniere's disease 10/01/2014   Dyslipidemia 10/01/2014   Hypothyroidism 11/09/2013   Osteoarthritis 11/09/2013   Past Medical History:  Diagnosis Date   Arthritis    Atrial fibrillation (HCC)    Hyperlipemia    Hypothyroidism    Meniere's disease    Thyroid disease    Past Surgical History:  Procedure Laterality Date   ABDOMINAL HYSTERECTOMY     CARDIOVERSION N/A 03/21/2021   Procedure: CARDIOVERSION;  Surgeon: Thurmon Fair, MD;   Location: [facility] ENDOSCOPY;  Service: Cardiovascular;  Laterality: N/A;   CARDIOVERSION N/A 07/05/2022   Procedure: CARDIOVERSION;  Surgeon: Jodelle Red, MD;  Location: [facility] ENDOSCOPY;  Service: Cardiovascular;  Laterality: N/A;   ERCP Left 10/03/2014   Procedure: ENDOSCOPIC RETROGRADE CHOLANGIOPANCREATOGRAPHY (ERCP);  Surgeon: Barrie Folk, MD;  Location: [facility] ENDOSCOPY;  Service: Gastroenterology;  Laterality: Left;   ERCP N/A 01/14/2015   Procedure: ENDOSCOPIC RETROGRADE CHOLANGIOPANCREATOGRAPHY (ERCP);  Surgeon: Petra Kuba, MD;  Location: [facility] ENDOSCOPY;  Service: Endoscopy;  Laterality: N/A;  need lithotripter/ordered/LH   SPHINCTEROTOMY  09/2014   SPYGLASS LITHOTRIPSY N/A 01/14/2015   Procedure: [id number] LITHOTRIPSY;  Surgeon: Petra Kuba, MD;  Location[facility] END[facility]COPY;  Service: Endoscopy;  Laterality: N/A;   Social History   Tobacco Use   Smoking status: Never   Smokeless tobacco: Never  Substance Use Topics   Alcohol use: No   Drug use: No   Social History   Socioeconomic History   Marital status: Married    Spouse name: Not on file   Number of children: Not on file   Years of education: Not on file   Highest education level: Not on file  Occupational History   Not on file  Tobacco Use   Smoking status: Never   Smokeless tobacco: Never  Substance and Sexual Activity   Alcohol use: No   Drug use: No   Sexual activity: Never  Other Topics Concern   Not on file  Social History Narrative   Not on file  Social Determinants of Health   Financial Resource Strain: Low Risk  (11/12/2022)   Overall Financial Resource Strain (CARDIA)    Difficulty of Paying Living Expenses: Not hard at all  Food Insecurity: No Food Insecurity (11/12/2022)   Hunger Vital Sign    Worried About Running Out of Food in the Last Year: Never true    Ran Out of Food in the Last Year: Never true  Transportation Needs: No Transportation Needs (11/12/2022)   PRAPARE - Scientist, research (physical sciences) (Medical): No    Lack of Transportation (Non-Medical): No  Physical Activity: Sufficiently Active (11/12/2022)   Exercise Vital Sign    Days of Exercise per Week: 7 days    Minutes of Exercise per Session: 30 min  Stress: No Stress Concern Present (11/12/2022)   [employer] of Occupational Health - Occupational Stress Questionnaire    Feeling of Stress : Not at all  Social Connections: Moderately Integrated (11/12/2022)   Social Connection and Isolation Panel [NHANES]    Frequency of Communication with Friends and Family: Once a week    Frequency of Social Gatherings with Friends and Family: More than three times a week    Attends Religious Services: 1 to 4 times per year    Active Member of [employer] or Organizations: No    Attends Banker Meetings: Never    Marital Status: Married  Catering manager Violence: Not At Risk (11/12/2022)   Humiliation, Afraid, Rape, and Kick questionnaire    Fear of Current or Ex-Partner: No    Emotionally Abused: No    Physically Abused: No    Sexually Abused: No   Family Status  Relation Name Status   Mother  Deceased   Father  Deceased   Family History  Problem Relation Age of Onset   Other Mother    Heart attack Mother    Other Father    Allergies  Allergen Reactions   Ursodiol Other (See Comments)    Severe Dizziness   Rosuvastatin Other (See Comments)    Leg pain      Review of Systems  Constitutional:  Negative for chills, fever and malaise/fatigue.  HENT:  Negative for congestion and hearing loss.   Eyes:  Negative for discharge.  Respiratory:  Negative for cough, sputum production and shortness of breath.   Cardiovascular:  Negative for chest pain, palpitations and leg swelling.  Gastrointestinal:  Negative for abdominal pain, blood in stool, constipation, diarrhea, heartburn, nausea and vomiting.  Genitourinary:  Negative for dysuria, frequency, hematuria and urgency.  Musculoskeletal:  Negative  for back pain, falls and myalgias.  Skin:  Positive for itching and rash.  Neurological:  Negative for dizziness, sensory change, loss of consciousness, weakness and headaches.  Endo/Heme/Allergies:  Negative for environmental allergies. Does not bruise/bleed easily.  Psychiatric/Behavioral:  Negative for depression and suicidal ideas. The patient is not nervous/anxious and does not have insomnia.       Objective:     BP 134/72 (BP Location: Right Arm, Cuff Size: Normal)   Pulse 66   Temp 98.5 F (36.9 C) (Oral)   Resp 12   Ht 5\' 4"  (1.626 m)   Wt 156 lb 6.4 oz (70.9 kg)   SpO2 98%   BMI 26.85 kg/m  BP Readings from Last 3 Encounters:  07/02/23 134/72  06/21/23 126/78  06/07/23 124/70   Wt Readings from Last 3 Encounters:  07/02/23 156 lb 6.4 oz (70.9 kg)  06/21/23 154 lb (69.9  kg)  06/07/23 154 lb 12.8 oz (70.2 kg)   SpO2 Readings from Last 3 Encounters:  07/02/23 98%  06/21/23 98%  06/07/23 96%      Physical Exam Vitals and nursing note reviewed.  Constitutional:      General: She is not in acute distress.    Appearance: Normal appearance. She is well-developed.  HENT:     Head: Normocephalic and atraumatic.  Eyes:     General: No scleral icterus.       Right eye: No discharge.        Left eye: No discharge.  Cardiovascular:     Rate and Rhythm: Normal rate and regular rhythm.     Heart sounds: No murmur heard. Pulmonary:     Effort: Pulmonary effort is normal. No respiratory distress.     Breath sounds: Normal breath sounds.  Musculoskeletal:        General: Normal range of motion.     Cervical back: Normal range of motion and neck supple.     Right lower leg: No edema.     Left lower leg: No edema.  Skin:    General: Skin is warm and dry.     Findings: Erythema and rash present. Rash is vesicular.  Neurological:     Mental Status: She is alert and oriented to person, place, and time.  Psychiatric:        Mood and Affect: Mood normal.         Behavior: Behavior normal.        Thought Content: Thought content normal.        Judgment: Judgment normal.      No results found for any visits on 07/02/23.    The ASCVD Risk score (Arnett DK, et al., 2019) failed to calculate for the following reasons:   The 2019 ASCVD risk score is only valid for ages 20 to 73    Assessment & Plan:   Problem List Items Addressed This Visit   None Visit Diagnoses     Abrasion of left arm, initial encounter    -  Primary   Relevant Medications   doxycycline (VIBRA-TABS) 100 MG tablet      Assessment and Plan    Left Arm Laceration: Sustained from a fall yesterday, no signs of fracture. Localized soreness and bleeding. No known allergies to antibiotics. -Clean and dress wound. -Prescribe antibiotic for infection prevention. -Update tetanus vaccination due to nature of injury.  Follow-up: -Change dressing daily and maintain cleanliness. -If redness around wound worsens, see primary care provider before the weekend. -Primary care provider to check wound in 1-2 weeks.       No follow-ups on file.    Donato Schultz, DO

## 2023-07-10 ENCOUNTER — Ambulatory Visit: Payer: Medicare Other | Admitting: Physician Assistant

## 2023-07-10 ENCOUNTER — Encounter: Payer: Self-pay | Admitting: Physician Assistant

## 2023-07-10 VITALS — BP 137/74 | HR 65 | Ht 65.0 in | Wt 154.6 lb

## 2023-07-10 DIAGNOSIS — S40812S Abrasion of left upper arm, sequela: Secondary | ICD-10-CM | POA: Diagnosis not present

## 2023-07-10 DIAGNOSIS — S40812D Abrasion of left upper arm, subsequent encounter: Secondary | ICD-10-CM | POA: Diagnosis not present

## 2023-07-10 NOTE — Patient Instructions (Addendum)
Good to see you today! Appears to be healing well. You can try Manuka honey non-adhesive pads (find over the counter at [facility]) Or try Vaseline, then non-stick, then wrap.  Wash gently at least once daily, pat dry.   Consider wound care services if not improving in next few weeks.

## 2023-07-10 NOTE — Progress Notes (Signed)
Subjective:    Patient ID: Paige Levy, female    DOB: 1941-10-23, 82 y.o.   MRN: [id number]  Chief Complaint  Patient presents with   Follow-up    Follow up after abrasion, patient stated that she feels its healing, Wound is still weeping, red and open, still painful, no ointment, keeping clean and covered     HPI Patient is in today for f/up abrasion left arm from 07/02/23. Here with her husband today. She has been using non-stick pads and wrapping with ace bandage. Washing daily, still having pain if touching it, otherwise not red or any issues.  Past Medical History:  Diagnosis Date   Arthritis    Atrial fibrillation (HCC)    Hyperlipemia    Hypothyroidism    Meniere's disease    Thyroid disease     Past Surgical History:  Procedure Laterality Date   ABDOMINAL HYSTERECTOMY     CARDIOVERSION N/A 03/21/2021   Procedure: CARDIOVERSION;  Surgeon: Thurmon Fair, MD;  Location: [facility] ENDOSCOPY;  Service: Cardiovascular;  Laterality: N/A;   CARDIOVERSION N/A 07/05/2022   Procedure: CARDIOVERSION;  Surgeon: Jodelle Red, MD;  Location: [facility] ENDOSCOPY;  Service: Cardiovascular;  Laterality: N/A;   ERCP Left 10/03/2014   Procedure: ENDOSCOPIC RETROGRADE CHOLANGIOPANCREATOGRAPHY (ERCP);  Surgeon: Barrie Folk, MD;  Location: [facility] ENDOSCOPY;  Service: Gastroenterology;  Laterality: Left;   ERCP N/A 01/14/2015   Procedure: ENDOSCOPIC RETROGRADE CHOLANGIOPANCREATOGRAPHY (ERCP);  Surgeon: Petra Kuba, MD;  Location: [facility] ENDOSCOPY;  Service: Endoscopy;  Laterality: N/A;  need lithotripter/ordered/LH   SPHINCTEROTOMY  09/2014   SPYGLASS LITHOTRIPSY N/A 01/14/2015   Procedure: HQIONGEX LITHOTRIPSY;  Surgeon: Petra Kuba, MD;  Location: [facility] ENDOSCOPY;  Service: Endoscopy;  Laterality: N/A;    Family History  Problem Relation Age of Onset   Other Mother    Heart attack Mother    Other Father     Social History   Tobacco Use   Smoking status: Never   Smokeless tobacco: Never   Substance Use Topics   Alcohol use: No   Drug use: No     Allergies  Allergen Reactions   Ursodiol Other (See Comments)    Severe Dizziness   Rosuvastatin Other (See Comments)    Leg pain    Review of Systems NEGATIVE UNLESS OTHERWISE INDICATED IN HPI      Objective:     BP 137/74 (BP Location: Right Arm, Patient Position: Sitting)   Pulse 65   Ht 5\' 5"  (1.651 m)   Wt 154 lb 9.6 oz (70.1 kg)   SpO2 95%   BMI 25.73 kg/m   Wt Readings from Last 3 Encounters:  07/10/23 154 lb 9.6 oz (70.1 kg)  07/02/23 156 lb 6.4 oz (70.9 kg)  06/21/23 154 lb (69.9 kg)    BP Readings from Last 3 Encounters:  07/10/23 137/74  07/02/23 134/72  06/21/23 126/78     Physical Exam  Left forearm large healing abrasion with no surrounding erythema or edema, see photo below:      Assessment & Plan:  Abrasion of left arm, sequela  -Reviewed previous note -Healing well, no surrounding infection -Try Manuka honey non-adhesive pads (find over the counter at [facility]) Or try Vaseline, then non-stick, then wrap.  Wash gently at least once daily, pat dry.   Consider wound care services if not improving in next few weeks.      Return if symptoms worsen or fail to improve.    Doctor Sheahan M Senai Ramnath, PA-C

## 2023-07-22 ENCOUNTER — Telehealth: Payer: Self-pay | Admitting: Physician Assistant

## 2023-07-22 NOTE — Telephone Encounter (Signed)
Pt wants a TOC from Alyssa to Dr Salomon Fick - to be in the same office as her husband. Please advise.

## 2023-07-22 NOTE — Telephone Encounter (Signed)
error 

## 2023-07-25 ENCOUNTER — Encounter (HOSPITAL_BASED_OUTPATIENT_CLINIC_OR_DEPARTMENT_OTHER): Payer: Self-pay | Admitting: Emergency Medicine

## 2023-07-25 ENCOUNTER — Other Ambulatory Visit: Payer: Self-pay

## 2023-07-25 ENCOUNTER — Emergency Department (HOSPITAL_BASED_OUTPATIENT_CLINIC_OR_DEPARTMENT_OTHER)
Admission: EM | Admit: 2023-07-25 | Discharge: 2023-07-25 | Disposition: A | Discharge status: Home / Self Care | Payer: Medicare Other | Attending: Emergency Medicine | Admitting: Emergency Medicine

## 2023-07-25 ENCOUNTER — Emergency Department (HOSPITAL_BASED_OUTPATIENT_CLINIC_OR_DEPARTMENT_OTHER): Payer: Medicare Other

## 2023-07-25 DIAGNOSIS — M79605 Pain in left leg: Secondary | ICD-10-CM | POA: Diagnosis not present

## 2023-07-25 DIAGNOSIS — M7122 Synovial cyst of popliteal space [Baker], left knee: Secondary | ICD-10-CM | POA: Diagnosis not present

## 2023-07-25 DIAGNOSIS — Z7901 Long term (current) use of anticoagulants: Secondary | ICD-10-CM | POA: Diagnosis not present

## 2023-07-25 DIAGNOSIS — M79662 Pain in left lower leg: Secondary | ICD-10-CM | POA: Diagnosis not present

## 2023-07-25 DIAGNOSIS — I4891 Unspecified atrial fibrillation: Secondary | ICD-10-CM | POA: Diagnosis not present

## 2023-07-25 LAB — BASIC METABOLIC PANEL
Anion gap: 7 (ref 5–15)
BUN: 14 mg/dL (ref 8–23)
CO2: 31 mmol/L (ref 22–32)
Calcium: 8.8 mg/dL — ABNORMAL LOW (ref 8.9–10.3)
Chloride: 101 mmol/L (ref 98–111)
Creatinine, Ser: 1.03 mg/dL — ABNORMAL HIGH (ref 0.44–1.00)
GFR, Estimated: 54 mL/min — ABNORMAL LOW (ref >60)
Glucose, Bld: 89 mg/dL (ref 70–99)
Potassium: 4.1 mmol/L (ref 3.5–5.1)
Sodium: 139 mmol/L (ref 135–145)

## 2023-07-25 LAB — CBC WITH DIFFERENTIAL/PLATELET
Abs Immature Granulocytes: 0.05 10*3/uL (ref 0.00–0.07)
Basophils Absolute: 0 10*3/uL (ref 0.0–0.1)
Basophils Relative: 1 %
Eosinophils Absolute: 0 10*3/uL (ref 0.0–0.5)
Eosinophils Relative: 1 %
HCT: 41.7 % (ref 36.0–46.0)
Hemoglobin: 13.9 g/dL (ref 12.0–15.0)
Immature Granulocytes: 1 %
Lymphocytes Relative: 26 %
Lymphs Abs: 1.4 10*3/uL (ref 0.7–4.0)
MCH: 32 pg (ref 26.0–34.0)
MCHC: 33.3 g/dL (ref 30.0–36.0)
MCV: 96.1 fL (ref 80.0–100.0)
Monocytes Absolute: 0.4 10*3/uL (ref 0.1–1.0)
Monocytes Relative: 7 %
Neutro Abs: 3.4 10*3/uL (ref 1.7–7.7)
Neutrophils Relative %: 64 %
Platelets: 203 10*3/uL (ref 150–400)
RBC: 4.34 MIL/uL (ref 3.87–5.11)
RDW: 13.2 % (ref 11.5–15.5)
WBC: 5.2 10*3/uL (ref 4.0–10.5)
nRBC: 0 % (ref 0.0–0.2)

## 2023-07-25 MED ORDER — KETOROLAC TROMETHAMINE 15 MG/ML IJ SOLN
15.0000 mg | Freq: Once | INTRAMUSCULAR | Status: DC
  Filled 2023-07-25: qty 1

## 2023-07-25 MED ORDER — ACETAMINOPHEN 325 MG PO TABS
650.0000 mg | ORAL_TABLET | Freq: Once | ORAL | Status: DC
  Filled 2023-07-25: qty 2

## 2023-07-25 NOTE — ED Triage Notes (Signed)
Patient c/o left calf pain x 3 weeks, worse over the past week.  Patient denies trauma.

## 2023-07-25 NOTE — Discharge Instructions (Signed)
As we discussed, I would like for you to take Tylenol for conservative pain medication to start and follow-up with your primary care doctor.  If pain continues to get worse she may return to the emergency room and we can do some more testing.

## 2023-07-25 NOTE — ED Provider Notes (Signed)
[place] EMERGENCY DEPARTMENT AT [facility] Provider Note   CSN: [id number] Arrival date & time: 07/25/23  1207     History Chief Complaint  Patient presents with   Leg Pain    Paige Levy is a 82 y.o. female patient with history of atrial fibrillation on Eliquis who presents to the emerged from today with a left leg pain this been ongoing for the last week.  Patient states that she suffers from lateral leg pain times time which he attributes to age but really the last week the left leg started getting worse and more consistent.  She denies any trauma or injury to the left leg.  She describes as a sore sensation.  She denies any weakness or numbness to the leg.  No chest pain or shortness of breath.   Leg Pain      Home Medications Prior to Admission medications   Medication Sig Start Date End Date Taking? Authorizing Provider  acetaminophen (TYLENOL) 500 MG tablet Take 1 tablet (500 mg total) by mouth every 6 (six) hours as needed. 04/02/23   Derwood Kaplan, MD  amiodarone (PACERONE) 200 MG tablet Take 1 tablet (200 mg total) by mouth daily. 06/04/23   Jodelle Red, MD  diltiazem (CARDIZEM) 30 MG tablet TAKE 1 TABLET DAILY AS NEEDED FOR HEART RATE ABOVE 120 BEATS PER MINUTE (RAPID ATRIAL FIBRILLATION) 06/06/23   Jodelle Red, MD  doxycycline (VIBRA-TABS) 100 MG tablet Take 1 tablet (100 mg total) by mouth 2 (two) times daily. 07/02/23   Lowne Chase, Yvonne R, DO  ELIQUIS 5 MG TABS tablet TAKE 1 TABLET BY MOUTH TWICE A DAY 02/06/23   Jodelle Red, MD  levothyroxine (SYNTHROID) 88 MCG tablet TAKE 1 TABLET BY MOUTH EVERY DAY BEFORE BREAKFAST 02/27/23   Allwardt, Crist Infante, PA-C  Multiple Vitamin (MULTIVITAMIN WITH MINERALS) TABS tablet Take 2 tablets by mouth daily. Gummies    [provider]  olopatadine (PATADAY) 0.1 % ophthalmic solution Place 1 drop into both eyes daily as needed for allergies.    [provider]  Polyethyl  Glycol-Propyl Glycol (SYSTANE OP) Place 1 drop into both eyes 2 (two) times daily as needed (dry eyes).    [provider]  Potassium Gluconate 550 MG TABS Take 550 mg by mouth daily.    [provider]  tizanidine (ZANAFLEX) 2 MG capsule Take 1 capsule (2 mg total) by mouth 3 (three) times daily as needed for muscle spasms. 04/02/23   Derwood Kaplan, MD  triamterene-hydrochlorothiazide (MAXZIDE-25) 37.5-25 MG tablet TAKE 1 TABLET BY MOUTH EVERY DAY 04/30/23   Allwardt, Alyssa M, PA-C      Allergies    Ursodiol and Rosuvastatin    Review of Systems   Review of Systems  All other systems reviewed and are negative.   Physical Exam Updated Vital Signs BP 133/63   Pulse (!) 57   Temp (!) 97.3 F (36.3 C) (Temporal)   Resp 16   Ht 5\' 6"  (1.676 m)   Wt 71 kg   SpO2 100%   BMI 25.26 kg/m  Physical Exam Vitals and nursing note reviewed.  Constitutional:      Appearance: Normal appearance.  HENT:     Head: Normocephalic and atraumatic.  Eyes:     General:        Right eye: No discharge.        Left eye: No discharge.     Conjunctiva/sclera: Conjunctivae normal.  Pulmonary:     Effort: Pulmonary  effort is normal.  Musculoskeletal:     Comments: Right leg is normal.  There is 2+ dorsalis pedis pulse felt on the left and right legs.  There is no significant erythema or rash.  Left leg appears normal in size in comparison to the right.  No significant calf tenderness.  Skin:    General: Skin is warm and dry.     Findings: No rash.  Neurological:     General: No focal deficit present.     Mental Status: She is alert.  Psychiatric:        Mood and Affect: Mood normal.        Behavior: Behavior normal.     ED Results / Procedures / Treatments   Labs (all labs ordered are listed, but only abnormal results are displayed) Labs Reviewed  BASIC METABOLIC PANEL - Abnormal; Notable for the following components:      Result Value   Creatinine, Ser 1.03 (*)     Calcium 8.8 (*)    GFR, Estimated 54 (*)    All other components within normal limits  CBC WITH DIFFERENTIAL/PLATELET    EKG None  Radiology US Venous Img Lower Unilateral Left  Result Date: 07/25/2023 CLINICAL DATA:  Left calf pain. EXAM: LEFT LOWER EXTREMITY VENOUS DOPPLER ULTRASOUND TECHNIQUE: Gray-scale sonography with graded compression, as well as color Doppler and duplex ultrasound were performed to evaluate the lower extremity deep venous systems from the level of the common femoral vein and including the common femoral, femoral, profunda femoral, popliteal and calf veins including the posterior tibial, peroneal and gastrocnemius veins when visible. The superficial great saphenous vein was also interrogated. Spectral Doppler was utilized to evaluate flow at rest and with distal augmentation maneuvers in the common femoral, femoral and popliteal veins. COMPARISON:  None Available. FINDINGS: Contralateral Common Femoral Vein: Respiratory phasicity is normal and symmetric with the symptomatic side. No evidence of thrombus. Normal compressibility. Common Femoral Vein: No evidence of thrombus. Normal compressibility, respiratory phasicity and response to augmentation. Saphenofemoral Junction: No evidence of thrombus. Normal compressibility and flow on color Doppler imaging. Profunda Femoral Vein: No evidence of thrombus. Normal compressibility and flow on color Doppler imaging. Femoral Vein: No evidence of thrombus. Normal compressibility, respiratory phasicity and response to augmentation. Popliteal Vein: No evidence of thrombus. Normal compressibility, respiratory phasicity and response to augmentation. Calf Veins: No evidence of thrombus. Normal compressibility and flow on color Doppler imaging. Superficial Great Saphenous Vein: No evidence of thrombus. Normal compressibility. Venous Reflux:  None. Other Findings: Small Baker's cyst in the left popliteal fossa measures approximately 2.0 x 1.0 x 1.4  cm. No evidence of superficial thrombophlebitis. IMPRESSION: 1. No evidence of left lower extremity deep venous thrombosis. 2. Small Baker's cyst in the left popliteal fossa. Electronically Signed   By: Irish Lack M.D.   On: 07/25/2023 15:17    Procedures Procedures    Medications Ordered in ED Medications  acetaminophen (TYLENOL) tablet 650 mg (has no administration in time range)    ED Course/ Medical Decision Making/ A&P Clinical Course as of 07/25/23 1553  Thu Jul 25, 2023  1520 US Venous Img Lower Unilateral Left No evidence of DVT. [CF]  1520 CBC with Differential Normal.  [CF]  1520 Basic metabolic panel(!) Slightly elevated creatine. Otherwise normal.  [CF]  1546 On reevaluation, I dorsiflex the foot on the left leg which reproduces some of the pain around the calf.  Patient does have a Baker's cyst which was seen on ultrasound  which could be related.  Patient has not taken any pain medication up to this point.  I want to start her on Tylenol she cannot take any NSAIDs secondary to Eliquis and she will follow-up with her primary care doctor. [CF]    Clinical Course User Index [CF] Teressa Lower, PA-C   {   Click here for ABCD2, HEART and other calculators  Medical Decision Making LANIA WENDLANDT is a 82 y.o. female patient who presents to the emergency department today for further evaluation of left leg pain.  Patient is on Eliquis but I am going to get a ultrasound of the left leg to rule out any DVT.  There is no evidence of trauma or cellulitic changes on the leg.  I will low suspicion for pulmonary embolism as well.  Vital signs are normal apart from some slightly high blood pressure.  Will treat conservative for Tylenol for now ultrasound was negative for DVT. Strict return precautions given. I have a low suspicion for any vascular process. Likely muscular.  I will have her follow-up with her primary care doctor.  She is safe for discharge.  Amount and/or  Complexity of Data Reviewed Labs: ordered. Decision-making details documented in ED Course. Radiology:  Decision-making details documented in ED Course.  Risk OTC drugs.    Final Clinical Impression(s) / ED Diagnoses Final diagnoses:  Left leg pain    Rx / DC Orders ED Discharge Orders     None         Jolyn Lent 07/25/23 1554    Terrilee Files, MD 07/25/23 1843

## 2023-07-26 NOTE — Telephone Encounter (Signed)
Ok

## 2023-07-30 ENCOUNTER — Other Ambulatory Visit: Payer: Self-pay | Admitting: Physician Assistant

## 2023-07-31 ENCOUNTER — Telehealth: Payer: Self-pay

## 2023-07-31 NOTE — Telephone Encounter (Signed)
Transition Care Management Unsuccessful Follow-up Telephone Call  Date of discharge and from where:  07/25/2023 [facility]  Attempts:  1st Attempt  Reason for unsuccessful TCM follow-up call:  Left voice message  Naysa Puskas [facility]  [facility] Population Health Community Resource Care Guide   [email or web address]  ?? [phone]   Website: [email or web address]

## 2023-08-01 ENCOUNTER — Telehealth: Payer: Self-pay

## 2023-08-01 NOTE — Telephone Encounter (Signed)
Transition Care Management Unsuccessful Follow-up Telephone Call  Date of discharge and from where:  07/25/2023 [facility]  Attempts:  2nd Attempt  Reason for unsuccessful TCM follow-up call:  Left voice message  Trellis Vanoverbeke [facility]  [facility] Population Health Community Resource Care Guide   [email or web address]  ?? [phone]   Website: [email or web address]  [email or web address]

## 2023-08-06 ENCOUNTER — Emergency Department (HOSPITAL_BASED_OUTPATIENT_CLINIC_OR_DEPARTMENT_OTHER): Payer: Medicare Other

## 2023-08-06 ENCOUNTER — Emergency Department (HOSPITAL_BASED_OUTPATIENT_CLINIC_OR_DEPARTMENT_OTHER)
Admission: EM | Admit: 2023-08-06 | Discharge: 2023-08-06 | Disposition: A | Discharge status: Home / Self Care | Payer: Medicare Other | Attending: Emergency Medicine | Admitting: Emergency Medicine

## 2023-08-06 ENCOUNTER — Other Ambulatory Visit: Payer: Self-pay

## 2023-08-06 DIAGNOSIS — M79604 Pain in right leg: Secondary | ICD-10-CM | POA: Insufficient documentation

## 2023-08-06 DIAGNOSIS — M79605 Pain in left leg: Secondary | ICD-10-CM | POA: Diagnosis not present

## 2023-08-06 DIAGNOSIS — M79662 Pain in left lower leg: Secondary | ICD-10-CM | POA: Diagnosis not present

## 2023-08-06 DIAGNOSIS — I4891 Unspecified atrial fibrillation: Secondary | ICD-10-CM | POA: Diagnosis not present

## 2023-08-06 DIAGNOSIS — Z7901 Long term (current) use of anticoagulants: Secondary | ICD-10-CM | POA: Insufficient documentation

## 2023-08-06 DIAGNOSIS — M79661 Pain in right lower leg: Secondary | ICD-10-CM | POA: Diagnosis not present

## 2023-08-06 NOTE — ED Triage Notes (Signed)
Bilateral leg pain. Pt states 3 weeks ago had left pain and had a bakers cyst. Pt states now having right leg pain from knee down. Pt denies any injury. Pt states went for a walk yesterday and pain got worse.

## 2023-08-06 NOTE — Discharge Instructions (Signed)
Please follow-up with your primary care provider regarding recent symptoms and ER visit.  Today your ultrasound was negative and your pain is most likely caused by your leg muscles.  You may ice your legs at the end of the day or use Tylenol every 6 hours needed for pain.  You may also use compression socks to help with any leg pain you may have.  If symptoms change or worsen please return to ER.

## 2023-08-06 NOTE — ED Notes (Signed)
 RN reviewed discharge instructions with pt. Pt verbalized understanding and had no further questions. VSS upon discharge.  

## 2023-08-06 NOTE — ED Provider Notes (Signed)
[place] EMERGENCY DEPARTMENT AT [facility] Provider Note   CSN: [id number] Arrival date & time: 08/06/23  1259     History  Chief Complaint  Patient presents with   Leg Pain    Paige Levy is a 82 y.o. female history of A-fib on Eliquis presented with bilateral leg pain for the past 3 weeks.  Patient was seen earlier this month with the same chief concern had ultrasound on the right leg that showed a Baker's cyst but otherwise was reassuring.  Patient states that since being discharged she has tried Tylenol but states that has not helped.  Patient states that her legs tend "ache "at the end of the day and notes that she is very active throughout the day with walking her dog and being up and around the house.  Patient denies skin color changes, or change in sensation/motor skills, gait abnormalities, fevers, recent travel/hospitalization/surgery.  Patient states that she is taking her Eliquis daily and has not missed any doses.  Patient states that since being discharged she has not had any changes in symptoms and has not been unable to see a primary care provider.   Home Medications Prior to Admission medications   Medication Sig Start Date End Date Taking? Authorizing Provider  acetaminophen (TYLENOL) 500 MG tablet Take 1 tablet (500 mg total) by mouth every 6 (six) hours as needed. 04/02/23   Derwood Kaplan, MD  amiodarone (PACERONE) 200 MG tablet Take 1 tablet (200 mg total) by mouth daily. 06/04/23   Jodelle Red, MD  diltiazem (CARDIZEM) 30 MG tablet TAKE 1 TABLET DAILY AS NEEDED FOR HEART RATE ABOVE 120 BEATS PER MINUTE (RAPID ATRIAL FIBRILLATION) 06/06/23   Jodelle Red, MD  doxycycline (VIBRA-TABS) 100 MG tablet Take 1 tablet (100 mg total) by mouth 2 (two) times daily. 07/02/23   Lowne Chase, Yvonne R, DO  ELIQUIS 5 MG TABS tablet TAKE 1 TABLET BY MOUTH TWICE A DAY 02/06/23   Jodelle Red, MD  levothyroxine (SYNTHROID) 88 MCG tablet TAKE 1  TABLET BY MOUTH EVERY DAY BEFORE BREAKFAST 07/30/23   Allwardt, Crist Infante, PA-C  Multiple Vitamin (MULTIVITAMIN WITH MINERALS) TABS tablet Take 2 tablets by mouth daily. Gummies    [provider]  olopatadine (PATADAY) 0.1 % ophthalmic solution Place 1 drop into both eyes daily as needed for allergies.    [provider]  Polyethyl Glycol-Propyl Glycol (SYSTANE OP) Place 1 drop into both eyes 2 (two) times daily as needed (dry eyes).    [provider]  Potassium Gluconate 550 MG TABS Take 550 mg by mouth daily.    [provider]  tizanidine (ZANAFLEX) 2 MG capsule Take 1 capsule (2 mg total) by mouth 3 (three) times daily as needed for muscle spasms. 04/02/23   Derwood Kaplan, MD  triamterene-hydrochlorothiazide (MAXZIDE-25) 37.5-25 MG tablet TAKE 1 TABLET BY MOUTH EVERY DAY 04/30/23   Allwardt, Crist Infante, PA-C      Allergies    Ursodiol and Rosuvastatin    Review of Systems   Review of Systems  Physical Exam Updated Vital Signs BP 139/68 (BP Location: Right Arm)   Pulse (!) 55   Temp 98.5 F (36.9 C)   Resp 16   SpO2 93%  Physical Exam Constitutional:      General: She is not in acute distress. Cardiovascular:     Rate and Rhythm: Normal rate.     Pulses: Normal pulses.     Comments: 2+ bilateral posterior tibialis pulses Musculoskeletal:  General: Normal range of motion.     Right lower leg: No edema.     Left lower leg: No edema.     Comments: Achiness in both legs however no tenderness to palpation and no abnormalities were palpated, no calf tenderness noted bilaterally 5 out of 5 bilateral knee extension, plantarflexion/dorsiflexion Legs appear equal in size bilaterally  Skin:    General: Skin is warm and dry.     Capillary Refill: Capillary refill takes less than 2 seconds.     Comments: No overlying skin color changes  Neurological:     Mental Status: She is alert.     Comments: Sensation intact distally     ED Results /  Procedures / Treatments   Labs (all labs ordered are listed, but only abnormal results are displayed) Labs Reviewed - No data to display  EKG None  Radiology US Venous Img Lower Right (DVT Study)  Result Date: 08/06/2023 CLINICAL DATA:  Calf pain, AFib EXAM: RIGHT LOWER EXTREMITY VENOUS DOPPLER ULTRASOUND TECHNIQUE: Gray-scale sonography with compression, as well as color and duplex ultrasound, were performed to evaluate the deep venous system(s) from the level of the common femoral vein through the popliteal and proximal calf veins. COMPARISON:  None Available. FINDINGS: VENOUS Normal compressibility of the common femoral, superficial femoral, and popliteal veins, as well as the visualized calf veins. Visualized portions of profunda femoral vein and great saphenous vein unremarkable. No filling defects to suggest DVT on grayscale or color Doppler imaging. Doppler waveforms show normal direction of venous flow, normal respiratory plasticity and response to augmentation. Limited views of the contralateral common femoral vein are unremarkable. OTHER None. Limitations: none IMPRESSION: Negative. Electronically Signed   By: Charline Bills M.D.   On: 08/06/2023 18:48    Procedures Procedures    Medications Ordered in ED Medications - No data to display  ED Course/ Medical Decision Making/ A&P                                 Medical Decision Making  Paige Levy 82 y.o. presented today for bilateral leg aching. Working DDx that I considered at this time includes, but not limited to, MSK, osteoarthritis, DVT, ischemic limb, neurovascular compromise, fracture.  R/o DDx: osteoarthritis, DVT, ischemic limb, neurovascular compromise, fracture: These are considered less likely due to history of present illness and physical exam findings  Review of prior external notes: 07/25/2023 ED  Unique Tests and My Interpretation:  Right leg ultrasound DVT: Negative  Discussion with Independent  Historian: None  Discussion of Management of Tests: None  Risk: Low: based on diagnostic testing/clinical impression and treatment plan  Risk Stratification Score: None  Plan: On exam patient was in no acute distress and resting comfortably with stable vitals.  Patient's physical exam was unremarkable and patient stated that she has had no changes in symptoms since being discharged at the beginning of the month.  At this time low suspicion of any vascular emergencies however they did not ultrasound her right leg at the previous visit and patient states that she was having achiness like that as well and so we will proceed with right leg ultrasound due to A-fib however low suspicion of DVT.  Anticipate discharge primary care follow-up.  I spoke to the patient about icing her legs at the other day as her leg achiness is most likely muscle skeletal in nature and that she can continue using  the Tylenol every 6 hours and that she may consider using compression socks to help with her leg symptoms.  Ultrasound negative.  Will discharge home with symptomatic management as listed above with primary care follow-up.  Patient was given return precautions. Patient stable for discharge at this time.  Patient verbalized understanding of plan.         Final Clinical Impression(s) / ED Diagnoses Final diagnoses:  Bilateral leg pain    Rx / DC Orders ED Discharge Orders     None         Remi Deter 08/06/23 Jonne Ply, MD 08/08/23 1335

## 2023-08-12 ENCOUNTER — Other Ambulatory Visit: Payer: Self-pay | Admitting: Cardiology

## 2023-08-12 ENCOUNTER — Other Ambulatory Visit: Payer: Self-pay | Admitting: Physician Assistant

## 2023-08-12 DIAGNOSIS — I4891 Unspecified atrial fibrillation: Secondary | ICD-10-CM

## 2023-08-12 NOTE — Telephone Encounter (Signed)
Please review for refill. Thank you! 

## 2023-08-12 NOTE — Telephone Encounter (Signed)
Eliquis 5mg  refill request received. Patient is 82 years old, weight-71kg, Crea-1.03 on 07/25/23, Diagnosis-Afib, and last seen by Dr. Cristal Deer on 06/21/23. Dose is appropriate based on dosing criteria. Will send in refill to requested pharmacy.

## 2023-08-14 ENCOUNTER — Telehealth: Payer: Self-pay

## 2023-08-14 NOTE — Telephone Encounter (Signed)
Transition Care Management Follow-up Telephone Call Date of discharge and from where: 08/06/2023 [facility] How have you been since you were released from the hospital? Patient is feeling better but her leg is still bothering her. Any questions or concerns? No  Items Reviewed: Did the pt receive and understand the discharge instructions provided? Yes  Medications obtained and verified?  No medication prescribed. OTC Tylenol. Other? No  Any new allergies since your discharge? No  Dietary orders reviewed? Yes Do you have support at home? Yes   Follow up appointments reviewed:  PCP Hospital f/u appt confirmed? No  Scheduled to see  on  @ . Specialist Hospital f/u appt confirmed? Yes  Scheduled to see Deeann Saint, MD on 08/22/2023 @ [facility] [place]. Are transportation arrangements needed? No  If their condition worsens, is the pt aware to call PCP or go to the Emergency Dept.? Yes Was the patient provided with contact information for the PCP's office or ED? Yes Was to pt encouraged to call back with questions or concerns? Yes  Zevin Nevares [facility]  [facility] Population Health Community Resource Care Guide   [email or web address]  ?? [phone]   Website: [email or web address]  [email or web address]

## 2023-08-15 ENCOUNTER — Telehealth (INDEPENDENT_AMBULATORY_CARE_PROVIDER_SITE_OTHER): Payer: Medicare Other | Admitting: Family Medicine

## 2023-08-15 ENCOUNTER — Encounter: Payer: Self-pay | Admitting: Family Medicine

## 2023-08-15 DIAGNOSIS — U071 COVID-19: Secondary | ICD-10-CM | POA: Diagnosis not present

## 2023-08-15 MED ORDER — MOLNUPIRAVIR EUA 200MG CAPSULE: 4.0000 | ORAL_CAPSULE | Freq: Two times a day (BID) | ORAL | 0 refills | Status: AC

## 2023-08-15 MED ORDER — BENZONATATE 100 MG PO CAPS: ORAL_CAPSULE | ORAL | 0 refills | Status: DC

## 2023-08-15 NOTE — Patient Instructions (Addendum)
HOME CARE TIPS:   -I sent the medication(s) we discussed to your pharmacy: Meds ordered this encounter  Medications   benzonatate (TESSALON PERLES) 100 MG capsule    Sig: 1-2 capsules up to twice daily as needed for cough.    Dispense:  30 capsule    Refill:  0   molnupiravir EUA (LAGEVRIO) 200 mg CAPS capsule    Sig: Take 4 capsules (800 mg total) by mouth 2 (two) times daily for 5 days.    Dispense:  40 capsule    Refill:  0     -I sent in the Covid19 treatment or referral you requested per our discussion. Please see the information provided below and discuss further with the pharmacist/treatment team.    -there is a chance of rebound illness with covid after improving. This can happen whether or not you take an antiviral treatment. If you become sick again with covid after getting better, please schedule a follow up virtual visit and isolate again.  -can use tylenol if needed for fevers, aches and pains per instructions  -nasal saline sinus rinses twice daily  -stay hydrated, drink plenty of fluids and eat small healthy meals - avoid dairy  -can take 1000 IU ( ) Vit D3 and 100-500 mg of Vit C daily per instructions  -follow up with your doctor in 2-3 days unless improving and feeling better  -stay home while sick, except to seek medical care. If you have COVID19, you will likely be contagious for 7-10 days. Flu or Influenza is likely contagious for about 7 days. Other respiratory viral infections remain contagious for 5-10+ days depending on the virus and many other factors. Wear a good mask that fits snugly (such as N95 or KN95) if around others to reduce the risk of transmission.  It was nice to meet you today, and I really hope you are feeling better soon. I help [facility] out with telemedicine visits on Tuesdays and Thursdays and am happy to help if you need a follow up virtual visit on those days. Otherwise, if you have any concerns or questions following this visit  please schedule a follow up visit with your Primary Care doctor or seek care at a local urgent care clinic to avoid delays in care.    Seek in person care or schedule a follow up video visit promptly if your symptoms worsen, new concerns arise or you are not improving with treatment. Call 911 and/or seek emergency care if your symptoms are severe or life threatening.  Molnupiravir Capsules What is this medication? MOLNUPIRAVIR (MOL nue PIR a vir) treats mild to moderate COVID-19. It may help people who are at high risk of developing severe illness. This medication works by limiting the spread of the virus in your body. The FDA has allowed the emergency use of this medication. This medicine may be used for other purposes; ask your health care provider or pharmacist if you have questions. COMMON BRAND NAME(S): LAGEVRIO What should I tell my care team before I take this medication? They need to know if you have any of these conditions: Any allergies Any serious illness An unusual or allergic reaction to molnupiravir, other medications, foods, dyes, or preservatives Pregnant or trying to get pregnant Breast-feeding How should I use this medication? Take this medication by mouth with water. Take it as directed on the prescription label at the same time every day. Do not cut, crush, or chew this medication. Swallow the capsules whole. You can take it with  or without food. If it upsets your stomach, take it with food. Take all of it unless your care team tells you to stop it early. Keep taking it even if you think you are better. Talk to your care team about the use of this medication in children. Special care may be needed. Overdosage: If you think you have taken too much of this medicine contact a poison control center or emergency room at once. NOTE: This medicine is only for you. Do not share this medicine with others. What if I miss a dose? If you miss a dose, take it as soon as you can unless  it is more than 10 hours late. If it is more than 10 hours late, skip the missed dose. Take the next dose at the normal time. Do not take extra or 2 doses at the same time to make up for the missed dose. What may interact with this medication? Interactions have not been studied. This list may not describe all possible interactions. Give your health care provider a list of all the medicines, herbs, non-prescription drugs, or dietary supplements you use. Also tell them if you smoke, drink alcohol, or use illegal drugs. Some items may interact with your medicine. What should I watch for while using this medication? Your condition will be monitored carefully while you are receiving this medication. Visit your care team for regular checkups. Tell your care team if your symptoms do not start to get better or if they get worse. Do not become pregnant while taking this medication. You may need a pregnancy test before starting this medication. Women must use a reliable form of birth control while taking this medication and for 4 days after stopping the medication. Women should inform their care team if they wish to become pregnant or think they might be pregnant. Men should not father a child while taking this medication and for 3 months after stopping it. There is potential for serious harm to an unborn child. Talk to your care team for more information. Do not breast-feed an infant while taking this medication and for 4 days after stopping the medication. What side effects may I notice from receiving this medication? Side effects that you should report to your care team as soon as possible: Allergic reactions--skin rash, itching, hives, swelling of the face, lips, tongue, or throat Side effects that usually do not require medical attention (report these to your care team if they continue or are bothersome): Diarrhea Dizziness Nausea This list may not describe all possible side effects. Call your doctor for  medical advice about side effects. You may report side effects to FDA at 1-800-FDA-1088. Where should I keep my medication? Keep out of the reach of children and pets. Store at room temperature between 20 and 25 degrees C (68 and 77 degrees F). Get rid of any unused medication after the expiration date. To get rid of medications that are no longer needed or have expired: Take the medication to a medication take-back program. Check with your pharmacy or law enforcement to find a location. If you cannot return the medication, check the label or package insert to see if the medication should be thrown out in the garbage or flushed down the toilet. If you are not sure, ask your care team. If it is safe to put it in the trash, take the medication out of the container. Mix the medication with cat litter, dirt, coffee grounds, or other unwanted substance. Seal the mixture in  a bag or container. Put it in the trash. NOTE: This sheet is a summary. It may not cover all possible information. If you have questions about this medicine, talk to your doctor, pharmacist, or health care provider.  2024 Elsevier/Gold Standard (2022-02-05 00:00:00)

## 2023-08-15 NOTE — Progress Notes (Signed)
Virtual Visit via Video Note  I connected with Paige Levy  on 08/15/23 at  5:40 PM EDT by a video enabled telemedicine application and verified that I am speaking with the correct person using two identifiers.  Location patient: [place] Location provider:work or home office Persons participating in the virtual visit: patient, provider, husband  I discussed the limitations and requested verbal permission for telemedicine visit. The patient expressed understanding and agreed to proceed.   HPI:  Acute telemedicine visit for Covid: -Onset: 3 days ago, tested positive for covid today -Symptoms include: nasal congestion, sore throat, cough, body aches -Denies:fever, CP, SOB, NVD -able to drink fluids -Has tried: tylenol -Pertinent past medical history: see below, GFR 54 in August, never had covid -Pertinent medication allergies: Allergies  Allergen Reactions   Ursodiol Other (See Comments)    Severe Dizziness   Rosuvastatin Other (See Comments)    Leg pain   -COVID-19 vaccine status:  last dose was in 2020 Immunization History  Administered Date(s) Administered   Influenza Split 10/31/2012, 11/09/2013   Influenza, High Dose Seasonal PF 10/19/2014, 10/26/2015, 10/30/2016, 10/07/2017, 09/30/2018, 11/18/2019, 10/25/2020, 11/16/2021   Influenza,inj,quad, With Preservative 11/09/2013   Influenza-Unspecified 10/19/2014   PFIZER(Purple Top)SARS-COV-2 Vaccination 12/20/2020   Pneumococcal Conjugate-13 10/30/2016   Pneumococcal Polysaccharide-23 08/16/2005   Td 09/04/2006   Td (Adult),5 Lf Tetanus Toxid, Preservative Free 09/04/2006     ROS: See pertinent positives and negatives per HPI.  Past Medical History:  Diagnosis Date   Arthritis    Atrial fibrillation (HCC)    Hyperlipemia    Hypothyroidism    Meniere's disease    Thyroid disease     Past Surgical History:  Procedure Laterality Date   ABDOMINAL HYSTERECTOMY     CARDIOVERSION N/A 03/21/2021   Procedure: CARDIOVERSION;   Surgeon: Thurmon Fair, MD;  Location: [facility] ENDOSCOPY;  Service: Cardiovascular;  Laterality: N/A;   CARDIOVERSION N/A 07/05/2022   Procedure: CARDIOVERSION;  Surgeon: Jodelle Red, MD;  Location: [facility] ENDOSCOPY;  Service: Cardiovascular;  Laterality: N/A;   ERCP Left 10/03/2014   Procedure: ENDOSCOPIC RETROGRADE CHOLANGIOPANCREATOGRAPHY (ERCP);  Surgeon: Barrie Folk, MD;  Location: [facility] ENDOSCOPY;  Service: Gastroenterology;  Laterality: Left;   ERCP N/A 01/14/2015   Procedure: ENDOSCOPIC RETROGRADE CHOLANGIOPANCREATOGRAPHY (ERCP);  Surgeon: Petra Kuba, MD;  Location: [facility] ENDOSCOPY;  Service: Endoscopy;  Laterality: N/A;  need lithotripter/ordered/LH   SPHINCTEROTOMY  09/2014   SPYGLASS LITHOTRIPSY N/A 01/14/2015   Procedure: ZOXWRUEA LITHOTRIPSY;  Surgeon: Petra Kuba, MD;  Location: [facility] ENDOSCOPY;  Service: Endoscopy;  Laterality: N/A;     Current Outpatient Medications:    acetaminophen (TYLENOL) 500 MG tablet, Take 1 tablet (500 mg total) by mouth every 6 (six) hours as needed., Disp: 30 tablet, Rfl: 0   amiodarone (PACERONE) 200 MG tablet, Take 1 tablet (200 mg total) by mouth daily., Disp: 90 tablet, Rfl: 3   benzonatate (TESSALON PERLES) 100 MG capsule, 1-2 capsules up to twice daily as needed for cough., Disp: 30 capsule, Rfl: 0   diltiazem (CARDIZEM) 30 MG tablet, TAKE 1 TABLET DAILY AS NEEDED FOR HEART RATE ABOVE 120 BEATS PER MINUTE (RAPID ATRIAL FIBRILLATION), Disp: 90 tablet, Rfl: 1   doxycycline (VIBRA-TABS) 100 MG tablet, Take 1 tablet (100 mg total) by mouth 2 (two) times daily., Disp: 20 tablet, Rfl: 0   ELIQUIS 5 MG TABS tablet, TAKE 1 TABLET BY MOUTH TWICE A DAY, Disp: 180 tablet, Rfl: 1   levothyroxine (SYNTHROID) 88 MCG tablet, TAKE 1 TABLET BY MOUTH EVERY  DAY BEFORE BREAKFAST, Disp: 90 tablet, Rfl: 1   molnupiravir EUA (LAGEVRIO) 200 mg CAPS capsule, Take 4 capsules (800 mg total) by mouth 2 (two) times daily for 5 days., Disp: 40 capsule, Rfl: 0   Multiple Vitamin  (MULTIVITAMIN WITH MINERALS) TABS tablet, Take 2 tablets by mouth daily. Gummies, Disp: , Rfl:    olopatadine (PATADAY) 0.1 % ophthalmic solution, Place 1 drop into both eyes daily as needed for allergies., Disp: , Rfl:    Polyethyl Glycol-Propyl Glycol (SYSTANE OP), Place 1 drop into both eyes 2 (two) times daily as needed (dry eyes)., Disp: , Rfl:    Potassium Gluconate 550 MG TABS, Take 550 mg by mouth daily., Disp: , Rfl:    tizanidine (ZANAFLEX) 2 MG capsule, Take 1 capsule (2 mg total) by mouth 3 (three) times daily as needed for muscle spasms., Disp: 15 capsule, Rfl: 0   triamterene-hydrochlorothiazide (MAXZIDE-25) 37.5-25 MG tablet, TAKE 1 TABLET BY MOUTH EVERY DAY, Disp: 90 tablet, Rfl: 1  EXAM:  VITALS per patient if applicable: none reported  GENERAL: alert, oriented, appears well and in no acute distress  HEENT: atraumatic, conjunttiva clear, no obvious abnormalities on inspection of external nose and ears  NECK: normal movements of the head and neck  LUNGS: on inspection no signs of respiratory distress, breathing rate appears normal, no obvious gross SOB, gasping or wheezing  CV: no obvious cyanosis  MS: moves all visible extremities without noticeable abnormality  PSYCH/NEURO: pleasant and cooperative, no obvious depression or anxiety, speech and thought processing grossly intact  ASSESSMENT AND PLAN:  Discussed the following assessment and plan:  COVID-19   Discussed treatment options, side effect and risk of drug interactions, ideal treatment window, potential complications, isolation and precautions for COVID-19.  Discussed possibility of rebound with or without antivirals. Checked for/reviewed last GFR - listed in HPI if available. After lengthy discussion, the patient opted for treatment with legevrio due to being higher risk for complications of covid or severe disease and other factors. Discussed common and serious potential side effects and that sometimes  insurance does not cover. She prefers to not take antiviral if not covered. The patient did want a prescription for cough, Tessalon Rx sent.  Other symptomatic care measures summarized in patient instructions.Discussed symptomatic care with nasal saline, salt water gargles, tylenol if needed.   Advised to seek prompt virtual visit or in person care if worsening, new symptoms arise, or if is not improving with treatment as expected per our conversation of expected course. Discussed options for follow up care. Did let this patient know that I do telemedicine on Tuesdays and Thursdays for [place] and those are the days I am logged into the system. Advised to schedule follow up visit with PCP, [place] virtual visits or [facility] if any further questions or concerns to avoid delays in care.   I discussed the assessment and treatment plan with the patient. The patient was provided an opportunity to ask questions and all were answered. The patient agreed with the plan and demonstrated an understanding of the instructions.     Terressa Koyanagi, DO

## 2023-08-16 NOTE — Telephone Encounter (Signed)
Pt's TOC has already been scheduled for 08/22/23.

## 2023-08-20 ENCOUNTER — Telehealth: Payer: Self-pay | Admitting: Family Medicine

## 2023-08-20 NOTE — Telephone Encounter (Signed)
Called patient spoke with her husband, advised patient that she is okay to come in today office she would need to wear a mask. The husband said that he also was pos, I told him that he would need to wear a mask also.

## 2023-08-20 NOTE — Telephone Encounter (Signed)
Pt tested positive for covid last week, started treatement 08/16/23, took home test this morning and is still positive. Has TOC with [facility] on 08/22/23, please advise patient as to how she should handle appointment on 08/22/23

## 2023-08-22 ENCOUNTER — Ambulatory Visit (INDEPENDENT_AMBULATORY_CARE_PROVIDER_SITE_OTHER): Payer: Medicare Other

## 2023-08-22 ENCOUNTER — Encounter: Payer: Self-pay | Admitting: Family Medicine

## 2023-08-22 ENCOUNTER — Ambulatory Visit (INDEPENDENT_AMBULATORY_CARE_PROVIDER_SITE_OTHER): Payer: Medicare Other | Admitting: Family Medicine

## 2023-08-22 VITALS — BP 108/72 | HR 48 | Temp 98.8°F | Ht 66.0 in | Wt 148.8 lb

## 2023-08-22 DIAGNOSIS — G9339 Other post infection and related fatigue syndromes: Secondary | ICD-10-CM

## 2023-08-22 DIAGNOSIS — R059 Cough, unspecified: Secondary | ICD-10-CM | POA: Diagnosis not present

## 2023-08-22 DIAGNOSIS — R051 Acute cough: Secondary | ICD-10-CM | POA: Diagnosis not present

## 2023-08-22 DIAGNOSIS — M7122 Synovial cyst of popliteal space [Baker], left knee: Secondary | ICD-10-CM

## 2023-08-22 DIAGNOSIS — U071 COVID-19: Secondary | ICD-10-CM | POA: Diagnosis not present

## 2023-08-22 NOTE — Progress Notes (Signed)
Established Patient Office Visit   Subjective  Patient ID: SAKARI HAVRILLA, female    DOB: 06/11/1941  Age: 82 y.o. MRN: [id number]  Chief Complaint  Patient presents with   Hospitalization Follow-up    Patient was oseen in the ED for left leg pain, started 4 weeks ago, patient states that the leg is still sore   Covid Positive    Patient had Covid 2 weeks ago, and patient is still having nasal drip, cough, sinus congestion   Pt accompanied by her husband.  Patient is an 82 year old female seen for TOC and follow-up on ongoing conditions.  Patient diagnosed with COVID 2 weeks ago.  Was given antiviral medication. Still having symptoms of fatigue, cough, achiness, decreased appetite.  Staying hydrated.  Concerned she is still testing positive.  Patient's husband was also sick with COVID but his symptoms have improved.  Patient seen in ED on 08/06/2023 for bilateral leg pain.  Imaging negative for DVT.  Baker's cyst noted on imaging in ED from 07/25/2023.      Past Medical History:  Diagnosis Date   Arthritis    Atrial fibrillation (HCC)    Hyperlipemia    Hypothyroidism    Meniere's disease    Thyroid disease    Past Surgical History:  Procedure Laterality Date   ABDOMINAL HYSTERECTOMY     CARDIOVERSION N/A 03/21/2021   Procedure: CARDIOVERSION;  Surgeon: Thurmon Fair, MD;  Location: [facility] ENDOSCOPY;  Service: Cardiovascular;  Laterality: N/A;   CARDIOVERSION N/A 07/05/2022   Procedure: CARDIOVERSION;  Surgeon: Jodelle Red, MD;  Location: [facility] ENDOSCOPY;  Service: Cardiovascular;  Laterality: N/A;   ERCP Left 10/03/2014   Procedure: ENDOSCOPIC RETROGRADE CHOLANGIOPANCREATOGRAPHY (ERCP);  Surgeon: Barrie Folk, MD;  Location: [facility] ENDOSCOPY;  Service: Gastroenterology;  Laterality: Left;   ERCP N/A 01/14/2015   Procedure: ENDOSCOPIC RETROGRADE CHOLANGIOPANCREATOGRAPHY (ERCP);  Surgeon: Petra Kuba, MD;  Location: [facility] ENDOSCOPY;  Service: Endoscopy;  Laterality: N/A;  need  lithotripter/ordered/LH   SPHINCTEROTOMY  09/2014   SPYGLASS LITHOTRIPSY N/A 01/14/2015   Procedure: NWGNFAOZ LITHOTRIPSY;  Surgeon: Petra Kuba, MD;  Location: [facility] ENDOSCOPY;  Service: Endoscopy;  Laterality: N/A;   Social History   Tobacco Use   Smoking status: Never   Smokeless tobacco: Never  Substance Use Topics   Alcohol use: No   Drug use: No   Family History  Problem Relation Age of Onset   Other Mother    Heart attack Mother    Other Father    Allergies  Allergen Reactions   Ursodiol Other (See Comments)    Severe Dizziness   Rosuvastatin Other (See Comments)    Leg pain    ROS Negative unless stated above    Objective:     BP 108/72 (BP Location: Left Arm, Patient Position: Sitting, Cuff Size: Normal)   Pulse (!) 48   Temp 98.8 F (37.1 C) (Oral)   Ht 5\' 6"  (1.676 m)   Wt 148 lb 12.8 oz (67.5 kg)   SpO2 97%   BMI 24.02 kg/m  BP Readings from Last 3 Encounters:  08/22/23 108/72  08/06/23 (!) 134/59  07/25/23 133/63   Wt Readings from Last 3 Encounters:  08/22/23 148 lb 12.8 oz (67.5 kg)  07/25/23 156 lb 8.4 oz (71 kg)  07/10/23 154 lb 9.6 oz (70.1 kg)      Physical Exam Constitutional:      General: She is not in acute distress.    Appearance: Normal appearance.  HENT:  Head: Normocephalic and atraumatic.     Nose: Nose normal.     Mouth/Throat:     Mouth: Mucous membranes are moist.  Cardiovascular:     Rate and Rhythm: Normal rate and regular rhythm.     Heart sounds: Normal heart sounds. No murmur heard.    No gallop.  Pulmonary:     Effort: Pulmonary effort is normal. No respiratory distress.     Breath sounds: Normal breath sounds. No wheezing, rhonchi or rales.     Comments: Mildly decreased breath sounds in right lower base. Skin:    General: Skin is warm and dry.     Comments: Varicose veins in bilateral LEs.  Neurological:     Mental Status: She is alert and oriented to person, place, and time.      No results found  for any visits on 08/22/23.    Assessment & Plan:  Acute cough -     DG Chest 2 View  COVID-19 virus infection -     DG Chest 2 View  Other post infection and related fatigue syndromes -     DG Chest 2 View  Baker's cyst of knee, left  Patient seen for TOC and follow-up on acute issues.  Continue viral URI symptoms status post COVID-19 infection diagnosis 2 weeks ago.  Will obtain x-ray to assess for pneumonia.  Continue supportive care.  Small Baker's cyst in left popliteal fossa noted on ultrasound 07/25/2023.  Discussed supportive care and treatment options.  For increased symptoms consider referral to Ortho.  Return if symptoms worsen or fail to improve.   Deeann Saint, MD

## 2023-08-22 NOTE — Patient Instructions (Signed)
It was nice meeting you today.  We will obtain an x-ray just to make sure you do not have any infection in your lungs that is causing your continued symptoms from COVID.  I have included some information about Baker's cyst.

## 2023-09-25 ENCOUNTER — Ambulatory Visit (INDEPENDENT_AMBULATORY_CARE_PROVIDER_SITE_OTHER): Payer: Medicare Other | Admitting: Family Medicine

## 2023-09-25 ENCOUNTER — Ambulatory Visit (INDEPENDENT_AMBULATORY_CARE_PROVIDER_SITE_OTHER): Payer: Medicare Other

## 2023-09-25 ENCOUNTER — Encounter: Payer: Self-pay | Admitting: Family Medicine

## 2023-09-25 VITALS — BP 118/70 | HR 64 | Temp 98.2°F | Ht 66.0 in | Wt 154.6 lb

## 2023-09-25 DIAGNOSIS — M25562 Pain in left knee: Secondary | ICD-10-CM | POA: Diagnosis not present

## 2023-09-25 DIAGNOSIS — Z23 Encounter for immunization: Secondary | ICD-10-CM | POA: Diagnosis not present

## 2023-09-25 DIAGNOSIS — L570 Actinic keratosis: Secondary | ICD-10-CM

## 2023-09-25 DIAGNOSIS — M7122 Synovial cyst of popliteal space [Baker], left knee: Secondary | ICD-10-CM

## 2023-09-25 DIAGNOSIS — M1712 Unilateral primary osteoarthritis, left knee: Secondary | ICD-10-CM | POA: Diagnosis not present

## 2023-09-25 NOTE — Patient Instructions (Addendum)
Voltaren gel is available over the counter at your local drug store, [employer], [employer], etc.  It is a  cream for joint pain due to arthritis.  It comes with a dosing card in the packaging.  You use this to determine how much medication to apply to the affected joint.  For large joints like a knee or hip you squirt enough medicine along the entire length of the line on the card, then scoop it off and apply it to your skin.  This can be used up to 3 times a day if needed.  If you are not hurting you do not have to use the medication.  Referral to dermatology was also placed.  You should expect a phone call about setting up this appointment.  If you have not heard anything about the appointment in the next few weeks notify clinic so we can follow-up for you.

## 2023-09-25 NOTE — Progress Notes (Signed)
Established Patient Office Visit   Subjective  Patient ID: Paige Levy, female    DOB: [birth year]  Age: 82 y.o. MRN: [id number]  Chief Complaint  Patient presents with   Leg Pain    Ongoing leg pain.  Seen in ED x 2.  Ultrasound negative.  Baker's cyst noted.  Patient accompanied by her husband  Patient is an 82 year old female seen for follow-up on leg pain.  Patient seen in ED on 8/1 and 8/13 for symptoms.  Ultrasound negative for DVT.   Patient on Eliquis.Small Baker's cyst noted in left popliteal fossa.  Patient states symptoms worse, pain moved up leg.  Patient's husband states patient has been limping and complaining about the pain which is not like her given her high tolerance.  Tried Tylenol without improvement.  Patient states symptoms started about 2 months ago after tripping over a tree root while walking her dog in the park.  Patient does not recall twisting her knee.  Hit left forearm and felt back.  Knee seems to hurt more when it was raining a few days ago.  Notes pain seems to improve with increased movement by the end of the day.  Denies edema, erythema, increased warmth of knee, tightness in posterior knee.  Having some pain radiate to anterior thigh.   Patient asked about skin lesion on left temple.  States is not pruritic but has increased in size since first noticed.  In the past patient saw dermatology on the [facility] but does not recall the name of office.  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    Past Medical History:  Diagnosis Date   Arthritis    Atrial fibrillation (HCC)    Hyperlipemia    Hypothyroidism    Meniere's disease    Thyroid disease    Past Surgical History:  Procedure Laterality Date   ABDOMINAL HYSTERECTOMY     CARDIOVERSION N/A 03/21/2021   Procedure: CARDIOVERSION;  Surgeon: Thurmon Fair, MD;  Location: [facility] ENDOSCOPY;  Service: Cardiovascular;  Laterality: N/A;   CARDIOVERSION N/A 07/05/2022   Procedure:  CARDIOVERSION;  Surgeon: Jodelle Red, MD;  Location: [facility] ENDOSCOPY;  Service: Cardiovascular;  Laterality: N/A;   ERCP Left 10/03/2014   Procedure: ENDOSCOPIC RETROGRADE CHOLANGIOPANCREATOGRAPHY (ERCP);  Surgeon: Barrie Folk, MD;  Location: [facility] ENDOSCOPY;  Service: Gastroenterology;  Laterality: Left;   ERCP N/A 01/14/2015   Procedure: ENDOSCOPIC RETROGRADE CHOLANGIOPANCREATOGRAPHY (ERCP);  Surgeon: Petra Kuba, MD;  Location: [facility] ENDOSCOPY;  Service: Endoscopy;  Laterality: N/A;  need lithotripter/ordered/LH   SPHINCTEROTOMY  09/2014   SPYGLASS LITHOTRIPSY N/A 01/14/2015   Procedure: ACZYSAYT LITHOTRIPSY;  Surgeon: Petra Kuba, MD;  Location: [facility] ENDOSCOPY;  Service: Endoscopy;  Laterality: N/A;   Social History   Tobacco Use   Smoking status: Never   Smokeless tobacco: Never  Substance Use Topics   Alcohol use: No   Drug use: No   Family History  Problem Relation Age of Onset   Other Mother    Heart attack Mother    Other Father    Allergies  Allergen Reactions   Ursodiol Other (See Comments)    Severe Dizziness   Rosuvastatin Other (See Comments)    Leg pain      ROS Negative unless stated above    Objective:     BP 118/70 (BP Location: Left Arm, Patient Position: Sitting, Cuff Size: Normal)   Pulse 64   Temp 98.2 F (36.8 C) (Oral)   Ht 5'  6" (1.676 m)   Wt 154 lb 9.6 oz (70.1 kg)   SpO2 97%   BMI 24.95 kg/m  BP Readings from Last 3 Encounters:  09/25/23 118/70  08/22/23 108/72  08/06/23 (!) 134/59   Wt Readings from Last 3 Encounters:  09/25/23 154 lb 9.6 oz (70.1 kg)  08/22/23 148 lb 12.8 oz (67.5 kg)  07/25/23 156 lb 8.4 oz (71 kg)      Physical Exam Constitutional:      General: She is not in acute distress.    Appearance: Normal appearance.  HENT:     Head: Normocephalic and atraumatic.     Nose: Nose normal.     Mouth/Throat:     Mouth: Mucous membranes are moist.  Cardiovascular:     Rate and Rhythm: Normal rate and regular  rhythm.     Heart sounds: Normal heart sounds. No murmur heard.    No gallop.  Pulmonary:     Effort: Pulmonary effort is normal. No respiratory distress.     Breath sounds: Normal breath sounds. No wheezing, rhonchi or rales.  Musculoskeletal:       Legs:     Comments: No crepitus of bilateral knees.  TTP of left tibial tuberosity.  A yellowish area of ecchymosis lateral to left tuberosity of LLE.  No TTP of popliteal fossa's bilaterally.  No TTP of joint line or patella bilaterally.  Mild TTP of anterior distal thigh midline.  Negative logroll, straight leg raise, FADIR, FABER bilaterally.  Skin:    General: Skin is warm and dry.     Findings: Bruising present.     Comments: Areas of ecchymosis in various stages of healing on left forearm and lateral left knee.  A dry, peeling, flaky slightly erythematous red area on left temple.  Nonpruritic.  Neurological:     Mental Status: She is alert and oriented to person, place, and time.      No results found for any visits on 09/25/23.    Assessment & Plan:  Left knee pain, unspecified chronicity -     DG Knee Complete 4 Views Left  Baker's cyst of knee, left -     DG Knee Complete 4 Views Left  Need for influenza vaccination -     Flu Vaccine Trivalent High Dose (Fluad)  Actinic keratosis -     Ambulatory referral to Dermatology  Continued left knee pain with radiation status post fall 2 months ago.  Concern arthritis versus cartilage damage causing symptoms.  Also consider fracture the patient does not recall falling directly on the knee.  May have chipped bone at the tibial tuberosity given point tenderness.  A 2 x 1 centimeter Baker's cyst was noted in left popliteal fossa on recent ultrasound.  This may also be contributing to symptoms.  Will obtain x-ray to evaluate for arthritis, osseous defect, bone spurs.  Discussed supportive care including heat, support, topical analgesics such as OTC Voltaren gel.  For continued or  worsening symptoms Ortho referral.  Referral placed to dermatology for removal of AK on left temple.  Influenza vaccine given this visit.  Patient will need to wait on COVID-vaccine for at least 3 months given recent infection 08/15/2023.  Return if symptoms worsen or fail to improve.   Deeann Saint, MD

## 2023-10-23 DIAGNOSIS — D692 Other nonthrombocytopenic purpura: Secondary | ICD-10-CM | POA: Diagnosis not present

## 2023-10-23 DIAGNOSIS — Z85828 Personal history of other malignant neoplasm of skin: Secondary | ICD-10-CM | POA: Diagnosis not present

## 2023-10-23 DIAGNOSIS — Z08 Encounter for follow-up examination after completed treatment for malignant neoplasm: Secondary | ICD-10-CM | POA: Diagnosis not present

## 2023-10-23 DIAGNOSIS — L538 Other specified erythematous conditions: Secondary | ICD-10-CM | POA: Diagnosis not present

## 2023-10-23 DIAGNOSIS — L820 Inflamed seborrheic keratosis: Secondary | ICD-10-CM | POA: Diagnosis not present

## 2023-10-28 ENCOUNTER — Ambulatory Visit
Admission: RE | Admit: 2023-10-28 | Discharge: 2023-10-28 | Disposition: A | Discharge status: Home / Self Care | Payer: Medicare Other | Source: Ambulatory Visit | Attending: Physician Assistant | Admitting: Physician Assistant

## 2023-10-28 DIAGNOSIS — Z780 Asymptomatic menopausal state: Secondary | ICD-10-CM

## 2023-10-28 DIAGNOSIS — M8588 Other specified disorders of bone density and structure, other site: Secondary | ICD-10-CM | POA: Diagnosis not present

## 2023-10-28 DIAGNOSIS — S2241XD Multiple fractures of ribs, right side, subsequent encounter for fracture with routine healing: Secondary | ICD-10-CM

## 2023-10-30 ENCOUNTER — Encounter: Payer: Self-pay | Admitting: Family Medicine

## 2023-10-30 DIAGNOSIS — M8580 Other specified disorders of bone density and structure, unspecified site: Secondary | ICD-10-CM | POA: Insufficient documentation

## 2023-10-31 ENCOUNTER — Telehealth: Payer: Self-pay | Admitting: Physician Assistant

## 2023-10-31 NOTE — Telephone Encounter (Signed)
Spoke with husband

## 2023-10-31 NOTE — Telephone Encounter (Signed)
Pt had TOC with dr banks on 08-19-2023 and pt husband said he is returning a call from [place]

## 2023-11-15 ENCOUNTER — Encounter: Payer: Self-pay | Admitting: Family Medicine

## 2023-11-15 ENCOUNTER — Ambulatory Visit (INDEPENDENT_AMBULATORY_CARE_PROVIDER_SITE_OTHER): Payer: Medicare Other | Admitting: Family Medicine

## 2023-11-15 VITALS — BP 132/68 | HR 56 | Temp 98.4°F | Ht 66.0 in | Wt 156.6 lb

## 2023-11-15 DIAGNOSIS — I83893 Varicose veins of bilateral lower extremities with other complications: Secondary | ICD-10-CM

## 2023-11-15 DIAGNOSIS — E039 Hypothyroidism, unspecified: Secondary | ICD-10-CM | POA: Diagnosis not present

## 2023-11-15 DIAGNOSIS — R5383 Other fatigue: Secondary | ICD-10-CM | POA: Diagnosis not present

## 2023-11-15 DIAGNOSIS — M791 Myalgia, unspecified site: Secondary | ICD-10-CM

## 2023-11-15 DIAGNOSIS — M1712 Unilateral primary osteoarthritis, left knee: Secondary | ICD-10-CM

## 2023-11-15 DIAGNOSIS — M7122 Synovial cyst of popliteal space [Baker], left knee: Secondary | ICD-10-CM | POA: Diagnosis not present

## 2023-11-15 DIAGNOSIS — M79605 Pain in left leg: Secondary | ICD-10-CM | POA: Diagnosis not present

## 2023-11-15 DIAGNOSIS — M79604 Pain in right leg: Secondary | ICD-10-CM | POA: Diagnosis not present

## 2023-11-15 NOTE — Progress Notes (Signed)
Established Patient Office Visit   Subjective  Patient ID: Paige Levy, female    DOB: 1941-06-06  Age: 82 y.o. MRN: [id number]  Chief Complaint  Patient presents with   Leg Pain    Bilateral leg pain,aches, at nigh the pain is worse, rate of pain 5 out of 10 but can be 10 out of 10 , patient is fatigue   Pt accompanied by her husband.  Patient is an 82 year old female seen for follow-up on ongoing concern.  Patient with continued, constant aching in bilateral legs.  Pain noted as 5/10, worse at night 10/10.  Tylenol does not seem to help.  Voltaren gel helps some.  Per patient's husband she no longer likes walking the dog due to symptoms.  Patient denies low back pain, hip pain, edema of LEs.  At times first in the morning has difficulty moving around.  Symptoms typically improved during the day.  Tried compression socks but they were too tight.  Previously seen in ED for similar symptoms.  Ultrasound negative for DVT.  Baker's cyst of left knee noted.  X-ray of left knee with mild degenerative joint disease.  On Eliquis for history of A-fib.  Patient also notes fatigue.  States ready to go to bed by late afternoon/early evening.  May wake up around 4 or 5 AM.    Patient Active Problem List   Diagnosis Date Noted   Osteopenia 10/30/2023   Neoplasm of uncertain behavior of skin 08/28/2022   Secondary hypercoagulable state (HCC) 03/29/2021   Current use of long term anticoagulation 03/29/2021   Essential hypertension 03/29/2021   Pure hypercholesterolemia 03/29/2021   Atrial fibrillation (HCC) 02/08/2021   Pulsatile tinnitus of right ear 01/01/2020   Submandibular lymphadenopathy 11/18/2019   Chronic rhinitis 04/27/2016   Hearing loss 04/27/2016   Active cochlear Meniere's disease, bilateral 01/17/2015   Meniere's disease 10/01/2014   Dyslipidemia 10/01/2014   Hypothyroidism 11/09/2013   Osteoarthritis 11/09/2013   Hyperlipidemia 11/09/2013   Past Medical History:   Diagnosis Date   Arthritis    Atrial fibrillation (HCC)    Hyperlipemia    Hypothyroidism    Meniere's disease    Thyroid disease    Past Surgical History:  Procedure Laterality Date   ABDOMINAL HYSTERECTOMY     CARDIOVERSION N/A 03/21/2021   Procedure: CARDIOVERSION;  Surgeon: Thurmon Fair, MD;  Location: [facility] ENDOSCOPY;  Service: Cardiovascular;  Laterality: N/A;   CARDIOVERSION N/A 07/05/2022   Procedure: CARDIOVERSION;  Surgeon: Jodelle Red, MD;  Location: [facility] ENDOSCOPY;  Service: Cardiovascular;  Laterality: N/A;   ERCP Left 10/03/2014   Procedure: ENDOSCOPIC RETROGRADE CHOLANGIOPANCREATOGRAPHY (ERCP);  Surgeon: Barrie Folk, MD;  Location: [facility] ENDOSCOPY;  Service: Gastroenterology;  Laterality: Left;   ERCP N/A 01/14/2015   Procedure: ENDOSCOPIC RETROGRADE CHOLANGIOPANCREATOGRAPHY (ERCP);  Surgeon: Petra Kuba, MD;  Location: [facility] ENDOSCOPY;  Service: Endoscopy;  Laterality: N/A;  need lithotripter/ordered/LH   SPHINCTEROTOMY  09/2014   SPYGLASS LITHOTRIPSY N/A 01/14/2015   Procedure: [id number] LITHOTRIPSY;  Surgeon: Petra Kuba, MD;  Location: [facility] ENDOSCOPY;  Service: Endoscopy;  Laterality: N/A;   Social History   Tobacco Use   Smoking status: Never   Smokeless tobacco: Never  Substance Use Topics   Alcohol use: No   Drug use: No   Family History  Problem Relation Age of Onset   Other Mother    Heart attack Mother    Other Father    Allergies  Allergen Reactions   Ursodiol Other (See Comments)  Severe Dizziness   Rosuvastatin Other (See Comments)    Leg pain      ROS Negative unless stated above    Objective:     BP 132/68 (BP Location: Right Arm, Patient Position: Sitting, Cuff Size: Normal)   Pulse (!) 56   Temp 98.4 F (36.9 C) (Oral)   Ht 5\' 6"  (1.676 m)   Wt 156 lb 9.6 oz (71 kg)   SpO2 98%   BMI 25.28 kg/m  BP Readings from Last 3 Encounters:  11/15/23 132/68  09/25/23 118/70  08/22/23 108/72   Wt Readings from Last 3  Encounters:  11/15/23 156 lb 9.6 oz (71 kg)  09/25/23 154 lb 9.6 oz (70.1 kg)  08/22/23 148 lb 12.8 oz (67.5 kg)      Physical Exam Constitutional:      General: She is not in acute distress.    Appearance: Normal appearance.  HENT:     Head: Normocephalic and atraumatic.     Nose: Nose normal.     Mouth/Throat:     Mouth: Mucous membranes are moist.  Cardiovascular:     Rate and Rhythm: Normal rate and regular rhythm.     Heart sounds: Normal heart sounds. No murmur heard.    No gallop.     Comments: Varicose veins on bilateral LEs R>L. Pulmonary:     Effort: Pulmonary effort is normal. No respiratory distress.     Breath sounds: Normal breath sounds. No wheezing, rhonchi or rales.  Musculoskeletal:     Right lower leg: No edema.     Left lower leg: No edema.     Comments: No TTP of bilateral LEs.  No effusion of bilateral knees.  Skin:    General: Skin is warm and dry.  Neurological:     Mental Status: She is alert and oriented to person, place, and time.     No results found for any visits on 11/15/23.    Assessment & Plan:  Bilateral leg pain -     Ambulatory referral to Vascular Surgery -     Ambulatory referral to Orthopedic Surgery  Myalgia -     Ambulatory referral to Vascular Surgery  Varicose veins of bilateral lower extremities with other complications -     Ambulatory referral to Vascular Surgery  Primary osteoarthritis of left knee -     Ambulatory referral to Orthopedic Surgery  Baker's cyst of knee, left -     Ambulatory referral to Orthopedic Surgery  Fatigue, unspecified type -     TSH; Future -     CBC with Differential/Platelet; Future  Acquired hypothyroidism -     TSH; Future  Patient seen for continued aching of bilateral LEs.  Not currently on statin.  Other medications reviewed for possible causes.  Consider varicose veins contributing to symptoms.  Mild degenerative joint disease/Baker's cyst may also be contributing to symptoms.   Discussed compression socks and a larger size versus Ace bandages to wrap LEs, elevation of Les. Discussed referral to vascular surgery and Ortho for symptoms.  Consider rechecking TSH and CBC for fatigue.  Continue Voltaren gel, heat and exercise for symptoms.  Return if symptoms worsen or fail to improve.   Deeann Saint, MD

## 2023-11-24 IMAGING — CT CT ANGIO CHEST
2 of 7 series · 17 of 46 positions shown · IV contrast (agent unspecified)
Comparison: None

CLINICAL DATA: Shortness of breath and cough for 2 days, history of
atrial fibrillation and cardioversion, hyperlipidemia, hypertension

EXAM:
CT ANGIOGRAPHY CHEST WITH CONTRAST
TECHNIQUE: Multidetector CT imaging of the chest was performed using the
standard protocol during bolus administration of intravenous
contrast. Multiplanar CT image reconstructions and MIPs were
obtained to evaluate the vascular anatomy.

[Series 5: pe axial thins · axial · 0.75mm/px · z∈[+1078,+1354]mm · 14 of 320 slices shown]
[im 22/320  lung]
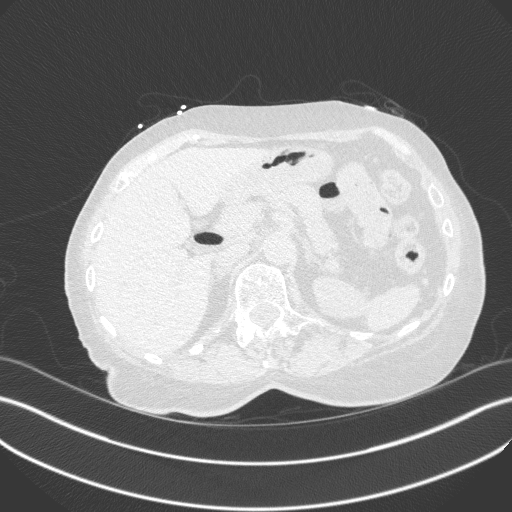
[im 43/320  soft-tissue]
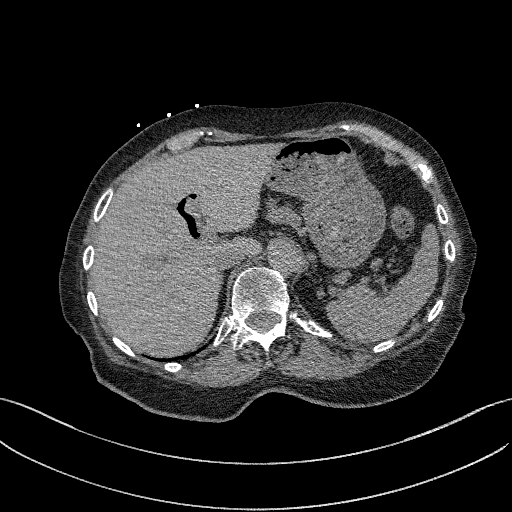
[im 64/320  lung]
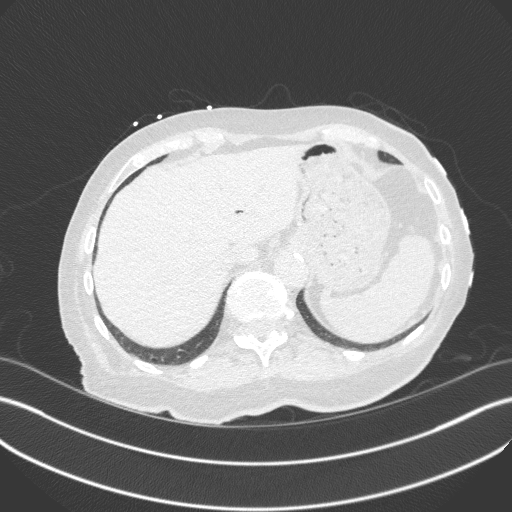
[im 86/320  soft-tissue]
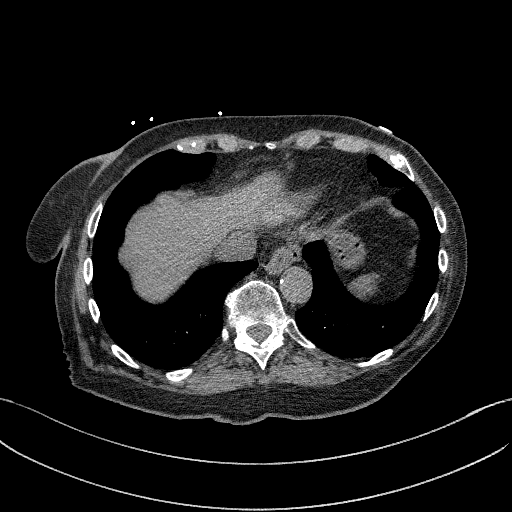
[im 107/320  lung]
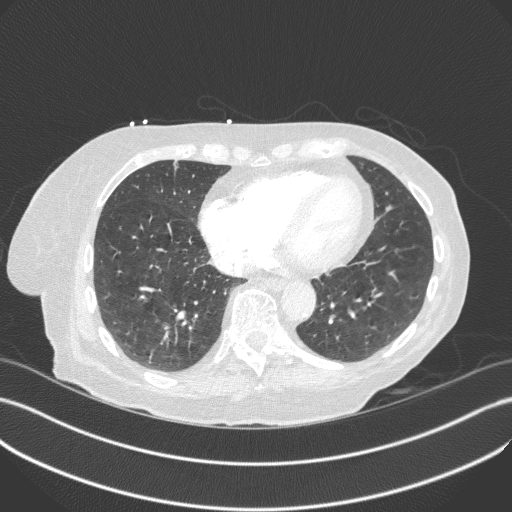
[im 128/320  soft-tissue]
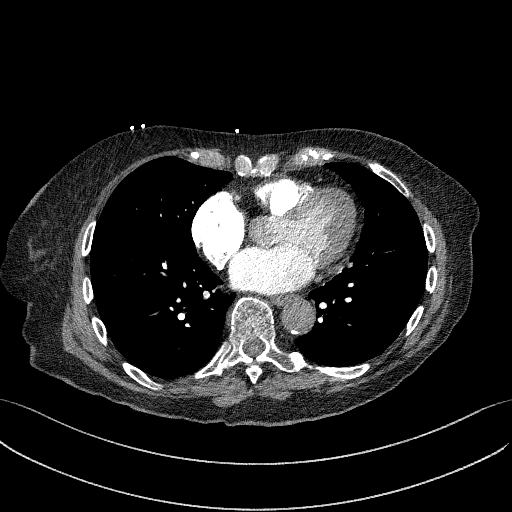
[im 149/320  lung]
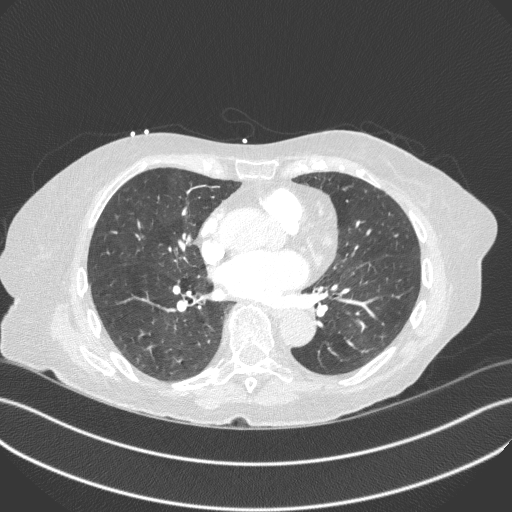
[im 171/320  soft-tissue]
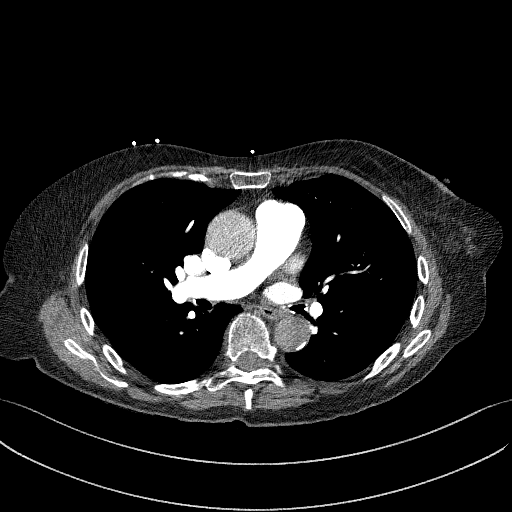
[im 192/320  lung]
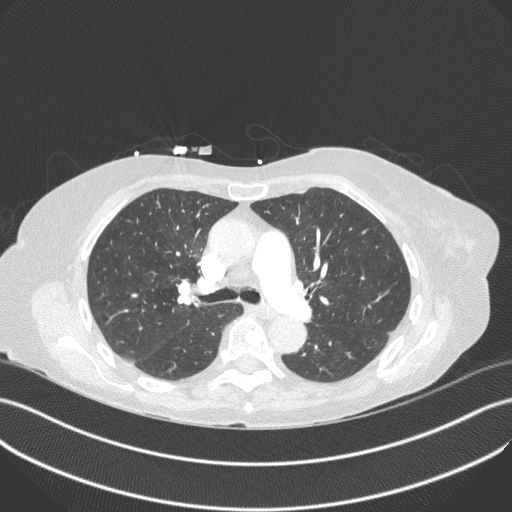
[im 213/320  soft-tissue]
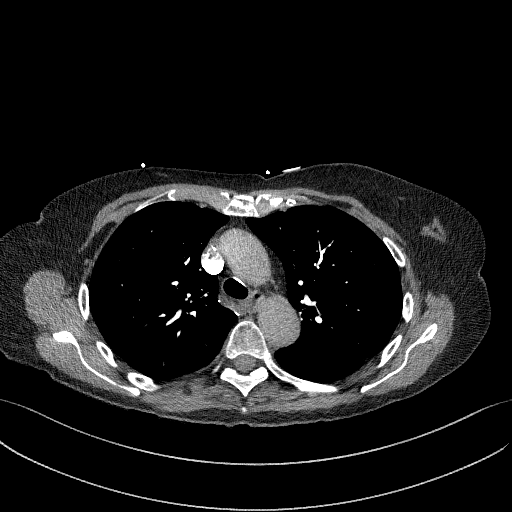
[im 234/320  lung]
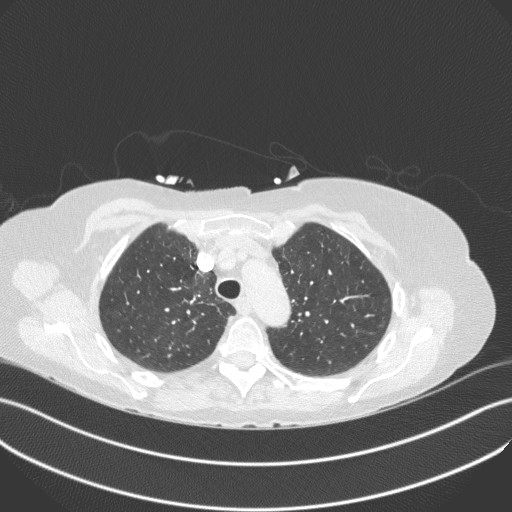
[im 256/320  soft-tissue]
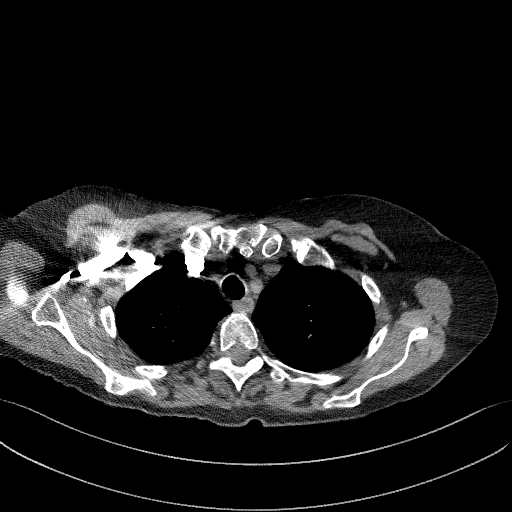
[im 277/320  lung]
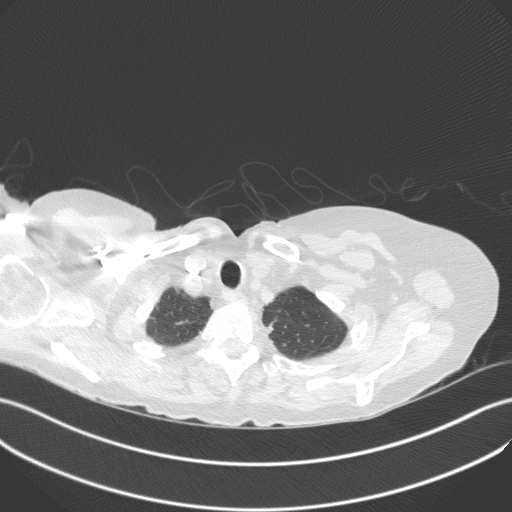
[im 298/320  soft-tissue]
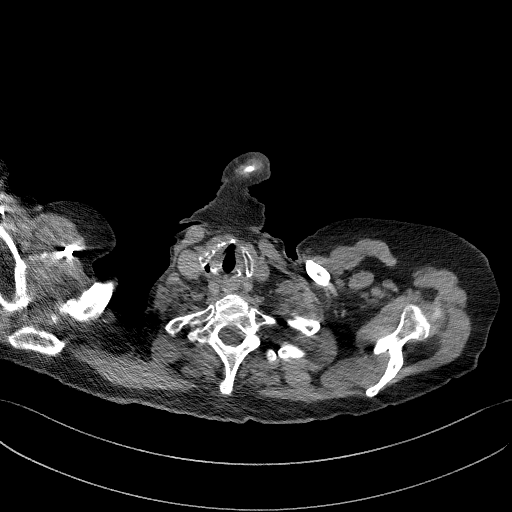

[Series 7: cor soft · coronal · 0.62mm/px · 3 of 127 slices shown]
[im 32/127  soft-tissue]
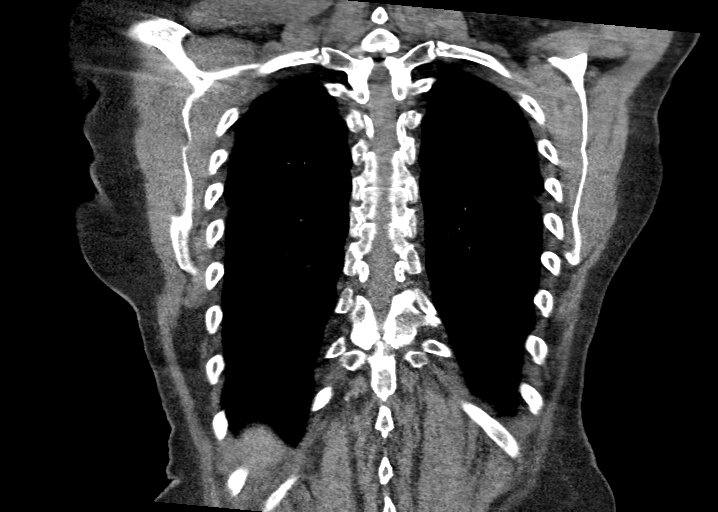
[im 64/127  soft-tissue]
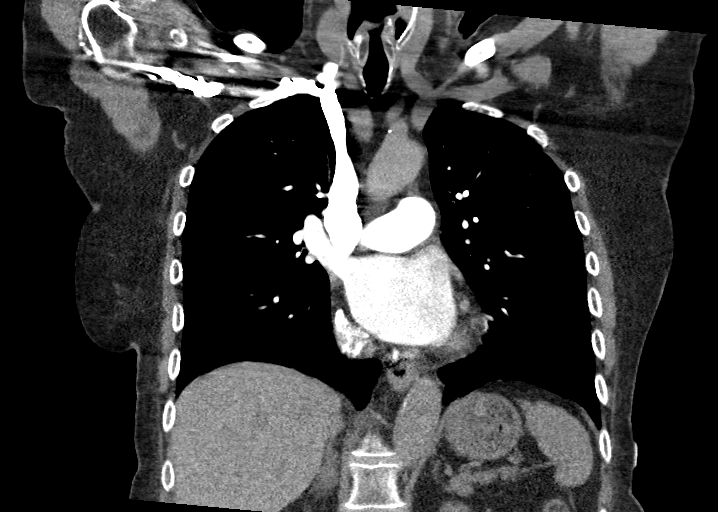
[im 95/127  soft-tissue]
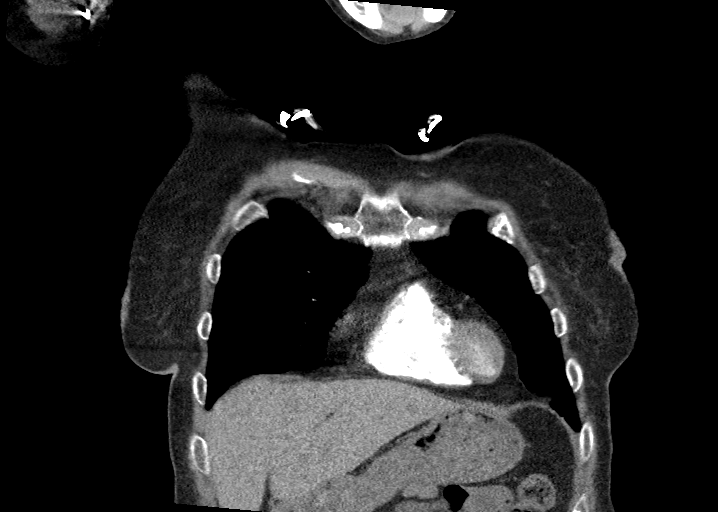

[17 of 46 positions shown; findings below may reference images not displayed]

RADIATION DOSE REDUCTION: This exam was performed according to the
departmental dose-optimization program which includes automated
exposure control, adjustment of the mA and/or kV according to
patient size and/or use of iterative reconstruction technique.

CONTRAST:  75mL OMNIPAQUE IOHEXOL 350 MG/ML SOLN IV
FINDINGS: Cardiovascular: Atherosclerotic calcifications aorta aorta. Aorta
normal caliber. Heart unremarkable. No pericardial effusion.
Pulmonary arteries adequately opacified and patent. No evidence of
pulmonary embolism.

Mediastinum/Nodes: Base of cervical region normal appearance.
Esophagus unremarkable. No thoracic adenopathy. Tiny hiatal hernia.

Lungs/Pleura: Lungs clear. No pulmonary infiltrate, pleural
effusion, or pneumothorax.

Upper Abdomen: Tiny cyst at upper pole LEFT kidney. Pneumobilia
consistent with history of ERCP. Gallbladder surgically absent.

Musculoskeletal: Bone island T6.  No acute osseous findings.

Review of the MIP images confirms the above findings.
IMPRESSION: No evidence of pulmonary embolism.

Tiny hiatal hernia.

Aortic Atherosclerosis ([id number]-[id number].A).

## 2023-11-24 IMAGING — DX DG CHEST 1V PORT
1 series · 1 of 1 positions shown · non-contrast
Comparison: Chest radiograph 04/27/2021

CLINICAL DATA: Shortness of breath with cough and headache.

EXAM:
PORTABLE CHEST 1 VIEW

[chest ap]
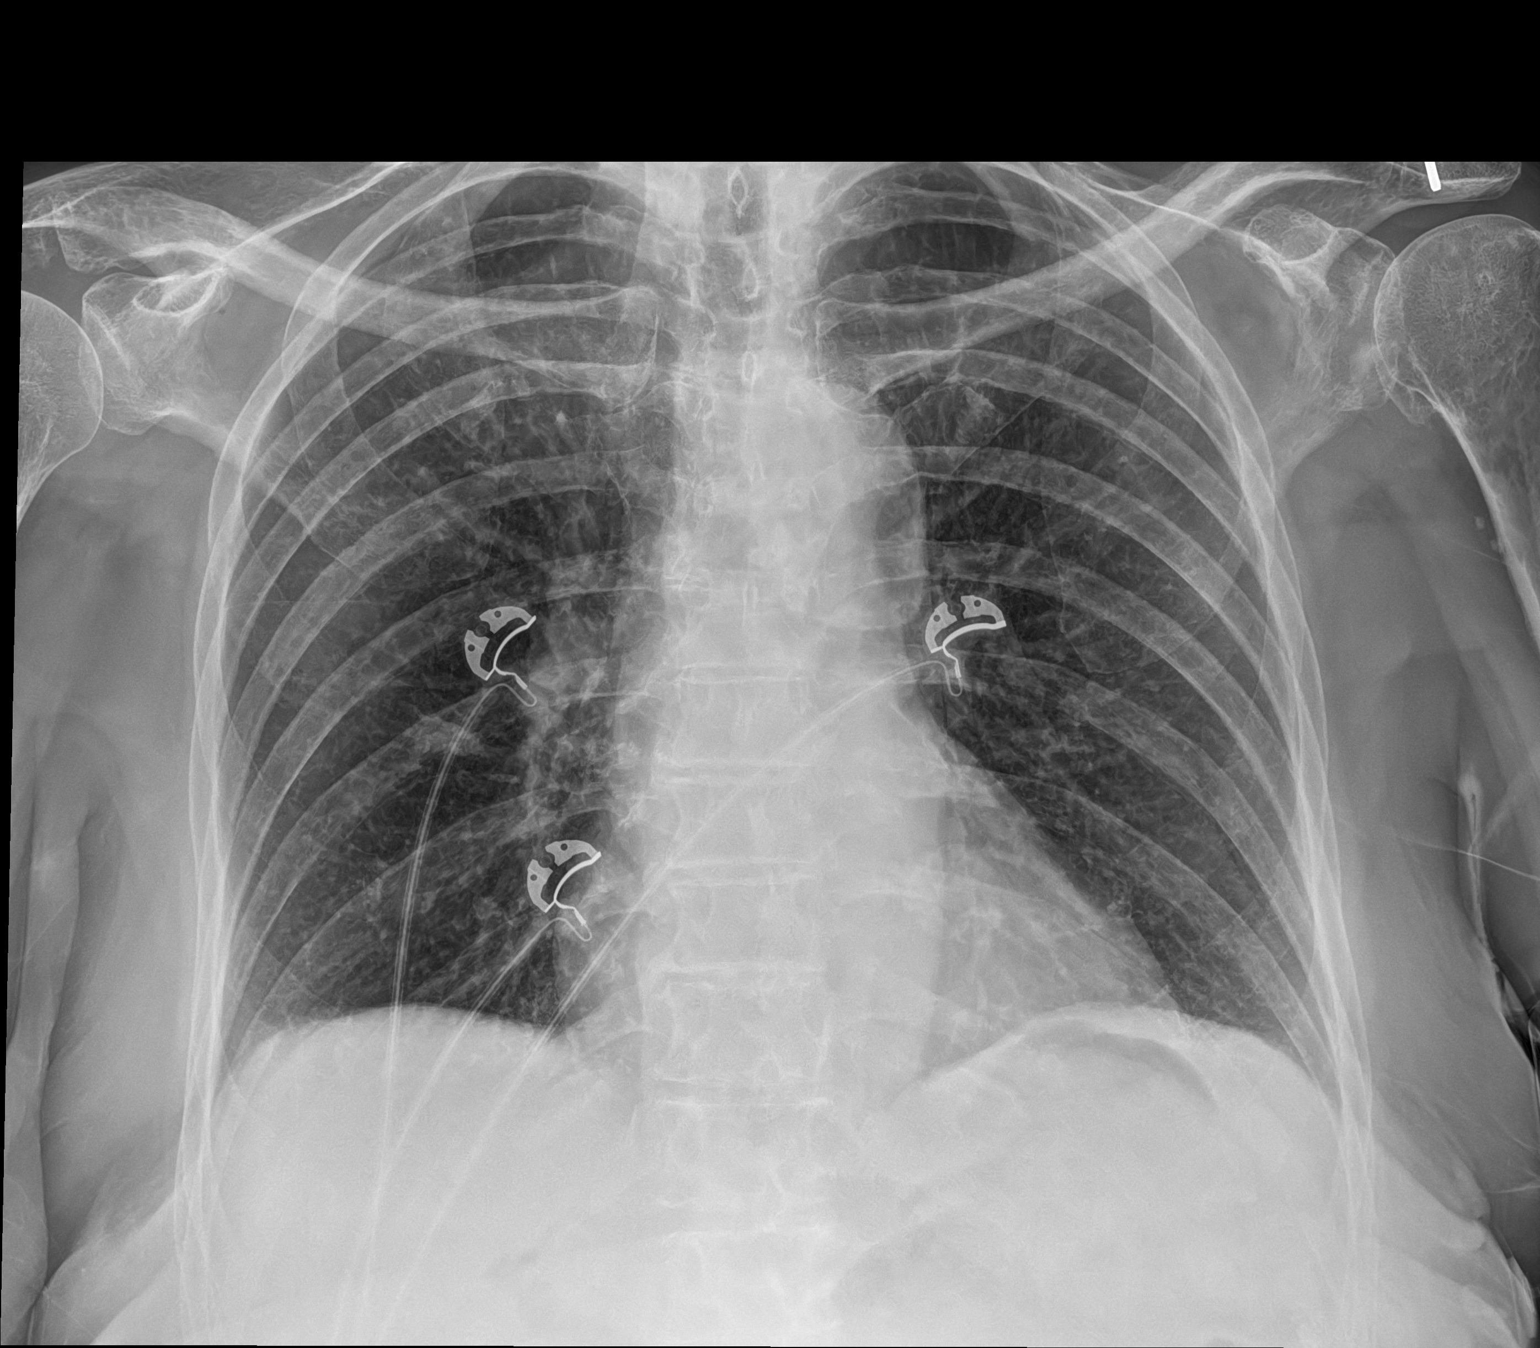

[1 of 1 positions shown; findings below may reference images not displayed]

FINDINGS: The heart size and mediastinal contours are within normal limits.
Both lungs are clear. No pleural effusion or pneumothorax.
Osteoarthritis of the left glenohumeral joint with marginal
osteophytes.
IMPRESSION: No acute cardiopulmonary abnormality.

## 2023-11-29 ENCOUNTER — Encounter: Payer: Self-pay | Admitting: Surgical

## 2023-11-29 ENCOUNTER — Other Ambulatory Visit (INDEPENDENT_AMBULATORY_CARE_PROVIDER_SITE_OTHER): Payer: Medicare Other

## 2023-11-29 ENCOUNTER — Other Ambulatory Visit: Payer: Self-pay

## 2023-11-29 ENCOUNTER — Ambulatory Visit (INDEPENDENT_AMBULATORY_CARE_PROVIDER_SITE_OTHER): Payer: Medicare Other | Admitting: Surgical

## 2023-11-29 DIAGNOSIS — M79604 Pain in right leg: Secondary | ICD-10-CM

## 2023-11-29 DIAGNOSIS — M79605 Pain in left leg: Secondary | ICD-10-CM

## 2023-11-29 DIAGNOSIS — M1611 Unilateral primary osteoarthritis, right hip: Secondary | ICD-10-CM

## 2023-11-30 ENCOUNTER — Encounter: Payer: Self-pay | Admitting: Surgical

## 2023-11-30 MED ORDER — METHYLPREDNISOLONE ACETATE 40 MG/ML IJ SUSP
40.0000 mg | INTRAMUSCULAR | Status: AC | PRN
Start: 2023-11-29 — End: 2023-11-29
  Administered 2023-11-29: 40 mg via INTRA_ARTICULAR

## 2023-11-30 MED ORDER — LIDOCAINE HCL 1 % IJ SOLN
5.0000 mL | INTRAMUSCULAR | Status: AC | PRN
Start: 2023-11-29 — End: 2023-11-29
  Administered 2023-11-29: 5 mL

## 2023-11-30 MED ORDER — BUPIVACAINE HCL 0.25 % IJ SOLN
7.0000 mL | INTRAMUSCULAR | Status: AC | PRN
Start: 2023-11-29 — End: 2023-11-29
  Administered 2023-11-29: 7 mL via INTRA_ARTICULAR

## 2023-11-30 NOTE — Progress Notes (Signed)
Office Visit Note   Patient: Paige Levy           Date of Birth: 1941-11-17           MRN: [id number] Visit Date: 11/29/2023 Requested by: Deeann Saint, MD [address] [place],  [place] [address] PCP: Deeann Saint, MD  Subjective: Chief Complaint  Patient presents with   Left Leg - Pain   Right Leg - Pain    HPI: Paige Levy is a 82 y.o. female who presents to the office reporting bilateral leg pain.  Patient states that she is a very active individual and enjoys walking her dog with her husband about twice daily.  However over the last 2 to 3 months she has noticed increased pain in both of her legs that primarily is localized around the knees with radiation into the shins at times.  She denies any low back pain, foot pain, numbness/tingling, radicular pain down the entirety of the leg.  She wears compression socks.  She has appointment upcoming with vascular surgery in order to see if her varicose veins are the cause of her symptoms.  She uses Voltaren gel which does provide some relief.  She has no history of prior orthopedic surgery.  She describes the pain as an aching pain and this will occasionally wake her up from sleep at night.  No significant groin pain that she has noticed.  No recent falls or injuries..                ROS: All systems reviewed are negative as they relate to the chief complaint within the history of present illness.  Patient denies fevers or chills.  Assessment & Plan: Visit Diagnoses:  1. Bilateral leg pain     Plan: Patient is a 82 year old female who presents for evaluation of bilateral leg pain.  She has atraumatic onset of leg pain that has been steadily worsening over the last 2 to 3 months.  Describes this as an aching pain that keeps her up at night at times.  It is worse with ambulation and has caused her to have to stop walking her dog with her husband unfortunately.  She does have some mild degenerative changes of both  knees, worse in the left knee, on radiographs today.  However, the most compelling pathology is the diffuse degenerative changes throughout her lumbar spine and the severe end-stage arthritis noted in the right hip.  She does have pain reproduced in the right knee with hip range of motion.  We discussed options available to patient today and after discussion, she would like to try right hip injection for diagnostic purposes.  Think that it would also be good to try left knee injection as well but she is a little apprehensive about the side effects of the injections and she has some history of difficulty with blood pressure so we will hold off on 2 cortisone injections at once.  Follow-up in 10 days for clinical recheck to see how her hip is doing and to see if she would like to try knee injection.  If both of these fail to give her any relief, next step would be advanced imaging of her lumbar spine to further evaluate for radicular pain source.  Follow-Up Instructions: No follow-ups on file.   Orders:  Orders Placed This Encounter  Procedures   XR Lumbar Spine 2-3 Views   XR KNEE 3 VIEW LEFT   XR KNEE 3 VIEW RIGHT  XR HIP UNILAT W OR W/O PELVIS 2-3 VIEWS RIGHT   US Guided Needle Placement - No Linked Charges   No orders of the defined types were placed in this encounter.     Procedures: Large Joint Inj: R hip joint on 11/29/2023 6:42 PM Indications: pain and diagnostic evaluation Details: 22 G 3.5 in needle, ultrasound-guided anterior approach Medications: 5 mL lidocaine 1 %; 7 mL bupivacaine 0.25 %; 40 mg methylPREDNISolone acetate 40 MG/ML Outcome: tolerated well, no immediate complications Procedure, treatment alternatives, risks and benefits explained, specific risks discussed. Consent was given by the patient. Immediately prior to procedure a time out was called to verify the correct patient, procedure, equipment, support staff and site/side marked as required. Patient was prepped and  draped in the usual sterile fashion.       Clinical Data: No additional findings.  Objective: Vital Signs: There were no vitals taken for this visit.  Physical Exam:  Constitutional: Patient appears well-developed HEENT:  Head: Normocephalic Eyes:EOM are normal Neck: Normal range of motion Cardiovascular: Normal rate Pulmonary/chest: Effort normal Neurologic: Patient is alert Skin: Skin is warm Psychiatric: Patient has normal mood and affect  Ortho Exam: Ortho exam demonstrates bilateral knees with no effusion.  She really does not have any significant joint line tenderness over the medial or lateral joint lines of either knee.  No focal swelling or any tenderness noted over the tibial shaft of either leg.  She is able to perform straight leg raise without extensor lag.  Stable to anterior and posterior drawer sign in both legs.  No cellulitis or skin changes noted.  She does have positive Stinchfield sign on the right leg and negative on the left.  She has significantly reduced range of motion in regards to the internal rotation of her right hip with about 10 degrees internal rotation of the right hip versus 25 degrees internal rotation of the left hip.  She has knee pain that is reproduced with FADIR sign of the right hip.  No calf tenderness.  Negative Homans' sign bilaterally.  Palpable DP pulse of bilateral lower extremities.  Specialty Comments:  No specialty comments available.  Imaging: No results found.   PMFS History: Patient Active Problem List   Diagnosis Date Noted   Osteopenia 10/30/2023   Neoplasm of uncertain behavior of skin 08/28/2022   Secondary hypercoagulable state (HCC) 03/29/2021   Current use of long term anticoagulation 03/29/2021   Essential hypertension 03/29/2021   Pure hypercholesterolemia 03/29/2021   Atrial fibrillation (HCC) 02/08/2021   Pulsatile tinnitus of right ear 01/01/2020   Submandibular lymphadenopathy 11/18/2019   Chronic rhinitis  04/27/2016   Hearing loss 04/27/2016   Active cochlear Meniere's disease, bilateral 01/17/2015   Meniere's disease 10/01/2014   Dyslipidemia 10/01/2014   Hypothyroidism 11/09/2013   Osteoarthritis 11/09/2013   Hyperlipidemia 11/09/2013   Past Medical History:  Diagnosis Date   Arthritis    Atrial fibrillation (HCC)    Hyperlipemia    Hypothyroidism    Meniere's disease    Thyroid disease     Family History  Problem Relation Age of Onset   Other Mother    Heart attack Mother    Other Father     Past Surgical History:  Procedure Laterality Date   ABDOMINAL HYSTERECTOMY     CARDIOVERSION N/A 03/21/2021   Procedure: CARDIOVERSION;  Surgeon: Thurmon Fair, MD;  Location: [facility] ENDOSCOPY;  Service: Cardiovascular;  Laterality: N/A;   CARDIOVERSION N/A 07/05/2022   Procedure: CARDIOVERSION;  Surgeon: Cristal Deer,  Hughie Closs, MD;  Location: [facility] ENDOSCOPY;  Service: Cardiovascular;  Laterality: N/A;   ERCP Left 10/03/2014   Procedure: ENDOSCOPIC RETROGRADE CHOLANGIOPANCREATOGRAPHY (ERCP);  Surgeon: Barrie Folk, MD;  Location: [facility] ENDOSCOPY;  Service: Gastroenterology;  Laterality: Left;   ERCP N/A 01/14/2015   Procedure: ENDOSCOPIC RETROGRADE CHOLANGIOPANCREATOGRAPHY (ERCP);  Surgeon: Petra Kuba, MD;  Location: [facility] ENDOSCOPY;  Service: Endoscopy;  Laterality: N/A;  need lithotripter/ordered/LH   SPHINCTEROTOMY  09/2014   SPYGLASS LITHOTRIPSY N/A 01/14/2015   Procedure: [id number] LITHOTRIPSY;  Surgeon: Petra Kuba, MD;  Location: [facility] ENDOSCOPY;  Service: Endoscopy;  Laterality: N/A;   Social History   Occupational History   Not on file  Tobacco Use   Smoking status: Never   Smokeless tobacco: Never  Substance and Sexual Activity   Alcohol use: No   Drug use: No   Sexual activity: Never

## 2023-12-09 ENCOUNTER — Encounter: Payer: Self-pay | Admitting: Surgical

## 2023-12-09 ENCOUNTER — Ambulatory Visit: Payer: Medicare Other | Admitting: Surgical

## 2023-12-09 DIAGNOSIS — M1611 Unilateral primary osteoarthritis, right hip: Secondary | ICD-10-CM

## 2023-12-09 DIAGNOSIS — M79604 Pain in right leg: Secondary | ICD-10-CM

## 2023-12-09 DIAGNOSIS — M79605 Pain in left leg: Secondary | ICD-10-CM | POA: Diagnosis not present

## 2023-12-09 NOTE — Progress Notes (Signed)
Follow-up Office Visit Note   Patient: Paige Levy           Date of Birth: 10/02/1941           MRN: [id number] Visit Date: 12/09/2023 Requested by: Deeann Saint, MD [address] [place],  [place] [address] PCP: Deeann Saint, MD  Subjective: Chief Complaint  Patient presents with   Right Hip - Follow-up   Left Knee - Follow-up    HPI: Paige Levy is a 82 y.o. female who returns to the office for follow-up visit.    Plan at last visit was: Patient is a 82 year old female who presents for evaluation of bilateral leg pain. She has atraumatic onset of leg pain that has been steadily worsening over the last 2 to 3 months. Describes this as an aching pain that keeps her up at night at times. It is worse with ambulation and has caused her to have to stop walking her dog with her husband unfortunately. She does have some mild degenerative changes of both knees, worse in the left knee, on radiographs today. However, the most compelling pathology is the diffuse degenerative changes throughout her lumbar spine and the severe end-stage arthritis noted in the right hip. She does have pain reproduced in the right knee with hip range of motion. We discussed options available to patient today and after discussion, she would like to try right hip injection for diagnostic purposes. Think that it would also be good to try left knee injection as well but she is a little apprehensive about the side effects of the injections and she has some history of difficulty with blood pressure so we will hold off on 2 cortisone injections at once. Follow-up in 10 days for clinical recheck to see how her hip is doing and to see if she would like to try knee injection. If both of these fail to give her any relief, next step would be advanced imaging of her lumbar spine to further evaluate for radicular pain source.   Since then, patient notes she has had significant resolution of right leg pain after  the hip injection.  She has been more mobile and able to take part in walks with her dog and husband.  Pain is no longer waking her up from sleep at night and she sleeps through the night without any difficulty.  Regarding the left leg, this is not bothering her as much as it was last time either and not currently bothering her enough for any intervention.              ROS: All systems reviewed are negative as they relate to the chief complaint within the history of present illness.  Patient denies fevers or chills.  Assessment & Plan: Visit Diagnoses:  1. Arthritis of right hip   2. Bilateral leg pain     Plan: Paige Levy is a 82 y.o. female who returns to the office for follow-up visit.  Plan from last visit was noted above in HPI.  They now return with significant resolution of right leg pain around the knee from the hip injection administered at last visit.  She has been more mobile and no longer has nighttime pain waking her up from sleep.  She is here today to see how she did after the hip injection as well as to try left knee injection but overall her knee is tolerable and she would like to avoid injection if this is  possible.  As such, we will have Zynah follow-up with the office as needed if her right leg symptoms recur or if the left knee symptoms worsen.   Follow-Up Instructions: Return if symptoms worsen or fail to improve.   Orders:  No orders of the defined types were placed in this encounter.  No orders of the defined types were placed in this encounter.     Procedures: No procedures performed   Clinical Data: No additional findings.  Objective: Vital Signs: There were no vitals taken for this visit.  Physical Exam:  Constitutional: Patient appears well-developed HEENT:  Head: Normocephalic Eyes:EOM are normal Neck: Normal range of motion Cardiovascular: Normal rate Pulmonary/chest: Effort normal Neurologic: Patient is alert Skin: Skin is  warm Psychiatric: Patient has normal mood and affect  Ortho Exam: Ortho exam demonstrates left knee with small trace effusion.  Tenderness over the medial joint line.  No tenderness over the lateral joint line.  No pain with hip range of motion.  Able to perform straight leg raise.  Small Baker's cyst is palpable.  No calf tenderness.  Negative Homans' sign.  Intact ankle dorsiflexion and plantarflexion.  No cellulitis or skin changes noted.  Specialty Comments:  No specialty comments available.  Imaging: No results found.   PMFS History: Patient Active Problem List   Diagnosis Date Noted   Osteopenia 10/30/2023   Neoplasm of uncertain behavior of skin 08/28/2022   Secondary hypercoagulable state (HCC) 03/29/2021   Current use of long term anticoagulation 03/29/2021   Essential hypertension 03/29/2021   Pure hypercholesterolemia 03/29/2021   Atrial fibrillation (HCC) 02/08/2021   Pulsatile tinnitus of right ear 01/01/2020   Submandibular lymphadenopathy 11/18/2019   Chronic rhinitis 04/27/2016   Hearing loss 04/27/2016   Active cochlear Meniere's disease, bilateral 01/17/2015   Meniere's disease 10/01/2014   Dyslipidemia 10/01/2014   Hypothyroidism 11/09/2013   Osteoarthritis 11/09/2013   Hyperlipidemia 11/09/2013   Past Medical History:  Diagnosis Date   Arthritis    Atrial fibrillation (HCC)    Hyperlipemia    Hypothyroidism    Meniere's disease    Thyroid disease     Family History  Problem Relation Age of Onset   Other Mother    Heart attack Mother    Other Father     Past Surgical History:  Procedure Laterality Date   ABDOMINAL HYSTERECTOMY     CARDIOVERSION N/A 03/21/2021   Procedure: CARDIOVERSION;  Surgeon: Thurmon Fair, MD;  Location: [facility] ENDOSCOPY;  Service: Cardiovascular;  Laterality: N/A;   CARDIOVERSION N/A 07/05/2022   Procedure: CARDIOVERSION;  Surgeon: Jodelle Red, MD;  Location: [facility] ENDOSCOPY;  Service: Cardiovascular;  Laterality:  N/A;   ERCP Left 10/03/2014   Procedure: ENDOSCOPIC RETROGRADE CHOLANGIOPANCREATOGRAPHY (ERCP);  Surgeon: Barrie Folk, MD;  Location: [facility] ENDOSCOPY;  Service: Gastroenterology;  Laterality: Left;   ERCP N/A 01/14/2015   Procedure: ENDOSCOPIC RETROGRADE CHOLANGIOPANCREATOGRAPHY (ERCP);  Surgeon: Petra Kuba, MD;  Location: [facility] ENDOSCOPY;  Service: Endoscopy;  Laterality: N/A;  need lithotripter/ordered/LH   SPHINCTEROTOMY  09/2014   SPYGLASS LITHOTRIPSY N/A 01/14/2015   Procedure: [id number] LITHOTRIPSY;  Surgeon: Petra Kuba, MD;  Location: [facility] ENDOSCOPY;  Service: Endoscopy;  Laterality: N/A;   Social History   Occupational History   Not on file  Tobacco Use   Smoking status: Never   Smokeless tobacco: Never  Substance and Sexual Activity   Alcohol use: No   Drug use: No   Sexual activity: Never

## 2024-01-13 ENCOUNTER — Other Ambulatory Visit: Payer: Self-pay | Admitting: *Deleted

## 2024-01-13 DIAGNOSIS — I8393 Asymptomatic varicose veins of bilateral lower extremities: Secondary | ICD-10-CM

## 2024-01-22 ENCOUNTER — Ambulatory Visit: Payer: Medicare Other | Admitting: Physician Assistant

## 2024-01-22 ENCOUNTER — Ambulatory Visit (HOSPITAL_COMMUNITY)
Admission: RE | Admit: 2024-01-22 | Discharge: 2024-01-22 | Disposition: A | Discharge status: Home / Self Care | Payer: Medicare Other | Source: Ambulatory Visit | Attending: Vascular Surgery | Admitting: Vascular Surgery

## 2024-01-22 VITALS — BP 146/76 | HR 73 | Temp 98.0°F | Resp 18 | Ht 66.0 in | Wt 153.8 lb

## 2024-01-22 DIAGNOSIS — I872 Venous insufficiency (chronic) (peripheral): Secondary | ICD-10-CM | POA: Diagnosis not present

## 2024-01-22 DIAGNOSIS — I8393 Asymptomatic varicose veins of bilateral lower extremities: Secondary | ICD-10-CM | POA: Diagnosis not present

## 2024-01-22 NOTE — Progress Notes (Signed)
[facility] [place]   Reason for referral: left Knee pain that radiates down the left leg at night  History of Present Illness  Paige Levy is a 83 y.o. female who presents with chief complaint: swollen leg.  Patient notes, onset of pain over the last several months months ago, associated with resting at night and walking.  The patient has had no history of DVT, positive history of varicose vein, no history of venous stasis ulcers, no history of  Lymphedema and no history of skin changes in lower legs.  There is unknown family history of venous disorders.  The patient has used compression stockings in the past.  She states she has had aching pain in the left leg while trying to rest at night.  The more she walks the more the knee aches. She denies claudication and non healing wounds. Her and her husband walked a lot last summer up to 2 miles a day and they walked their dog twice a day.  The dog was lost to cancer just 2 weeks ago.  She was seen by orthopedics and diagnosed with hip OA and Knee OA.  She received a hip injection and was going to return for possible knee injection in the future.  Her concern was being on Eliquis for Afib and getting injections.  She was going to check with her Cardiologist before going back to ortho Care.     Past Medical History:  Diagnosis Date   Arthritis    Atrial fibrillation (HCC)    Hyperlipemia    Hypothyroidism    Meniere's disease    Peripheral vascular disease (HCC)    Thyroid disease     Past Surgical History:  Procedure Laterality Date   ABDOMINAL HYSTERECTOMY     CARDIOVERSION N/A 03/21/2021   Procedure: CARDIOVERSION;  Surgeon: Thurmon Fair, MD;  Location: [facility] ENDOSCOPY;  Service: Cardiovascular;  Laterality: N/A;   CARDIOVERSION N/A 07/05/2022   Procedure: CARDIOVERSION;  Surgeon: Jodelle Red, MD;  Location: [facility] ENDOSCOPY;  Service: Cardiovascular;  Laterality: N/A;   ERCP Left 10/03/2014   Procedure:  ENDOSCOPIC RETROGRADE CHOLANGIOPANCREATOGRAPHY (ERCP);  Surgeon: Barrie Folk, MD;  Location: [facility] ENDOSCOPY;  Service: Gastroenterology;  Laterality: Left;   ERCP N/A 01/14/2015   Procedure: ENDOSCOPIC RETROGRADE CHOLANGIOPANCREATOGRAPHY (ERCP);  Surgeon: Petra Kuba, MD;  Location: [facility] ENDOSCOPY;  Service: Endoscopy;  Laterality: N/A;  need lithotripter/ordered/LH   SPHINCTEROTOMY  09/2014   SPYGLASS LITHOTRIPSY N/A 01/14/2015   Procedure: NWGNFAOZ LITHOTRIPSY;  Surgeon: Petra Kuba, MD;  Location: [facility] ENDOSCOPY;  Service: Endoscopy;  Laterality: N/A;    Social History   Socioeconomic History   Marital status: Married    Spouse name: Not on file   Number of children: Not on file   Years of education: Not on file   Highest education level: Not on file  Occupational History   Not on file  Tobacco Use   Smoking status: Never   Smokeless tobacco: Never  Vaping Use   Vaping status: Never Used  Substance and Sexual Activity   Alcohol use: No   Drug use: No   Sexual activity: Never  Other Topics Concern   Not on file  Social History Narrative   Not on file   Social Drivers of Health   Financial Resource Strain: Low Risk  (11/12/2022)   Overall Financial Resource Strain (CARDIA)    Difficulty of Paying Living Expenses: Not hard at all  Food Insecurity: No Food Insecurity (11/12/2022)  Hunger Vital Sign    Worried About Running Out of Food in the Last Year: Never true    Ran Out of Food in the Last Year: Never true  Transportation Needs: No Transportation Needs (11/12/2022)   PRAPARE - Administrator, Civil Service (Medical): No    Lack of Transportation (Non-Medical): No  Physical Activity: Sufficiently Active (11/12/2022)   Exercise Vital Sign    Days of Exercise per Week: 7 days    Minutes of Exercise per Session: 30 min  Stress: No Stress Concern Present (11/12/2022)   [employer] of Occupational Health - Occupational Stress Questionnaire    Feeling of  Stress : Not at all  Social Connections: Moderately Integrated (11/12/2022)   Social Connection and Isolation Panel [NHANES]    Frequency of Communication with Friends and Family: Once a week    Frequency of Social Gatherings with Friends and Family: More than three times a week    Attends Religious Services: 1 to 4 times per year    Active Member of [employer] or Organizations: No    Attends Banker Meetings: Never    Marital Status: Married  Catering manager Violence: Not At Risk (11/12/2022)   Humiliation, Afraid, Rape, and Kick questionnaire    Fear of Current or Ex-Partner: No    Emotionally Abused: No    Physically Abused: No    Sexually Abused: No    Family History  Problem Relation Age of Onset   Other Mother    Heart attack Mother    Other Father     Current Outpatient Medications on File Prior to Visit  Medication Sig Dispense Refill   acetaminophen (TYLENOL) 500 MG tablet Take 1 tablet (500 mg total) by mouth every 6 (six) hours as needed. 30 tablet 0   amiodarone (PACERONE) 200 MG tablet Take 1 tablet (200 mg total) by mouth daily. 90 tablet 3   diltiazem (CARDIZEM) 30 MG tablet TAKE 1 TABLET DAILY AS NEEDED FOR HEART RATE ABOVE 120 BEATS PER MINUTE (RAPID ATRIAL FIBRILLATION) 90 tablet 1   ELIQUIS 5 MG TABS tablet TAKE 1 TABLET BY MOUTH TWICE A DAY 180 tablet 1   levothyroxine (SYNTHROID) 88 MCG tablet TAKE 1 TABLET BY MOUTH EVERY DAY BEFORE BREAKFAST 90 tablet 1   Multiple Vitamin (MULTIVITAMIN WITH MINERALS) TABS tablet Take 2 tablets by mouth daily. Gummies     olopatadine (PATADAY) 0.1 % ophthalmic solution Place 1 drop into both eyes daily as needed for allergies.     Polyethyl Glycol-Propyl Glycol (SYSTANE OP) Place 1 drop into both eyes 2 (two) times daily as needed (dry eyes).     Potassium Gluconate 550 MG TABS Take 550 mg by mouth daily.     triamterene-hydrochlorothiazide (MAXZIDE-25) 37.5-25 MG tablet TAKE 1 TABLET BY MOUTH EVERY DAY 90 tablet 1    No current facility-administered medications on file prior to visit.    Allergies as of 01/22/2024 - Review Complete 01/22/2024  Allergen Reaction Noted   Ursodiol Other (See Comments) 01/13/2015   Rosuvastatin Other (See Comments) 06/21/2023     ROS:   General:  No weight loss, Fever, chills  HEENT: No recent headaches, no nasal bleeding, no visual changes, no sore throat  Neurologic: No dizziness, blackouts, seizures. No recent symptoms of stroke or mini- stroke. No recent episodes of slurred speech, or temporary blindness.  Cardiac: No recent episodes of chest pain/pressure, no shortness of breath at rest.  No shortness of breath with exertion.  Denies history of atrial fibrillation or irregular heartbeat  Vascular: No history of rest pain in feet.  No history of claudication.  No history of non-healing ulcer, No history of DVT   Pulmonary: No home oxygen, no productive cough, no hemoptysis,  No asthma or wheezing  Musculoskeletal:  [x ] Arthritis, [ ]  Low back pain,  [ x] Joint pain  Hematologic:No history of hypercoagulable state.  No history of easy bleeding.  No history of anemia  Gastrointestinal: No hematochezia or melena,  No gastroesophageal reflux, no trouble swallowing  Urinary: [ ]  chronic Kidney disease, [ ]  on HD - [ ]  MWF or [ ]  TTHS, [ ]  Burning with urination, [ ]  Frequent urination, [ ]  Difficulty urinating;   Skin: No rashes  Psychological: No history of anxiety,  No history of depression  Physical Examination  Vitals:   01/22/24 1251  BP: (!) 146/76  Pulse: 73  Resp: 18  Temp: 98 F (36.7 C)  TempSrc: Temporal  SpO2: 96%  Weight: 153 lb 12.8 oz (69.8 kg)  Height: 5\' 6"  (1.676 m)    Body mass index is 24.82 kg/m.  General:  Alert and oriented, no acute distress HEENT: Normal Neck: No bruit or JVD Pulmonary: Clear to auscultation bilaterally Cardiac: Regular Rate and Rhythm without murmur Abdomen: Soft, non-tender, non-distended, no  mass, no scars Skin: No rash Extremity Pulses: radial,  femoral, right popliteal, dorsalis pedis,  pulses bilaterally Musculoskeletal: No deformity or edema  Neurologic: Upper and lower extremity motor 5/5 and symmetric  DATA: +--------------+---------+------+-----------+------------+--------+  LEFT         Reflux NoRefluxReflux TimeDiameter cmsComments                          Yes                                   +--------------+---------+------+-----------+------------+--------+  CFV                    yes   >1 second                       +--------------+---------+------+-----------+------------+--------+  FV mid        no                                              +--------------+---------+------+-----------+------------+--------+  Popliteal    no                                              +--------------+---------+------+-----------+------------+--------+  GSV at SFJ              yes    >500 ms      .506              +--------------+---------+------+-----------+------------+--------+  GSV prox thighno                            .246              +--------------+---------+------+-----------+------------+--------+  GSV mid thigh no                            .  191              +--------------+---------+------+-----------+------------+--------+  GSV dist thighno                            .301              +--------------+---------+------+-----------+------------+--------+  GSV at knee   no                            .287              +--------------+---------+------+-----------+------------+--------+  GSV prox calf no                            .180              +--------------+---------+------+-----------+------------+--------+  SSV Pop Fossa no                            .282              +--------------+---------+------+-----------+------------+--------+  SSV prox calf no                             .207              +--------------+---------+------+-----------+------------+--------+         Summary:  Left:  - No evidence of deep vein thrombosis seen in the left lower extremity,  from the common femoral through the popliteal veins.  - No evidence of superficial venous thrombosis in the left lower  extremity.  - Venous reflux is noted in the left common femoral vein.  - Venous reflux is noted in the left sapheno-femoral junction.   Assessment/Plan: Left knee pain with aching during walking or at night when trying to rest. Her vein study today shows minimal reflux at the top of the left LE in the CFV and the SFJ only.  She denies edema and has no skin changes.  Minimal telangiectasias over the legs.    She has palpable pedal pulses and is not at risk of limb loss.  I think she should f/u with ortho for possible injection.   F/U as needed with our practice.       [facility] [place] Office: [phone] Pager: [phone]

## 2024-01-24 ENCOUNTER — Other Ambulatory Visit: Payer: Self-pay | Admitting: Physician Assistant

## 2024-01-27 ENCOUNTER — Telehealth (HOSPITAL_BASED_OUTPATIENT_CLINIC_OR_DEPARTMENT_OTHER): Payer: Self-pay | Admitting: Cardiology

## 2024-01-27 NOTE — Telephone Encounter (Signed)
Discussed with Dr Cristal Deer who feels injection less likely than oral steroids to cause issues however she can not certain.  No other recommendations for knee  Advised husband, verbalized understanding

## 2024-01-27 NOTE — Telephone Encounter (Signed)
New Message:     Patient have been having pain in her legs. She has joint problems in her knees and hip. They want to inject her with Steroids.. Patient has Afib, is it alright to have this injection?      Pt c/o medication issue:  1. Name of Medication: Steroids  2. How are you currently taking this medication (dosage and times per day)?   3. Are you having a reaction (difficulty breathing--STAT)?   4. What is your medication issue? Is it alright fior patient to take a Steroid shot since she has Afib?

## 2024-01-27 NOTE — Telephone Encounter (Signed)
Spoke with pt husband, patient is having pain in the lower leg below knee. X-rays showed very little cartilage in the hips and knees and the patient and husband are trying to figure out the best thing to do. Aware we can not know if the injection will make her go back into a fib or not. He is wanting to know if there are other options besides steroids. Advised we may not be able to determine other options as we do not deal with orthopedics, he would like me to ask if there are other solutions to her problem that may not cause atrial fib.

## 2024-02-04 DIAGNOSIS — M25561 Pain in right knee: Secondary | ICD-10-CM | POA: Diagnosis not present

## 2024-02-04 DIAGNOSIS — M25562 Pain in left knee: Secondary | ICD-10-CM | POA: Diagnosis not present

## 2024-02-08 ENCOUNTER — Other Ambulatory Visit: Payer: Self-pay | Admitting: Physician Assistant

## 2024-02-08 ENCOUNTER — Other Ambulatory Visit: Payer: Self-pay | Admitting: Cardiology

## 2024-02-08 DIAGNOSIS — I4891 Unspecified atrial fibrillation: Secondary | ICD-10-CM

## 2024-02-10 NOTE — Telephone Encounter (Signed)
 Prescription refill request for Eliquis received. Indication:afib Last office visit:6/24 Scr:1.03  8/24 Age: 83 Weight:69.8  kg  Prescription refilled

## 2024-02-13 DIAGNOSIS — M170 Bilateral primary osteoarthritis of knee: Secondary | ICD-10-CM | POA: Diagnosis not present

## 2024-02-20 DIAGNOSIS — M1712 Unilateral primary osteoarthritis, left knee: Secondary | ICD-10-CM | POA: Diagnosis not present

## 2024-02-27 DIAGNOSIS — M1712 Unilateral primary osteoarthritis, left knee: Secondary | ICD-10-CM | POA: Diagnosis not present

## 2024-03-06 ENCOUNTER — Encounter (HOSPITAL_BASED_OUTPATIENT_CLINIC_OR_DEPARTMENT_OTHER): Payer: Self-pay | Admitting: Physical Therapy

## 2024-03-06 ENCOUNTER — Ambulatory Visit (HOSPITAL_BASED_OUTPATIENT_CLINIC_OR_DEPARTMENT_OTHER): Payer: Medicare Other | Attending: Orthopedic Surgery | Admitting: Physical Therapy

## 2024-03-06 ENCOUNTER — Other Ambulatory Visit: Payer: Self-pay

## 2024-03-06 DIAGNOSIS — M6281 Muscle weakness (generalized): Secondary | ICD-10-CM | POA: Diagnosis not present

## 2024-03-06 DIAGNOSIS — M170 Bilateral primary osteoarthritis of knee: Secondary | ICD-10-CM | POA: Insufficient documentation

## 2024-03-06 DIAGNOSIS — M25562 Pain in left knee: Secondary | ICD-10-CM | POA: Insufficient documentation

## 2024-03-06 DIAGNOSIS — M25561 Pain in right knee: Secondary | ICD-10-CM | POA: Insufficient documentation

## 2024-03-06 DIAGNOSIS — R2689 Other abnormalities of gait and mobility: Secondary | ICD-10-CM | POA: Insufficient documentation

## 2024-03-06 NOTE — Therapy (Addendum)
 OUTPATIENT PHYSICAL THERAPY LOWER EXTREMITY EVALUATION   Patient Name: Paige Levy MRN: [id number] DOB:1941/03/28, 83 y.o., female Today's Date: 03/06/2024  END OF SESSION:  PT End of Session - 03/06/24 1304     Visit Number 1    Number of Visits 17    Date for PT Re-Evaluation 05/01/24    Authorization Type [employer] medicare    Progress Note Due on Visit 10    PT Start Time 1304    PT Stop Time 1342    PT Time Calculation (min) 38 min    Activity Tolerance Patient tolerated treatment well             Past Medical History:  Diagnosis Date   Arthritis    Atrial fibrillation (HCC)    Hyperlipemia    Hypothyroidism    Meniere's disease    Peripheral vascular disease (HCC)    Thyroid disease    Past Surgical History:  Procedure Laterality Date   ABDOMINAL HYSTERECTOMY     CARDIOVERSION N/A 03/21/2021   Procedure: CARDIOVERSION;  Surgeon: Thurmon Fair, MD;  Location: [facility] ENDOSCOPY;  Service: Cardiovascular;  Laterality: N/A;   CARDIOVERSION N/A 07/05/2022   Procedure: CARDIOVERSION;  Surgeon: Jodelle Red, MD;  Location: [facility] ENDOSCOPY;  Service: Cardiovascular;  Laterality: N/A;   ERCP Left 10/03/2014   Procedure: ENDOSCOPIC RETROGRADE CHOLANGIOPANCREATOGRAPHY (ERCP);  Surgeon: Barrie Folk, MD;  Location: [facility] ENDOSCOPY;  Service: Gastroenterology;  Laterality: Left;   ERCP N/A 01/14/2015   Procedure: ENDOSCOPIC RETROGRADE CHOLANGIOPANCREATOGRAPHY (ERCP);  Surgeon: Petra Kuba, MD;  Location: [facility] ENDOSCOPY;  Service: Endoscopy;  Laterality: N/A;  need lithotripter/ordered/LH   SPHINCTEROTOMY  09/2014   SPYGLASS LITHOTRIPSY N/A 01/14/2015   Procedure: WUJWJXBJ LITHOTRIPSY;  Surgeon: Petra Kuba, MD;  Location: [facility] ENDOSCOPY;  Service: Endoscopy;  Laterality: N/A;   Patient Active Problem List   Diagnosis Date Noted   Osteopenia 10/30/2023   Neoplasm of uncertain behavior of skin 08/28/2022   Secondary hypercoagulable state (HCC) 03/29/2021   Current use of  long term anticoagulation 03/29/2021   Essential hypertension 03/29/2021   Pure hypercholesterolemia 03/29/2021   Atrial fibrillation (HCC) 02/08/2021   Pulsatile tinnitus of right ear 01/01/2020   Submandibular lymphadenopathy 11/18/2019   Chronic rhinitis 04/27/2016   Hearing loss 04/27/2016   Active cochlear Meniere's disease, bilateral 01/17/2015   Meniere's disease 10/01/2014   Dyslipidemia 10/01/2014   Hypothyroidism 11/09/2013   Osteoarthritis 11/09/2013   Hyperlipidemia 11/09/2013    PCP: Deeann Saint, MD  REFERRING PROVIDER: Joen Laura, MD  REFERRING DIAG: Bilateral knee OA  THERAPY DIAG:  Pain in both knees, unspecified chronicity  Other abnormalities of gait and mobility  Muscle weakness (generalized)  Rationale for Evaluation and Treatment: Rehabilitation  ONSET DATE: summer 2024  SUBJECTIVE:   SUBJECTIVE STATEMENT: Pt states L knee is her primary issue. BIL knees at times. No recalled MOI or change in activity. Seems to be slowly worsening over time. Symptoms are variable. Pt states she will often have pain at the end of the day regardless of activity levels. Has received gel injection in L knee with some relief. At one point was having pain down the leg but now localized more so to knee. No n/t, denies any significant issues with swelling. Did have [place] to look at circulation issues, states it went well. Typically walks the dog with her husband, has had to reduce activity. Pt accompanied by spouse per her request.   PERTINENT HISTORY: afib, PVD, thyroid disease, osteopenia  PAIN:  Are you having pain: 6-7/10 aching Location/description: BIL knee, aching, L > R - aggravating factors: standing  - Easing factors: injections, compression socks     PRECAUTIONS: None  RED FLAGS: None   WEIGHT BEARING RESTRICTIONS: No  FALLS:  Has patient fallen in last 6 months? 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  DIAGNOSTIC FINDINGS:  11/29/23 BIL knee XR without fracture or dislocations per chart review, please refer to EPIC for details  PATIENT SURVEYS:  LEFS score not recorded - some answers deferred  COGNITION: Overall cognitive status: Within functional limits for tasks assessed     SENSATION: Does not endorse any sensory complaints  EDEMA:  Mild pocket of swelling anterolateral knees BIL, L more so than R    PALPATION: Tenderness about L knee joint, more so anterolaterally  LOWER EXTREMITY ROM:     Active  Right eval Left eval  Hip flexion    Hip extension    Hip internal rotation    Hip external rotation    Knee extension    Knee flexion 119 deg 104 deg *  (Blank rows = not tested) (Key: WFL = within functional limits not formally assessed, * = concordant pain, s = stiffness/stretching sensation, NT = not tested)  Comments:    LOWER EXTREMITY MMT:    MMT Right eval Left eval  Hip flexion 4 4- *  Hip abduction (modified sitting) 4 4  Hip internal rotation    Hip external rotation    Knee flexion 4 3+  Knee extension 4 4-  Ankle dorsiflexion 4 4   (Blank rows = not tested) (Key: WFL = within functional limits not formally assessed, * = concordant pain, s = stiffness/stretching sensation, NT = not tested)  Comments:    FUNCTIONAL TESTS:  5xSTS: 13.90sec w/ UE support at thighs, L knee pain, tendency towards posterior LOB  GAIT: Distance walked: within clinic Assistive device utilized: None Level of assistance: Complete Independence Comments: mild antalgic gait LLE                                                                                                                                 TREATMENT DATE:  [facility] Adult PT Treatment:                                                DATE: 03/06/24 Therapeutic Exercise: Practice reps quads, heel slides, and STS w/ education on setup, home performance, appropriate mechanics/safety ; HEP handout provided  PATIENT EDUCATION:  Education details: Pt education on PT impairments, prognosis, and POC. Informed consent. Rationale for interventions, safe/appropriate HEP performance Person educated: Patient Education method: Explanation, Demonstration, Tactile cues, Verbal cues Education comprehension: verbalized understanding, returned demonstration, verbal cues required, tactile cues required, and needs further education    HOME EXERCISE PROGRAM: Access Code: [email or web address] URL: [email or web address] Date: 03/06/2024 Prepared by: Fransisco Hertz  Exercises - Seated Quad Set  - 2-3 x daily - 1 sets - 8 reps - Seated Heel Slide  - 2-3 x daily - 1 sets - 8 reps - Sit to Stand with Armchair  - 2-3 x daily - 1 sets - 5 reps  ASSESSMENT:  CLINICAL IMPRESSION: Patient is a 83 y.o. woman who was seen today for physical therapy evaluation and treatment for BIL knee pain, L>R that is reportedly limiting tolerance to basic mobility, reducing overall activity levels. On exam pt demonstrates concordant limitations in LE strength, quad activation, and knee mobility, all of which are likely contributing to symptoms and observed alterations in gait/transfer mechanics. Pt tolerates exam/HEP well without adverse event or increase in resting pain. Recommend trial of skilled PT to address aforementioned deficits with aim of improving functional tolerance and reducing pain with typical activities. Pt departs today's session in no acute distress, all voiced concerns/questions addressed appropriately from PT perspective.    OBJECTIVE IMPAIRMENTS: Abnormal gait, decreased activity tolerance, decreased balance, decreased endurance, decreased mobility,  difficulty walking, decreased ROM, decreased strength, improper body mechanics, and pain.   ACTIVITY LIMITATIONS: carrying, lifting, standing, squatting, sleeping, stairs, transfers, and locomotion level  PARTICIPATION LIMITATIONS: meal prep, cleaning, laundry, and community activity  PERSONAL FACTORS: Age, Time since onset of injury/illness/exacerbation, and 3+ comorbidities: afib, PVD, thyroid disease, osteopenia  are also affecting patient's functional outcome.   REHAB POTENTIAL: Good  CLINICAL DECISION MAKING: Evolving/moderate complexity  EVALUATION COMPLEXITY: Moderate   GOALS:  SHORT TERM GOALS: Target date: 04/03/2024  Pt will demonstrate appropriate understanding and performance of initially prescribed HEP in order to facilitate improved independence with management of symptoms.  Baseline: HEP established  Goal status: INITIAL   2. Pt will report at least 25% improvement in overall pain levels over past week in order to facilitate improved tolerance to typical daily activities.   Baseline: 6-7/10 on eval  Goal status: INITIAL    LONG TERM GOALS: Target date: 05/01/2024  Pt will report at least 50% decrease in overall pain levels in past week in order to facilitate improved tolerance to basic ADLs/mobility.   Baseline: 6-7/10 oneval  Goal status: INITIAL    2.  Pt will demonstrate at least 0-120 degrees of knee AROM BIL in order to facilitate improved tolerance to functional movements such as squatting/transfers.  Baseline: see ROM chart above Goal status: INITIAL  3.  Pt will demonstrate at least 4+/5 knee flex/ext MMT BIL in order to facilitate improved functional strength. Baseline: see MMT chart above Goal status: INITIAL  4.  Pt will be able to perform 5xSTS in less than or equal to 10sec in order to demonstrate reduced fall risk and improved functional independence (MCID 5xSTS = 2.3 sec). Baseline: 13.9 sec, UE support, pain, and altered mechanics Goal status:  INITIAL   5. Pt will demonstrate appropriate performance of final prescribed HEP in order to facilitate improved self-management of symptoms post-discharge.   Baseline: initial HEP prescribed  Goal status: INITIAL    6. Pt will endorse ability to walk her dog with her husband for at least 20  min with less than 3pt increase in knee pain in order to facilitate improved tolerance to community navigation.   Baseline: no longer performing  Goal status: INITIAL   PLAN:  PT FREQUENCY: 1-2x/week  PT DURATION: 8 weeks  PLANNED INTERVENTIONS: 97164- PT Re-evaluation, 97110-Therapeutic exercises, 97530- Therapeutic activity, 97112- Neuromuscular re-education, 97535- Self Care, 78295- Manual therapy, [phone]- Gait training, Patient/Family education, Balance training, Stair training, Taping, Dry Needling, Joint mobilization, Cryotherapy, and Moist heat  PLAN FOR NEXT SESSION: Review/update HEP PRN. Work on [employer] exercises as appropriate with emphasis on quad activation, knee flex mobility, functional mechanics. Symptom modification strategies as indicated/appropriate.    Ashley Murrain PT, DPT 03/06/2024 4:30 PM    Addendum for [facility] Berkley Harvey Ashley Murrain PT, DPT 03/10/2024 9:05 AM    Date of referral: 02/17/24 Referring provider: Joen Laura, MD Referring diagnosis? Bilateral knee OA Treatment diagnosis? (if different than referring diagnosis) Pain in both knees, unspecified chronicity   Other abnormalities of gait and mobility   Muscle weakness (generalized)  What was this (referring dx) caused by? Ongoing Issue  Ashby Dawes of Condition: Chronic (continuous duration > 3 months)   Laterality: Both  Current Functional Measure Score: Other 5xSTS 13.9sec with UE support ; LEFS taken, unable to be fully scored  Objective measurements identify impairments when they are compared to normal values, the uninvolved extremity, and prior level of function.  [x]  Yes  []   No  Objective assessment of functional ability: Minimal functional limitations   Briefly describe symptoms: BIL knee pain, L>R worsening with activity, particularly weightbearing tasks  How did symptoms start: gradual onset  Average pain intensity:  Last 24 hours: 6-7/10  Past week: NA  How often does the pt experience symptoms? Frequently  How much have the symptoms interfered with usual daily activities? Moderately  How has condition changed since care began at this facility? NA - initial visit  In general, how is the patients overall health? Good   BACK PAIN (STarT Back Screening Tool) No

## 2024-03-10 ENCOUNTER — Encounter (HOSPITAL_BASED_OUTPATIENT_CLINIC_OR_DEPARTMENT_OTHER): Payer: Self-pay | Admitting: Physical Therapy

## 2024-03-10 ENCOUNTER — Ambulatory Visit (HOSPITAL_BASED_OUTPATIENT_CLINIC_OR_DEPARTMENT_OTHER): Admitting: Physical Therapy

## 2024-03-10 DIAGNOSIS — M25562 Pain in left knee: Secondary | ICD-10-CM | POA: Diagnosis not present

## 2024-03-10 DIAGNOSIS — M6281 Muscle weakness (generalized): Secondary | ICD-10-CM | POA: Diagnosis not present

## 2024-03-10 DIAGNOSIS — M25561 Pain in right knee: Secondary | ICD-10-CM | POA: Diagnosis not present

## 2024-03-10 DIAGNOSIS — R2689 Other abnormalities of gait and mobility: Secondary | ICD-10-CM

## 2024-03-10 DIAGNOSIS — M170 Bilateral primary osteoarthritis of knee: Secondary | ICD-10-CM | POA: Diagnosis not present

## 2024-03-10 NOTE — Therapy (Signed)
 OUTPATIENT PHYSICAL THERAPY TREATMENT   Patient Name: TALEA MANGES MRN: [id number] DOB:Jun 05, 1941, 83 y.o., female Today's Date: 03/10/2024  END OF SESSION:  PT End of Session - 03/10/24 0847     Visit Number 2    Number of Visits 17    Date for PT Re-Evaluation 05/01/24    Authorization Type [employer] medicare    Progress Note Due on Visit 10    PT Start Time 0847    PT Stop Time 0925    PT Time Calculation (min) 38 min    Activity Tolerance Patient tolerated treatment well             Past Medical History:  Diagnosis Date   Arthritis    Atrial fibrillation (HCC)    Hyperlipemia    Hypothyroidism    Meniere's disease    Peripheral vascular disease (HCC)    Thyroid disease    Past Surgical History:  Procedure Laterality Date   ABDOMINAL HYSTERECTOMY     CARDIOVERSION N/A 03/21/2021   Procedure: CARDIOVERSION;  Surgeon: Thurmon Fair, MD;  Location: [facility] ENDOSCOPY;  Service: Cardiovascular;  Laterality: N/A;   CARDIOVERSION N/A 07/05/2022   Procedure: CARDIOVERSION;  Surgeon: Jodelle Red, MD;  Location: [facility] ENDOSCOPY;  Service: Cardiovascular;  Laterality: N/A;   ERCP Left 10/03/2014   Procedure: ENDOSCOPIC RETROGRADE CHOLANGIOPANCREATOGRAPHY (ERCP);  Surgeon: Barrie Folk, MD;  Location: [facility] ENDOSCOPY;  Service: Gastroenterology;  Laterality: Left;   ERCP N/A 01/14/2015   Procedure: ENDOSCOPIC RETROGRADE CHOLANGIOPANCREATOGRAPHY (ERCP);  Surgeon: Petra Kuba, MD;  Location: [facility] ENDOSCOPY;  Service: Endoscopy;  Laterality: N/A;  need lithotripter/ordered/LH   SPHINCTEROTOMY  09/2014   SPYGLASS LITHOTRIPSY N/A 01/14/2015   Procedure: WUJWJXBJ LITHOTRIPSY;  Surgeon: Petra Kuba, MD;  Location: [facility] ENDOSCOPY;  Service: Endoscopy;  Laterality: N/A;   Patient Active Problem List   Diagnosis Date Noted   Osteopenia 10/30/2023   Neoplasm of uncertain behavior of skin 08/28/2022   Secondary hypercoagulable state (HCC) 03/29/2021   Current use of long term  anticoagulation 03/29/2021   Essential hypertension 03/29/2021   Pure hypercholesterolemia 03/29/2021   Atrial fibrillation (HCC) 02/08/2021   Pulsatile tinnitus of right ear 01/01/2020   Submandibular lymphadenopathy 11/18/2019   Chronic rhinitis 04/27/2016   Hearing loss 04/27/2016   Active cochlear Meniere's disease, bilateral 01/17/2015   Meniere's disease 10/01/2014   Dyslipidemia 10/01/2014   Hypothyroidism 11/09/2013   Osteoarthritis 11/09/2013   Hyperlipidemia 11/09/2013    PCP: Deeann Saint, MD  REFERRING PROVIDER: Joen Laura, MD  REFERRING DIAG: Bilateral knee OA  THERAPY DIAG:  Pain in both knees, unspecified chronicity  Other abnormalities of gait and mobility  Muscle weakness (generalized)  Rationale for Evaluation and Treatment: Rehabilitation  ONSET DATE: summer 2024  SUBJECTIVE:   SUBJECTIVE STATEMENT: Patient states HEP going well. Been doing sitting and standing exercise.    EVAL: Pt states L knee is her primary issue. BIL knees at times. No recalled MOI or change in activity. Seems to be slowly worsening over time. Symptoms are variable. Pt states she will often have pain at the end of the day regardless of activity levels. Has received gel injection in L knee with some relief. At one point was having pain down the leg but now localized more so to knee. No n/t, denies any significant issues with swelling. Did have [place] to look at circulation issues, states it went well. Typically walks the dog with her husband, has had to reduce activity. Pt accompanied by  spouse per her request.   PERTINENT HISTORY: afib, PVD, thyroid disease, osteopenia  PAIN:  Are you having pain: 6-7/10 aching Location/description: BIL knee, aching, L > R - aggravating factors: standing  - Easing factors: injections, compression socks     PRECAUTIONS: None  RED FLAGS: None   WEIGHT BEARING RESTRICTIONS: No  FALLS:  Has patient fallen in last 6 months?  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  DIAGNOSTIC FINDINGS:  11/29/23 BIL knee XR without fracture or dislocations per chart review, please refer to EPIC for details  PATIENT SURVEYS:  LEFS score not recorded - some answers deferred  COGNITION: Overall cognitive status: Within functional limits for tasks assessed     SENSATION: Does not endorse any sensory complaints  EDEMA:  Mild pocket of swelling anterolateral knees BIL, L more so than R    PALPATION: Tenderness about L knee joint, more so anterolaterally  LOWER EXTREMITY ROM:     Active  Right eval Left eval  Hip flexion    Hip extension    Hip internal rotation    Hip external rotation    Knee extension    Knee flexion 119 deg 104 deg *  (Blank rows = not tested) (Key: WFL = within functional limits not formally assessed, * = concordant pain, s = stiffness/stretching sensation, NT = not tested)  Comments:    LOWER EXTREMITY MMT:    MMT Right eval Left eval  Hip flexion 4 4- *  Hip abduction (modified sitting) 4 4  Hip internal rotation    Hip external rotation    Knee flexion 4 3+  Knee extension 4 4-  Ankle dorsiflexion 4 4   (Blank rows = not tested) (Key: WFL = within functional limits not formally assessed, * = concordant pain, s = stiffness/stretching sensation, NT = not tested)  Comments:    FUNCTIONAL TESTS:  5xSTS: 13.90sec w/ UE support at thighs, L knee pain, tendency towards posterior LOB  GAIT: Distance walked: within clinic Assistive device utilized: None Level of assistance: Complete Independence Comments: mild antalgic gait LLE                                                                                                                                 TREATMENT DATE:  03/10/24 Quad set 10 x 10 second holds Heel slides 10 x 5-10 second holds on L SLR 1 x 10  Sidelying clam 2 x 10 LAQ 1 x 10 x 5 second holds STS 2 x 5 Standing hamstring curls 3 x 10   [facility] Adult PT Treatment:  DATE: 03/06/24 Therapeutic Exercise: Practice reps quads, heel slides, and STS w/ education on setup, home performance, appropriate mechanics/safety ; HEP handout provided    PATIENT EDUCATION:  Education details: Pt education on PT impairments, prognosis, and POC. Informed consent. Rationale for interventions, safe/appropriate HEP performance 03/10/24: HEP Person educated: Patient Education method: Explanation, Demonstration, Tactile cues, Verbal cues Education comprehension: verbalized understanding, returned demonstration, verbal cues required, tactile cues required, and needs further education    HOME EXERCISE PROGRAM: Access Code: [email or web address] URL: [email or web address] Date: 03/06/2024 - Seated Quad Set  - 2-3 x daily - 1 sets - 8 reps - Seated Heel Slide  - 2-3 x daily - 1 sets - 8 reps - Sit to Stand with Armchair  - 2-3 x daily - 1 sets - 5 reps  03/10/24 - Seated Long Arc Quad  - 3 x daily - 7 x weekly - 10 reps - 5 second hold  ASSESSMENT:  CLINICAL IMPRESSION: Began session with previously provided HEP exercises and patient requires cueing for positioning and mechanics. Additional quad and glute strengthening performed well with mild fatigue. Patient will continue to benefit from physical therapy in order to improve function and reduce impairment.    EVAL: Patient is a 83 y.o. woman who was seen today for physical therapy evaluation and treatment for BIL knee pain, L>R that is reportedly limiting tolerance to basic mobility, reducing overall activity levels. On exam pt demonstrates concordant limitations in LE strength,  quad activation, and knee mobility, all of which are likely contributing to symptoms and observed alterations in gait/transfer mechanics. Pt tolerates exam/HEP well without adverse event or increase in resting pain. Recommend trial of skilled PT to address aforementioned deficits with aim of improving functional tolerance and reducing pain with typical activities. Pt departs today's session in no acute distress, all voiced concerns/questions addressed appropriately from PT perspective.    OBJECTIVE IMPAIRMENTS: Abnormal gait, decreased activity tolerance, decreased balance, decreased endurance, decreased mobility, difficulty walking, decreased ROM, decreased strength, improper body mechanics, and pain.   ACTIVITY LIMITATIONS: carrying, lifting, standing, squatting, sleeping, stairs, transfers, and locomotion level  PARTICIPATION LIMITATIONS: meal prep, cleaning, laundry, and community activity  PERSONAL FACTORS: Age, Time since onset of injury/illness/exacerbation, and 3+ comorbidities: afib, PVD, thyroid disease, osteopenia  are also affecting patient's functional outcome.   REHAB POTENTIAL: Good  CLINICAL DECISION MAKING: Evolving/moderate complexity  EVALUATION COMPLEXITY: Moderate   GOALS:  SHORT TERM GOALS: Target date: 04/03/2024  Pt will demonstrate appropriate understanding and performance of initially prescribed HEP in order to facilitate improved independence with management of symptoms.  Baseline: HEP established  Goal status: INITIAL   2. Pt will report at least 25% improvement in overall pain levels over past week in order to facilitate improved tolerance to typical daily activities.   Baseline: 6-7/10 on eval  Goal status: INITIAL    LONG TERM GOALS: Target date: 05/01/2024  Pt will report at least 50% decrease in overall pain levels in past week in order to facilitate improved tolerance to basic ADLs/mobility.   Baseline: 6-7/10 oneval  Goal status: INITIAL    2.  Pt  will demonstrate at least 0-120 degrees of knee AROM BIL in order to facilitate improved tolerance to functional movements such as squatting/transfers.  Baseline: see ROM chart above Goal status: INITIAL  3.  Pt will demonstrate at least 4+/5 knee flex/ext MMT BIL in order to facilitate improved functional strength. Baseline: see MMT chart above Goal status: INITIAL  4.  Pt will  be able to perform 5xSTS in less than or equal to 10sec in order to demonstrate reduced fall risk and improved functional independence (MCID 5xSTS = 2.3 sec). Baseline: 13.9 sec, UE support, pain, and altered mechanics Goal status: INITIAL   5. Pt will demonstrate appropriate performance of final prescribed HEP in order to facilitate improved self-management of symptoms post-discharge.   Baseline: initial HEP prescribed  Goal status: INITIAL    6. Pt will endorse ability to walk her dog with her husband for at least 20 min with less than 3pt increase in knee pain in order to facilitate improved tolerance to community navigation.   Baseline: no longer performing  Goal status: INITIAL   PLAN:  PT FREQUENCY: 1-2x/week  PT DURATION: 8 weeks  PLANNED INTERVENTIONS: 97164- PT Re-evaluation, 97110-Therapeutic exercises, 97530- Therapeutic activity, 97112- Neuromuscular re-education, 97535- Self Care, 16109- Manual therapy, [phone]- Gait training, Patient/Family education, Balance training, Stair training, Taping, Dry Needling, Joint mobilization, Cryotherapy, and Moist heat  PLAN FOR NEXT SESSION: Review/update HEP PRN. Work on [employer] exercises as appropriate with emphasis on quad activation, knee flex mobility, functional mechanics. Symptom modification strategies as indicated/appropriate.     Reola Mosher Sebastian Dzik, PT 03/10/2024, 8:48 AM

## 2024-03-17 ENCOUNTER — Ambulatory Visit (HOSPITAL_BASED_OUTPATIENT_CLINIC_OR_DEPARTMENT_OTHER): Admitting: Physical Therapy

## 2024-03-20 ENCOUNTER — Encounter (HOSPITAL_BASED_OUTPATIENT_CLINIC_OR_DEPARTMENT_OTHER): Payer: Self-pay | Admitting: Physical Therapy

## 2024-03-20 ENCOUNTER — Ambulatory Visit (HOSPITAL_BASED_OUTPATIENT_CLINIC_OR_DEPARTMENT_OTHER): Admitting: Physical Therapy

## 2024-03-20 DIAGNOSIS — M6281 Muscle weakness (generalized): Secondary | ICD-10-CM

## 2024-03-20 DIAGNOSIS — R2689 Other abnormalities of gait and mobility: Secondary | ICD-10-CM | POA: Diagnosis not present

## 2024-03-20 DIAGNOSIS — M25562 Pain in left knee: Secondary | ICD-10-CM | POA: Diagnosis not present

## 2024-03-20 DIAGNOSIS — M25561 Pain in right knee: Secondary | ICD-10-CM | POA: Diagnosis not present

## 2024-03-20 DIAGNOSIS — M170 Bilateral primary osteoarthritis of knee: Secondary | ICD-10-CM | POA: Diagnosis not present

## 2024-03-20 NOTE — Therapy (Signed)
 OUTPATIENT PHYSICAL THERAPY TREATMENT   Patient Name: Paige Levy MRN: [id number] DOB:07-28-1941, 83 y.o., female Today's Date: 03/20/2024  END OF SESSION:  PT End of Session - 03/20/24 1433     Visit Number 3    Number of Visits 17    Date for PT Re-Evaluation 05/01/24    Authorization Type [employer] medicare    Progress Note Due on Visit 10    PT Start Time 1433    PT Stop Time 1512    PT Time Calculation (min) 39 min    Activity Tolerance Patient tolerated treatment well              Past Medical History:  Diagnosis Date   Arthritis    Atrial fibrillation (HCC)    Hyperlipemia    Hypothyroidism    Meniere's disease    Peripheral vascular disease (HCC)    Thyroid disease    Past Surgical History:  Procedure Laterality Date   ABDOMINAL HYSTERECTOMY     CARDIOVERSION N/A 03/21/2021   Procedure: CARDIOVERSION;  Surgeon: Thurmon Fair, MD;  Location: [facility] ENDOSCOPY;  Service: Cardiovascular;  Laterality: N/A;   CARDIOVERSION N/A 07/05/2022   Procedure: CARDIOVERSION;  Surgeon: Jodelle Red, MD;  Location: [facility] ENDOSCOPY;  Service: Cardiovascular;  Laterality: N/A;   ERCP Left 10/03/2014   Procedure: ENDOSCOPIC RETROGRADE CHOLANGIOPANCREATOGRAPHY (ERCP);  Surgeon: Barrie Folk, MD;  Location: [facility] ENDOSCOPY;  Service: Gastroenterology;  Laterality: Left;   ERCP N/A 01/14/2015   Procedure: ENDOSCOPIC RETROGRADE CHOLANGIOPANCREATOGRAPHY (ERCP);  Surgeon: Petra Kuba, MD;  Location: [facility] ENDOSCOPY;  Service: Endoscopy;  Laterality: N/A;  need lithotripter/ordered/LH   SPHINCTEROTOMY  09/2014   SPYGLASS LITHOTRIPSY N/A 01/14/2015   Procedure: FAOZHYQM LITHOTRIPSY;  Surgeon: Petra Kuba, MD;  Location: [facility] ENDOSCOPY;  Service: Endoscopy;  Laterality: N/A;   Patient Active Problem List   Diagnosis Date Noted   Osteopenia 10/30/2023   Neoplasm of uncertain behavior of skin 08/28/2022   Secondary hypercoagulable state (HCC) 03/29/2021   Current use of long term  anticoagulation 03/29/2021   Essential hypertension 03/29/2021   Pure hypercholesterolemia 03/29/2021   Atrial fibrillation (HCC) 02/08/2021   Pulsatile tinnitus of right ear 01/01/2020   Submandibular lymphadenopathy 11/18/2019   Chronic rhinitis 04/27/2016   Hearing loss 04/27/2016   Active cochlear Meniere's disease, bilateral 01/17/2015   Meniere's disease 10/01/2014   Dyslipidemia 10/01/2014   Hypothyroidism 11/09/2013   Osteoarthritis 11/09/2013   Hyperlipidemia 11/09/2013    PCP: Deeann Saint, MD  REFERRING PROVIDER: Joen Laura, MD  REFERRING DIAG: Bilateral knee OA  THERAPY DIAG:  Pain in both knees, unspecified chronicity  Other abnormalities of gait and mobility  Muscle weakness (generalized)  Rationale for Evaluation and Treatment: Rehabilitation  ONSET DATE: summer 2024  SUBJECTIVE:   SUBJECTIVE STATEMENT: 03/20/2024 Pt states she had to cancel last week with allergies. Feels her knees aren't giving her as much trouble as when she first started. Feels "aware of it" for her knee but not as painful as it was. Still having more morning stiffness. No issues with HEP, has been trying to walk more    EVAL: Pt states L knee is her primary issue. BIL knees at times. No recalled MOI or change in activity. Seems to be slowly worsening over time. Symptoms are variable. Pt states she will often have pain at the end of the day regardless of activity levels. Has received gel injection in L knee with some relief. At one point was having pain down  the leg but now localized more so to knee. No n/t, denies any significant issues with swelling. Did have [place] to look at circulation issues, states it went well. Typically walks the dog with her husband, has had to reduce activity. Pt accompanied by spouse per her request.   PERTINENT HISTORY: afib, PVD, thyroid disease, osteopenia  PAIN:  Are you having pain: "aware of it" but not painful Location/description: BIL  knee, aching, L > R - aggravating factors: standing  - Easing factors: injections, compression socks     PRECAUTIONS: None  RED FLAGS: None   WEIGHT BEARING RESTRICTIONS: No  FALLS:  Has patient fallen in last 6 months? 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  DIAGNOSTIC FINDINGS:  11/29/23 BIL knee XR without fracture or dislocations per chart review, please refer to EPIC for details  PATIENT SURVEYS:  LEFS score not recorded - some answers deferred  COGNITION: Overall cognitive status: Within functional limits for tasks assessed     SENSATION: Does not endorse any sensory complaints  EDEMA:  Mild pocket of swelling anterolateral knees BIL, L more so than R    PALPATION: Tenderness about L knee joint, more so anterolaterally  LOWER EXTREMITY ROM:     Active  Right eval Left eval  Hip flexion    Hip extension    Hip internal rotation    Hip external rotation    Knee extension    Knee flexion 119 deg 104 deg *  (Blank rows = not tested) (Key: WFL = within functional limits not formally assessed, * = concordant pain, s = stiffness/stretching sensation, NT = not tested)  Comments:    LOWER EXTREMITY MMT:    MMT Right eval Left eval  Hip flexion 4 4- *  Hip abduction (modified sitting) 4 4  Hip internal rotation    Hip external rotation    Knee flexion 4 3+  Knee extension 4 4-  Ankle dorsiflexion 4 4   (Blank rows = not tested) (Key: WFL = within functional limits not formally assessed, * = concordant pain, s = stiffness/stretching sensation, NT = not tested)  Comments:    FUNCTIONAL TESTS:  5xSTS: 13.90sec w/ UE support at thighs, L knee pain, tendency  towards posterior LOB  GAIT: Distance walked: within clinic Assistive device utilized: None Level of assistance: Complete Independence Comments: mild antalgic gait LLE                                                                                                                                TREATMENT DATE:  [facility] Adult PT Treatment:  DATE: 03/20/24 Therapeutic Exercise: LAQ 2x8 BIL cues for full ROM and foot positioning Hip 45 deg kickback standing 2x8 BIL w/ UE support Standing hamstring curl x10 BIL requires cues for pacing Heel/toe raises, standing w/ UE support x12 BIL  HEP discussion/education and rationale for interventions   Neuromuscular re-ed: Quad set 10sec hold x12 BIL tactile cues for quad activation STS 3x5 from mat, emphasis on quad activation and mitigating posterior weight shift  TKE push into bosu for quad activation 2x10 BIL     03/10/24 Quad set 10 x 10 second holds Heel slides 10 x 5-10 second holds on L SLR 1 x 10  Sidelying clam 2 x 10 LAQ 1 x 10 x 5 second holds STS 2 x 5 Standing hamstring curls 3 x 10   [facility] Adult PT Treatment:                                                DATE: 03/06/24 Therapeutic Exercise: Practice reps quads, heel slides, and STS w/ education on setup, home performance, appropriate mechanics/safety ; HEP handout provided    PATIENT EDUCATION:  Education details: rationale for interventions, HEP  Person educated: Patient Education method: Explanation, Demonstration, Tactile cues, Verbal cues Education comprehension: verbalized understanding, returned demonstration, verbal cues required, tactile cues required, and needs further education     HOME EXERCISE PROGRAM: Access Code: [email or web address] URL: [email or web address] Date: 03/06/2024 - Seated Quad Set  - 2-3 x daily - 1 sets - 8 reps - Seated Heel Slide  - 2-3 x daily - 1 sets - 8 reps - Sit to Stand with Armchair   - 2-3 x daily - 1 sets - 5 reps  03/10/24 - Seated Long Arc Quad  - 3 x daily - 7 x weekly - 10 reps - 5 second hold  ASSESSMENT:  CLINICAL IMPRESSION: 03/20/2024 Pt arrives w/ report of improving severity of symptoms, increased walking at home. Today continuing to build volume with quad focusing program, both in open and closed chain. Tolerates activity well with some fatigue/tightness but no increase in pain. Increased time spent in standing today which pt tolerates well. No adverse events, no increase in pain on departure. Recommend continuing along current POC in order to address relevant deficits and improve functional tolerance. Pt departs today's session in no acute distress, all voiced questions/concerns addressed appropriately from PT perspective.     EVAL: Patient is a 83 y.o. woman who was seen today for physical therapy evaluation and treatment for BIL knee pain, L>R that is reportedly limiting tolerance to basic mobility, reducing overall activity levels. On exam pt demonstrates concordant limitations in LE strength, quad activation, and knee mobility, all of which are likely contributing to symptoms and observed alterations in gait/transfer mechanics. Pt tolerates exam/HEP well without adverse event or increase in resting pain. Recommend trial of skilled PT to address aforementioned deficits with aim of improving functional tolerance and reducing pain with typical activities. Pt departs today's session in no acute distress, all voiced concerns/questions addressed appropriately from PT perspective.    OBJECTIVE IMPAIRMENTS: Abnormal gait, decreased activity tolerance, decreased balance, decreased endurance, decreased mobility, difficulty walking, decreased ROM, decreased strength, improper body mechanics, and pain.   ACTIVITY LIMITATIONS: carrying, lifting, standing, squatting, sleeping, stairs, transfers, and locomotion level  PARTICIPATION LIMITATIONS: meal prep, cleaning, laundry, and  community activity  PERSONAL  FACTORS: Age, Time since onset of injury/illness/exacerbation, and 3+ comorbidities: afib, PVD, thyroid disease, osteopenia  are also affecting patient's functional outcome.   REHAB POTENTIAL: Good  CLINICAL DECISION MAKING: Evolving/moderate complexity  EVALUATION COMPLEXITY: Moderate   GOALS:  SHORT TERM GOALS: Target date: 04/03/2024  Pt will demonstrate appropriate understanding and performance of initially prescribed HEP in order to facilitate improved independence with management of symptoms.  Baseline: HEP established  Goal status: INITIAL   2. Pt will report at least 25% improvement in overall pain levels over past week in order to facilitate improved tolerance to typical daily activities.   Baseline: 6-7/10 on eval  Goal status: INITIAL    LONG TERM GOALS: Target date: 05/01/2024  Pt will report at least 50% decrease in overall pain levels in past week in order to facilitate improved tolerance to basic ADLs/mobility.   Baseline: 6-7/10 oneval  Goal status: INITIAL    2.  Pt will demonstrate at least 0-120 degrees of knee AROM BIL in order to facilitate improved tolerance to functional movements such as squatting/transfers.  Baseline: see ROM chart above Goal status: INITIAL  3.  Pt will demonstrate at least 4+/5 knee flex/ext MMT BIL in order to facilitate improved functional strength. Baseline: see MMT chart above Goal status: INITIAL  4.  Pt will be able to perform 5xSTS in less than or equal to 10sec in order to demonstrate reduced fall risk and improved functional independence (MCID 5xSTS = 2.3 sec). Baseline: 13.9 sec, UE support, pain, and altered mechanics Goal status: INITIAL   5. Pt will demonstrate appropriate performance of final prescribed HEP in order to facilitate improved self-management of symptoms post-discharge.   Baseline: initial HEP prescribed  Goal status: INITIAL    6. Pt will endorse ability to walk her dog with  her husband for at least 20 min with less than 3pt increase in knee pain in order to facilitate improved tolerance to community navigation.   Baseline: no longer performing  Goal status: INITIAL   PLAN:  PT FREQUENCY: 1-2x/week  PT DURATION: 8 weeks  PLANNED INTERVENTIONS: 97164- PT Re-evaluation, 97110-Therapeutic exercises, 97530- Therapeutic activity, 97112- Neuromuscular re-education, 97535- Self Care, 60630- Manual therapy, [phone]- Gait training, Patient/Family education, Balance training, Stair training, Taping, Dry Needling, Joint mobilization, Cryotherapy, and Moist heat  PLAN FOR NEXT SESSION: Review/update HEP PRN. Work on [employer] exercises as appropriate with emphasis on quad activation, knee flex mobility, functional mechanics. Symptom modification strategies as indicated/appropriate.     Ashley Murrain PT, DPT 03/20/2024 3:14 PM

## 2024-03-27 ENCOUNTER — Ambulatory Visit (HOSPITAL_BASED_OUTPATIENT_CLINIC_OR_DEPARTMENT_OTHER): Attending: Orthopedic Surgery | Admitting: Physical Therapy

## 2024-03-27 ENCOUNTER — Encounter (HOSPITAL_BASED_OUTPATIENT_CLINIC_OR_DEPARTMENT_OTHER): Payer: Self-pay | Admitting: Physical Therapy

## 2024-03-27 DIAGNOSIS — R2689 Other abnormalities of gait and mobility: Secondary | ICD-10-CM | POA: Diagnosis not present

## 2024-03-27 DIAGNOSIS — M25561 Pain in right knee: Secondary | ICD-10-CM | POA: Insufficient documentation

## 2024-03-27 DIAGNOSIS — M6281 Muscle weakness (generalized): Secondary | ICD-10-CM | POA: Insufficient documentation

## 2024-03-27 DIAGNOSIS — M25562 Pain in left knee: Secondary | ICD-10-CM | POA: Diagnosis not present

## 2024-03-27 NOTE — Therapy (Signed)
 OUTPATIENT PHYSICAL THERAPY TREATMENT   Patient Name: Paige Levy MRN: [id number] DOB:[birth year], 83 y.o., female Today's Date: 03/27/2024  END OF SESSION:  PT End of Session - 03/27/24 1432     Visit Number 4    Number of Visits 17    Date for PT Re-Evaluation 05/01/24    Authorization Type [employer] medicare    Authorization Time Period 8 visits 3/13-5/8    Authorization - Visit Number 4    Authorization - Number of Visits 8    Progress Note Due on Visit 10    PT Start Time 1432    PT Stop Time 1513    PT Time Calculation (min) 41 min    Activity Tolerance Patient tolerated treatment well               Past Medical History:  Diagnosis Date   Arthritis    Atrial fibrillation (HCC)    Hyperlipemia    Hypothyroidism    Meniere's disease    Peripheral vascular disease (HCC)    Thyroid disease    Past Surgical History:  Procedure Laterality Date   ABDOMINAL HYSTERECTOMY     CARDIOVERSION N/A 03/21/2021   Procedure: CARDIOVERSION;  Surgeon: Thurmon Fair, MD;  Location: [facility] ENDOSCOPY;  Service: Cardiovascular;  Laterality: N/A;   CARDIOVERSION N/A 07/05/2022   Procedure: CARDIOVERSION;  Surgeon: Jodelle Red, MD;  Location: [facility] ENDOSCOPY;  Service: Cardiovascular;  Laterality: N/A;   ERCP Left 10/03/2014   Procedure: ENDOSCOPIC RETROGRADE CHOLANGIOPANCREATOGRAPHY (ERCP);  Surgeon: Barrie Folk, MD;  Location: [facility] ENDOSCOPY;  Service: Gastroenterology;  Laterality: Left;   ERCP N/A 01/14/2015   Procedure: ENDOSCOPIC RETROGRADE CHOLANGIOPANCREATOGRAPHY (ERCP);  Surgeon: Petra Kuba, MD;  Location: [facility] ENDOSCOPY;  Service: Endoscopy;  Laterality: N/A;  need lithotripter/ordered/LH   SPHINCTEROTOMY  09/2014   SPYGLASS LITHOTRIPSY N/A 01/14/2015   Procedure: [id number] LITHOTRIPSY;  Surgeon: Petra Kuba, MD;  Location: [facility] ENDOSCOPY;  Service: Endoscopy;  Laterality: N/A;   Patient Active Problem List   Diagnosis Date Noted   Osteopenia 10/30/2023   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   Secondary hypercoagulable state (HCC) 03/29/2021   Current use of long term anticoagulation 03/29/2021   Essential hypertension 03/29/2021   Pure hypercholesterolemia 03/29/2021   Atrial fibrillation (HCC) 02/08/2021   Pulsatile tinnitus of right ear 01/01/2020   Submandibular lymphadenopathy 11/18/2019   Chronic rhinitis 04/27/2016   Hearing loss 04/27/2016   Active cochlear Meniere's disease, bilateral 01/17/2015   Meniere's disease 10/01/2014   Dyslipidemia 10/01/2014   Hypothyroidism 11/09/2013   Osteoarthritis 11/09/2013   Hyperlipidemia 11/09/2013    PCP: Deeann Saint, MD  REFERRING PROVIDER: Joen Laura, MD  REFERRING DIAG: Bilateral knee OA  THERAPY DIAG:  Pain in both knees, unspecified chronicity  Other abnormalities of gait and mobility  Muscle weakness (generalized)  Rationale for Evaluation and Treatment: Rehabilitation  ONSET DATE: summer 2024  SUBJECTIVE:   SUBJECTIVE STATEMENT: 03/27/2024 Pt states her knees have been continuing to improve. Feels her legs seem to be stronger, not limping as much. Has been doing well with exercises at home. No issues after last session. States she can't really recall having any pain over past week, allergies continue to bother her.    EVAL: Pt states L knee is her primary issue. BIL knees at times. No recalled MOI or change in activity. Seems to be slowly worsening over time. Symptoms are variable. Pt states she will often have pain at the end of  the day regardless of activity levels. Has received gel injection in L knee with some relief. At one point was having pain down the leg but now localized more so to knee. No n/t, denies any significant issues with swelling. Did have [place] to look at circulation issues, states it went well. Typically walks the dog with her husband, has had to reduce activity. Pt accompanied by spouse per her request.   PERTINENT HISTORY: afib, PVD,  thyroid disease, osteopenia  PAIN:  Are you having pain: no pain today  Location/description: BIL knee, aching, L > R - aggravating factors: standing  - Easing factors: injections, compression socks     PRECAUTIONS: None  RED FLAGS: None   WEIGHT BEARING RESTRICTIONS: No  FALLS:  Has patient fallen in last 6 months? 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  DIAGNOSTIC FINDINGS:  11/29/23 BIL knee XR without fracture or dislocations per chart review, please refer to EPIC for details  PATIENT SURVEYS:  LEFS score not recorded - some answers deferred  COGNITION: Overall cognitive status: Within functional limits for tasks assessed     SENSATION: Does not endorse any sensory complaints  EDEMA:  Mild pocket of swelling anterolateral knees BIL, L more so than R    PALPATION: Tenderness about L knee joint, more so anterolaterally  LOWER EXTREMITY ROM:     Active  Right eval Left eval  Hip flexion    Hip extension    Hip internal rotation    Hip external rotation    Knee extension    Knee flexion 119 deg 104 deg *  (Blank rows = not tested) (Key: WFL = within functional limits not formally assessed, * = concordant pain, s = stiffness/stretching sensation, NT = not tested)  Comments:    LOWER EXTREMITY MMT:    MMT Right eval Left eval  Hip flexion 4 4- *  Hip abduction (modified sitting) 4 4  Hip internal rotation    Hip external rotation    Knee flexion 4 3+  Knee extension 4 4-  Ankle dorsiflexion 4 4   (Blank rows = not tested) (Key: WFL = within functional limits not formally assessed, * = concordant pain, s = stiffness/stretching sensation, NT = not tested)   Comments:    FUNCTIONAL TESTS:  5xSTS: 13.90sec w/ UE support at thighs, L knee pain, tendency towards posterior LOB  GAIT: Distance walked: within clinic Assistive device utilized: None Level of assistance: Complete Independence Comments: mild antalgic gait LLE  TREATMENT DATE:  [facility] Adult PT Treatment:                                                DATE: 03/27/24 Therapeutic Exercise: LAQ x12 BIL cues for quad contraction LAQ 1# x8 BIL Heel/toe raises at counter 2x12 Standing hip abd 2x5 BIL cues for posture  Standing hip ext2x5 BIL cues for posture HEP update + education/handout  Therapeutic Activity: STS 4x5 w/ UE fwd for reduced posterior weight shift , excellent improvement w/ repetition ; significant time spent w/ education and cues 4 inch step up x5 BIL UE support    [facility] Adult PT Treatment:                                                DATE: 03/20/24 Therapeutic Exercise: LAQ 2x8 BIL cues for full ROM and foot positioning Hip 45 deg kickback standing 2x8 BIL w/ UE support Standing hamstring curl x10 BIL requires cues for pacing Heel/toe raises, standing w/ UE support x12 BIL  HEP discussion/education and rationale for interventions   Neuromuscular re-ed: Quad set 10sec hold x12 BIL tactile cues for quad activation STS 3x5 from mat, emphasis on quad activation and mitigating posterior weight shift  TKE push into bosu for quad activation 2x10 BIL     03/10/24 Quad set 10 x 10 second holds Heel slides 10 x 5-10 second holds on L SLR 1 x 10  Sidelying clam 2 x 10 LAQ 1 x 10 x 5 second holds STS 2 x 5 Standing hamstring curls 3 x 10   [facility] Adult PT Treatment:                                                DATE: 03/06/24 Therapeutic Exercise: Practice reps quads, heel slides, and STS w/ education on setup, home performance, appropriate  mechanics/safety ; HEP handout provided    PATIENT EDUCATION:  Education details: rationale for interventions, HEP  Person educated: Patient Education method: Explanation, Demonstration, Tactile cues, Verbal cues Education comprehension: verbalized understanding, returned demonstration, verbal cues required, tactile cues required, and needs further education     HOME EXERCISE PROGRAM: Access Code: [email or web address] URL: [email or web address] Date: 03/27/2024 Prepared by: Fransisco Hertz  Exercises - Seated Heel Slide  - 2-3 x daily - 1 sets - 8 reps - Sit to Stand with Armchair  - 2-3 x daily - 1 sets - 5 reps - Seated Long Arc Quad  - 2-3 x daily - 10 reps - 5 second hold - Heel Raises with Counter Support  - 2-3 x daily - 1 sets - 10 reps - Standing Hip Abduction with Counter Support  - 2-3 x daily - 1 sets - 5 reps  ASSESSMENT:  CLINICAL IMPRESSION: 03/27/2024 Pt arrives w/o pain, reports steady progress with PT thus far. Today able to progress resistance for open chain quad strengthening and volume for familiar LE exercises. She continues to demo significant posterior weight shift w/ STS, this improves notably with repetition but requires heavy cueing. Mild fatigue at end of session but no increase in  pain, no adverse events. Recommend continuing along current POC in order to address relevant deficits and improve functional tolerance. Pt departs today's session in no acute distress, all voiced questions/concerns addressed appropriately from PT perspective.     EVAL: Patient is a 83 y.o. woman who was seen today for physical therapy evaluation and treatment for BIL knee pain, L>R that is reportedly limiting tolerance to basic mobility, reducing overall activity levels. On exam pt demonstrates concordant limitations in LE strength, quad activation, and knee mobility, all of which are likely contributing to symptoms and observed alterations in gait/transfer mechanics. Pt tolerates  exam/HEP well without adverse event or increase in resting pain. Recommend trial of skilled PT to address aforementioned deficits with aim of improving functional tolerance and reducing pain with typical activities. Pt departs today's session in no acute distress, all voiced concerns/questions addressed appropriately from PT perspective.    OBJECTIVE IMPAIRMENTS: Abnormal gait, decreased activity tolerance, decreased balance, decreased endurance, decreased mobility, difficulty walking, decreased ROM, decreased strength, improper body mechanics, and pain.   ACTIVITY LIMITATIONS: carrying, lifting, standing, squatting, sleeping, stairs, transfers, and locomotion level  PARTICIPATION LIMITATIONS: meal prep, cleaning, laundry, and community activity  PERSONAL FACTORS: Age, Time since onset of injury/illness/exacerbation, and 3+ comorbidities: afib, PVD, thyroid disease, osteopenia  are also affecting patient's functional outcome.   REHAB POTENTIAL: Good  CLINICAL DECISION MAKING: Evolving/moderate complexity  EVALUATION COMPLEXITY: Moderate   GOALS:  SHORT TERM GOALS: Target date: 04/03/2024  Pt will demonstrate appropriate understanding and performance of initially prescribed HEP in order to facilitate improved independence with management of symptoms.  Baseline: HEP established  Goal status: INITIAL   2. Pt will report at least 25% improvement in overall pain levels over past week in order to facilitate improved tolerance to typical daily activities.   Baseline: 6-7/10 on eval  Goal status: INITIAL    LONG TERM GOALS: Target date: 05/01/2024  Pt will report at least 50% decrease in overall pain levels in past week in order to facilitate improved tolerance to basic ADLs/mobility.   Baseline: 6-7/10 oneval  Goal status: INITIAL    2.  Pt will demonstrate at least 0-120 degrees of knee AROM BIL in order to facilitate improved tolerance to functional movements such as squatting/transfers.   Baseline: see ROM chart above Goal status: INITIAL  3.  Pt will demonstrate at least 4+/5 knee flex/ext MMT BIL in order to facilitate improved functional strength. Baseline: see MMT chart above Goal status: INITIAL  4.  Pt will be able to perform 5xSTS in less than or equal to 10sec in order to demonstrate reduced fall risk and improved functional independence (MCID 5xSTS = 2.3 sec). Baseline: 13.9 sec, UE support, pain, and altered mechanics Goal status: INITIAL   5. Pt will demonstrate appropriate performance of final prescribed HEP in order to facilitate improved self-management of symptoms post-discharge.   Baseline: initial HEP prescribed  Goal status: INITIAL    6. Pt will endorse ability to walk her dog with her husband for at least 20 min with less than 3pt increase in knee pain in order to facilitate improved tolerance to community navigation.   Baseline: no longer performing  Goal status: INITIAL   PLAN:  PT FREQUENCY: 1-2x/week  PT DURATION: 8 weeks  PLANNED INTERVENTIONS: 97164- PT Re-evaluation, 97110-Therapeutic exercises, 97530- Therapeutic activity, 97112- Neuromuscular re-education, 97535- Self Care, [id number]- Manual therapy, [phone]- Gait training, Patient/Family education, Balance training, Stair training, Taping, Dry Needling, Joint mobilization, Cryotherapy, and Moist heat  PLAN FOR NEXT SESSION: Review/update HEP PRN. Work on [employer] exercises as appropriate with emphasis on quad activation, knee flex mobility, functional mechanics. Symptom modification strategies as indicated/appropriate.     Ashley Murrain PT, DPT 03/27/2024 3:16 PM

## 2024-04-03 ENCOUNTER — Ambulatory Visit (HOSPITAL_BASED_OUTPATIENT_CLINIC_OR_DEPARTMENT_OTHER): Admitting: Physical Therapy

## 2024-04-03 ENCOUNTER — Encounter (HOSPITAL_BASED_OUTPATIENT_CLINIC_OR_DEPARTMENT_OTHER): Payer: Self-pay | Admitting: Physical Therapy

## 2024-04-03 DIAGNOSIS — M25562 Pain in left knee: Secondary | ICD-10-CM

## 2024-04-03 DIAGNOSIS — M6281 Muscle weakness (generalized): Secondary | ICD-10-CM

## 2024-04-03 DIAGNOSIS — M25561 Pain in right knee: Secondary | ICD-10-CM | POA: Diagnosis not present

## 2024-04-03 DIAGNOSIS — R2689 Other abnormalities of gait and mobility: Secondary | ICD-10-CM | POA: Diagnosis not present

## 2024-04-03 NOTE — Therapy (Signed)
 OUTPATIENT PHYSICAL THERAPY TREATMENT   Patient Name: Paige Levy MRN: [id number] DOB:1941-11-23, 83 y.o., female Today's Date: 04/03/2024  END OF SESSION:  PT End of Session - 04/03/24 1103     Visit Number 5    Number of Visits 17    Date for PT Re-Evaluation 05/01/24    Authorization Type [employer] medicare    Authorization Time Period 8 visits 3/13-5/8    Authorization - Visit Number 5    Authorization - Number of Visits 8    Progress Note Due on Visit 10    PT Start Time 1104    PT Stop Time 1143    PT Time Calculation (min) 39 min    Activity Tolerance Patient tolerated treatment well               Past Medical History:  Diagnosis Date   Arthritis    Atrial fibrillation (HCC)    Hyperlipemia    Hypothyroidism    Meniere's disease    Peripheral vascular disease (HCC)    Thyroid disease    Past Surgical History:  Procedure Laterality Date   ABDOMINAL HYSTERECTOMY     CARDIOVERSION N/A 03/21/2021   Procedure: CARDIOVERSION;  Surgeon: Thurmon Fair, MD;  Location: [facility] ENDOSCOPY;  Service: Cardiovascular;  Laterality: N/A;   CARDIOVERSION N/A 07/05/2022   Procedure: CARDIOVERSION;  Surgeon: Jodelle Red, MD;  Location: [facility] ENDOSCOPY;  Service: Cardiovascular;  Laterality: N/A;   ERCP Left 10/03/2014   Procedure: ENDOSCOPIC RETROGRADE CHOLANGIOPANCREATOGRAPHY (ERCP);  Surgeon: Barrie Folk, MD;  Location: [facility] ENDOSCOPY;  Service: Gastroenterology;  Laterality: Left;   ERCP N/A 01/14/2015   Procedure: ENDOSCOPIC RETROGRADE CHOLANGIOPANCREATOGRAPHY (ERCP);  Surgeon: Petra Kuba, MD;  Location: [facility] ENDOSCOPY;  Service: Endoscopy;  Laterality: N/A;  need lithotripter/ordered/LH   SPHINCTEROTOMY  09/2014   SPYGLASS LITHOTRIPSY N/A 01/14/2015   Procedure: WGNFAOZH LITHOTRIPSY;  Surgeon: Petra Kuba, MD;  Location: [facility] ENDOSCOPY;  Service: Endoscopy;  Laterality: N/A;   Patient Active Problem List   Diagnosis Date Noted   Osteopenia 10/30/2023   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   Secondary hypercoagulable state (HCC) 03/29/2021   Current use of long term anticoagulation 03/29/2021   Essential hypertension 03/29/2021   Pure hypercholesterolemia 03/29/2021   Atrial fibrillation (HCC) 02/08/2021   Pulsatile tinnitus of right ear 01/01/2020   Submandibular lymphadenopathy 11/18/2019   Chronic rhinitis 04/27/2016   Hearing loss 04/27/2016   Active cochlear Meniere's disease, bilateral 01/17/2015   Meniere's disease 10/01/2014   Dyslipidemia 10/01/2014   Hypothyroidism 11/09/2013   Osteoarthritis 11/09/2013   Hyperlipidemia 11/09/2013    PCP: Deeann Saint, MD  REFERRING PROVIDER: Joen Laura, MD  REFERRING DIAG: Bilateral knee OA  THERAPY DIAG:  Pain in both knees, unspecified chronicity  Other abnormalities of gait and mobility  Muscle weakness (generalized)  Rationale for Evaluation and Treatment: Rehabilitation  ONSET DATE: summer 2024  SUBJECTIVE:   SUBJECTIVE STATEMENT: Patient states knees ache but not so bad at night. Feels that she is walking better. Started walking back in the park.    EVAL: Pt states L knee is her primary issue. BIL knees at times. No recalled MOI or change in activity. Seems to be slowly worsening over time. Symptoms are variable. Pt states she will often have pain at the end of the day regardless of activity levels. Has received gel injection in L knee with some relief. At one point was having pain down the leg but now localized  more so to knee. No n/t, denies any significant issues with swelling. Did have [place] to look at circulation issues, states it went well. Typically walks the dog with her husband, has had to reduce activity. Pt accompanied by spouse per her request.   PERTINENT HISTORY: afib, PVD, thyroid disease, osteopenia  PAIN:  Are you having pain: no pain today  Location/description: BIL knee, aching, L > R - aggravating factors: standing  - Easing factors:  injections, compression socks     PRECAUTIONS: None  RED FLAGS: None   WEIGHT BEARING RESTRICTIONS: No  FALLS:  Has patient fallen in last 6 months? 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  DIAGNOSTIC FINDINGS:  11/29/23 BIL knee XR without fracture or dislocations per chart review, please refer to EPIC for details  PATIENT SURVEYS:  LEFS score not recorded - some answers deferred  COGNITION: Overall cognitive status: Within functional limits for tasks assessed     SENSATION: Does not endorse any sensory complaints  EDEMA:  Mild pocket of swelling anterolateral knees BIL, L more so than R    PALPATION: Tenderness about L knee joint, more so anterolaterally  LOWER EXTREMITY ROM:     Active  Right eval Left eval  Hip flexion    Hip extension    Hip internal rotation    Hip external rotation    Knee extension    Knee flexion 119 deg 104 deg *  (Blank rows = not tested) (Key: WFL = within functional limits not formally assessed, * = concordant pain, s = stiffness/stretching sensation, NT = not tested)  Comments:    LOWER EXTREMITY MMT:    MMT Right eval Left eval  Hip flexion 4 4- *  Hip abduction (modified sitting) 4 4  Hip internal rotation    Hip external rotation    Knee flexion 4 3+  Knee extension 4 4-  Ankle dorsiflexion 4 4   (Blank rows = not tested) (Key: WFL = within functional limits not formally assessed, * = concordant pain, s = stiffness/stretching sensation, NT = not tested)  Comments:    FUNCTIONAL TESTS:  5xSTS: 13.90sec w/ UE support at thighs, L knee pain, tendency towards posterior LOB  GAIT: Distance walked: within clinic Assistive  device utilized: None Level of assistance: Complete Independence Comments: mild antalgic gait LLE                                                                                                                                TREATMENT DATE:  04/03/24 LAQ 1# 2 x 10 HR 1 x 15 TR 1 x 15 Standing hip abduction 2 x  10 Standing hip extension 2 x 10 STS 3 x 5 Step up 4 inch 1 x 10 Standing hamstring curls 2 x 10 Lateral stepping 3 x 15 feet bilateral    [facility] Adult PT Treatment:                                                DATE: 03/27/24 Therapeutic Exercise: LAQ x12 BIL cues for quad contraction LAQ 1# x8 BIL Heel/toe raises at counter 2x12 Standing hip abd 2x5 BIL cues for posture  Standing hip ext2x5 BIL cues for posture HEP update + education/handout  Therapeutic Activity: STS 4x5 w/ UE fwd for reduced posterior weight shift , excellent improvement w/ repetition ; significant time spent w/ education and cues 4 inch step up x5 BIL UE support    [facility] Adult PT Treatment:                                                DATE: 03/20/24 Therapeutic Exercise: LAQ 2x8 BIL cues for full ROM and foot positioning Hip 45 deg kickback standing 2x8 BIL w/ UE support Standing hamstring curl x10 BIL requires cues for pacing Heel/toe raises, standing w/ UE support x12 BIL  HEP discussion/education and rationale for interventions   Neuromuscular re-ed: Quad set 10sec hold x12 BIL tactile cues for quad activation STS 3x5 from mat, emphasis on quad activation and mitigating posterior weight shift  TKE push into bosu for quad activation 2x10 BIL     03/10/24 Quad set 10 x 10 second holds Heel slides 10 x 5-10 second holds on L SLR 1 x 10  Sidelying clam 2 x 10 LAQ 1 x 10 x 5 second holds STS 2 x 5 Standing hamstring curls 3 x 10   [facility] Adult PT Treatment:                                                DATE: 03/06/24 Therapeutic Exercise: Practice reps quads, heel slides, and STS w/  education on setup, home performance, appropriate mechanics/safety ; HEP handout provided    PATIENT EDUCATION:  Education details: rationale for interventions, HEP  Person educated: Patient Education method: Explanation, Demonstration, Tactile cues, Verbal cues Education comprehension: verbalized understanding, returned demonstration, verbal cues required, tactile cues required, and needs further education     HOME EXERCISE PROGRAM: Access Code: [email or web address] URL: [email or web address] Date: 03/27/2024 Prepared by: Fransisco Hertz  Exercises - Seated Heel Slide  - 2-3 x daily - 1 sets - 8 reps - Sit to Stand with Armchair  - 2-3 x daily - 1 sets - 5 reps - Seated Long Arc Quad  - 2-3 x daily - 10 reps - 5 second hold - Heel Raises with Counter Support  - 2-3 x daily - 1 sets - 10 reps - Standing Hip Abduction with Counter Support  - 2-3 x daily - 1 sets - 5 reps  ASSESSMENT:  CLINICAL IMPRESSION: Patient tolerating additional LE strengthening exercises well. Some knee pain on unilateral stance but able to tolerate. Patient stating minimal to to moderate fatigue at EOS.  Patient will continue to benefit from physical therapy in order to improve function and reduce impairment.    EVAL: Patient is a 83 y.o. woman who was seen today for physical therapy evaluation and treatment for BIL knee pain, L>R that is reportedly limiting tolerance to basic mobility, reducing overall activity levels. On exam pt demonstrates concordant limitations in LE strength, quad activation, and knee mobility, all of which are likely contributing to symptoms and observed alterations in gait/transfer mechanics. Pt tolerates exam/HEP well without adverse event or increase in resting pain. Recommend trial of skilled PT to address aforementioned deficits with aim of improving functional tolerance and reducing pain with typical activities. Pt departs today's session in no acute distress, all voiced  concerns/questions addressed appropriately from PT perspective.    OBJECTIVE IMPAIRMENTS: Abnormal gait, decreased activity tolerance, decreased balance, decreased endurance, decreased mobility, difficulty walking, decreased ROM, decreased strength, improper body mechanics, and pain.   ACTIVITY LIMITATIONS: carrying, lifting, standing, squatting, sleeping, stairs, transfers, and locomotion level  PARTICIPATION LIMITATIONS: meal prep, cleaning, laundry, and community activity  PERSONAL FACTORS: Age, Time since onset of injury/illness/exacerbation, and 3+ comorbidities: afib, PVD, thyroid disease, osteopenia  are also affecting patient's functional outcome.   REHAB POTENTIAL: Good  CLINICAL DECISION MAKING: Evolving/moderate complexity  EVALUATION COMPLEXITY: Moderate   GOALS:  SHORT TERM GOALS: Target date: 04/03/2024  Pt will demonstrate appropriate understanding and performance of initially prescribed HEP in order to facilitate improved independence with management of symptoms.  Baseline: HEP established  Goal status: INITIAL   2. Pt will report at least 25% improvement in overall pain levels over past week in order to facilitate improved tolerance to typical daily activities.   Baseline: 6-7/10 on eval  Goal status: INITIAL    LONG TERM GOALS: Target date: 05/01/2024  Pt will report at least 50% decrease in overall pain levels in past week in order to facilitate improved tolerance to basic ADLs/mobility.   Baseline: 6-7/10 oneval  Goal status: INITIAL    2.  Pt will demonstrate at least 0-120 degrees of knee AROM BIL in order to facilitate improved tolerance to functional movements such as squatting/transfers.  Baseline: see ROM chart above Goal status: INITIAL  3.  Pt will demonstrate at least 4+/5 knee flex/ext MMT BIL in order to facilitate improved functional strength. Baseline: see MMT chart above Goal status: INITIAL  4.  Pt will be able to perform 5xSTS in less than  or equal to 10sec in order to demonstrate reduced fall risk and improved functional independence (MCID 5xSTS = 2.3 sec). Baseline: 13.9 sec, UE support, pain, and altered mechanics Goal status: INITIAL   5. Pt will demonstrate appropriate performance of final prescribed HEP in order to facilitate improved self-management of symptoms post-discharge.   Baseline: initial HEP prescribed  Goal status: INITIAL    6. Pt will endorse ability to walk her dog with her husband for at least 20 min with less than 3pt increase in knee pain in order to facilitate improved tolerance to community navigation.   Baseline: no longer performing  Goal status: INITIAL   PLAN:  PT FREQUENCY: 1-2x/week  PT DURATION: 8 weeks  PLANNED INTERVENTIONS: 97164- PT Re-evaluation, 97110-Therapeutic exercises, 97530- Therapeutic activity, 97112- Neuromuscular re-education, 97535- Self Care, [id number]- Manual therapy, [phone]- Gait training, Patient/Family education, Balance training, Stair training, Taping, Dry Needling, Joint mobilization, Cryotherapy, and Moist heat  PLAN FOR NEXT SESSION: Review/update HEP PRN. Work on [employer] exercises as appropriate with emphasis on quad activation, knee flex  mobility, functional mechanics. Symptom modification strategies as indicated/appropriate.      Reola Mosher Tyrah Broers, PT 04/03/2024, 11:06 AM

## 2024-04-11 ENCOUNTER — Ambulatory Visit (HOSPITAL_BASED_OUTPATIENT_CLINIC_OR_DEPARTMENT_OTHER): Admitting: Physical Therapy

## 2024-04-17 ENCOUNTER — Encounter (HOSPITAL_BASED_OUTPATIENT_CLINIC_OR_DEPARTMENT_OTHER): Payer: Self-pay | Admitting: Physical Therapy

## 2024-04-17 ENCOUNTER — Ambulatory Visit (HOSPITAL_BASED_OUTPATIENT_CLINIC_OR_DEPARTMENT_OTHER): Admitting: Physical Therapy

## 2024-04-17 DIAGNOSIS — M6281 Muscle weakness (generalized): Secondary | ICD-10-CM | POA: Diagnosis not present

## 2024-04-17 DIAGNOSIS — M25561 Pain in right knee: Secondary | ICD-10-CM | POA: Diagnosis not present

## 2024-04-17 DIAGNOSIS — M25562 Pain in left knee: Secondary | ICD-10-CM

## 2024-04-17 DIAGNOSIS — R2689 Other abnormalities of gait and mobility: Secondary | ICD-10-CM | POA: Diagnosis not present

## 2024-04-17 NOTE — Therapy (Signed)
 OUTPATIENT PHYSICAL THERAPY TREATMENT   Patient Name: Paige Levy MRN: [id number] DOB:01-07-1941, 83 y.o., female Today's Date: 04/17/2024  END OF SESSION:  PT End of Session - 04/17/24 1058     Visit Number 6    Number of Visits 17    Date for PT Re-Evaluation 05/01/24    Authorization Type [employer] medicare    Authorization Time Period 8 visits 3/13-5/8    Authorization - Visit Number 6    Authorization - Number of Visits 8    Progress Note Due on Visit 10    PT Start Time 1100    PT Stop Time 1140    PT Time Calculation (min) 40 min    Activity Tolerance Patient tolerated treatment well    Behavior During Therapy WFL for tasks assessed/performed               Past Medical History:  Diagnosis Date   Arthritis    Atrial fibrillation (HCC)    Hyperlipemia    Hypothyroidism    Meniere's disease    Peripheral vascular disease (HCC)    Thyroid  disease    Past Surgical History:  Procedure Laterality Date   ABDOMINAL HYSTERECTOMY     CARDIOVERSION N/A 03/21/2021   Procedure: CARDIOVERSION;  Surgeon: Luana Rumple, MD;  Location: [facility] ENDOSCOPY;  Service: Cardiovascular;  Laterality: N/A;   CARDIOVERSION N/A 07/05/2022   Procedure: CARDIOVERSION;  Surgeon: Sheryle Donning, MD;  Location: [facility] ENDOSCOPY;  Service: Cardiovascular;  Laterality: N/A;   ERCP Left 10/03/2014   Procedure: ENDOSCOPIC RETROGRADE CHOLANGIOPANCREATOGRAPHY (ERCP);  Surgeon: Barbie Boon, MD;  Location: [facility] ENDOSCOPY;  Service: Gastroenterology;  Laterality: Left;   ERCP N/A 01/14/2015   Procedure: ENDOSCOPIC RETROGRADE CHOLANGIOPANCREATOGRAPHY (ERCP);  Surgeon: Mathew Solomon, MD;  Location: [facility] ENDOSCOPY;  Service: Endoscopy;  Laterality: N/A;  need lithotripter/ordered/LH   SPHINCTEROTOMY  09/2014   SPYGLASS LITHOTRIPSY N/A 01/14/2015   Procedure: [id number] LITHOTRIPSY;  Surgeon: Mathew Solomon, MD;  Location: [facility] ENDOSCOPY;  Service: Endoscopy;  Laterality: N/A;   Patient Active Problem List    Diagnosis Date Noted   Osteopenia 10/30/2023   Neoplasm of uncertain behavior of skin 08/28/2022   Secondary hypercoagulable state (HCC) 03/29/2021   Current use of long term anticoagulation 03/29/2021   Essential hypertension 03/29/2021   Pure hypercholesterolemia 03/29/2021   Atrial fibrillation (HCC) 02/08/2021   Pulsatile tinnitus of right ear 01/01/2020   Submandibular lymphadenopathy 11/18/2019   Chronic rhinitis 04/27/2016   Hearing loss 04/27/2016   Active cochlear Meniere's disease, bilateral 01/17/2015   Meniere's disease 10/01/2014   Dyslipidemia 10/01/2014   Hypothyroidism 11/09/2013   Osteoarthritis 11/09/2013   Hyperlipidemia 11/09/2013    PCP: Viola Greulich, MD  REFERRING PROVIDER: Murleen Arms, MD  REFERRING DIAG: Bilateral knee OA  THERAPY DIAG:  Pain in both knees, unspecified chronicity  Other abnormalities of gait and mobility  Muscle weakness (generalized)  Rationale for Evaluation and Treatment: Rehabilitation  ONSET DATE: summer 2024  SUBJECTIVE:   SUBJECTIVE STATEMENT: Patient states knees have been aching the last few days. Hasn't been doing as much the last few days. Feels that coming to PT is helpful. Can't remember being sore after last session.   EVAL: Pt states L knee is her primary issue. BIL knees at times. No recalled MOI or change in activity. Seems to be slowly worsening over time. Symptoms are variable. Pt states she will often have pain at the end of the day regardless of activity levels. Has received  gel injection in L knee with some relief. At one point was having pain down the leg but now localized more so to knee. No n/t, denies any significant issues with swelling. Did have US  to look at circulation issues, states it went well. Typically walks the dog with her husband, has had to reduce activity. Pt accompanied by spouse per her request.   PERTINENT HISTORY: afib, PVD, thyroid  disease, osteopenia  PAIN:  Are you  having pain: no pain today  Location/description: BIL knee, aching, L > R - aggravating factors: standing  - Easing factors: injections, compression socks     PRECAUTIONS: None  RED FLAGS: None   WEIGHT BEARING RESTRICTIONS: No  FALLS:  Has patient fallen in last 6 months? 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  DIAGNOSTIC FINDINGS:  11/29/23 BIL knee XR without fracture or dislocations per chart review, please refer to EPIC for details  PATIENT SURVEYS:  LEFS score not recorded - some answers deferred  COGNITION: Overall cognitive status: Within functional limits for tasks assessed     SENSATION: Does not endorse any sensory complaints  EDEMA:  Mild pocket of swelling anterolateral knees BIL, L more so than R    PALPATION: Tenderness about L knee joint, more so anterolaterally  LOWER EXTREMITY ROM:     Active  Right eval Left eval  Hip flexion    Hip extension    Hip internal rotation    Hip external rotation    Knee extension    Knee flexion 119 deg 104 deg *  (Blank rows = not tested) (Key: WFL = within functional limits not formally assessed, * = concordant pain, s = stiffness/stretching sensation, NT = not tested)  Comments:    LOWER EXTREMITY MMT:    MMT Right eval Left eval  Hip flexion 4 4- *  Hip abduction (modified sitting) 4 4  Hip internal rotation    Hip external rotation    Knee flexion 4 3+  Knee extension 4 4-  Ankle dorsiflexion 4 4   (Blank rows = not tested) (Key: WFL = within functional limits not formally assessed, * = concordant pain, s = stiffness/stretching sensation, NT = not tested)  Comments:    FUNCTIONAL TESTS:  5xSTS:  13.90sec w/ UE support at thighs, L knee pain, tendency towards posterior LOB  GAIT: Distance walked: within clinic Assistive device utilized: None Level of assistance: Complete Independence Comments: mild antalgic gait LLE                                                                                                                                TREATMENT DATE:  04/17/24 LAQ 1.5# 3 x 10 Standing hip abduction 1.5# 3 x 10 Standing hip extension 1.5# 3 x 10 Step up 4 inch 1 x 10 Lateral step up 4 inch 1 x 10  Standing hamstring curls 2# 2 x 10 NBOS on airex 2 x 30 second holds Alternating march on airex unilateral HHA 1 x 15 STS 3 x 5 Nustep 5 minutes at end of session for conditioning   04/03/24 LAQ 1# 2 x 10 HR 1 x 15 TR 1 x 15 Standing hip abduction 2 x 10 Standing hip extension 2 x 10 STS 3 x 5 Step up 4 inch 1 x 10 Standing hamstring curls 2 x 10 Lateral stepping 3 x 15 feet bilateral    [facility] Adult PT Treatment:                                                DATE: 03/27/24 Therapeutic Exercise: LAQ x12 BIL cues for quad contraction LAQ 1# x8 BIL Heel/toe raises at counter 2x12 Standing hip abd 2x5 BIL cues for posture  Standing hip ext2x5 BIL cues for posture HEP update + education/handout  Therapeutic Activity: STS 4x5 w/ UE fwd for reduced posterior weight shift , excellent improvement w/ repetition ; significant time spent w/ education and cues 4 inch step up x5 BIL UE support    [facility] Adult PT Treatment:                                                DATE: 03/20/24 Therapeutic Exercise: LAQ 2x8 BIL cues for full ROM and foot positioning Hip 45 deg kickback standing 2x8 BIL w/ UE support Standing hamstring curl x10 BIL requires cues for pacing Heel/toe raises, standing w/ UE support x12 BIL  HEP discussion/education and rationale for interventions   Neuromuscular re-ed: Quad set 10sec hold x12 BIL tactile cues for quad activation STS 3x5 from mat,  emphasis on quad activation and mitigating posterior weight shift  TKE push into bosu for quad activation 2x10 BIL     03/10/24 Quad set 10 x 10 second holds Heel slides 10 x 5-10 second holds on L SLR 1 x 10  Sidelying clam 2 x 10 LAQ 1 x 10 x 5 second holds STS 2 x 5 Standing hamstring curls 3 x 10   [facility] Adult PT Treatment:                                                DATE: 03/06/24 Therapeutic Exercise: Practice reps quads, heel slides, and STS w/ education on setup, home performance, appropriate mechanics/safety ; HEP handout provided    PATIENT EDUCATION:  Education details: rationale for interventions, HEP  Person educated: Patient Education method: Explanation, Demonstration, Tactile cues, Verbal cues Education comprehension: verbalized understanding, returned demonstration, verbal cues required, tactile cues required, and needs further education     HOME EXERCISE PROGRAM: Access Code: [email or web address] URL: [email or web address] Date: 03/27/2024 Prepared by: Mayme Spearman  Exercises - Seated Heel Slide  - 2-3 x daily - 1 sets - 8 reps - Sit to Stand with Armchair  -  2-3 x daily - 1 sets - 5 reps - Seated Long Arc Quad  - 2-3 x daily - 10 reps - 5 second hold - Heel Raises with Counter Support  - 2-3 x daily - 1 sets - 10 reps - Standing Hip Abduction with Counter Support  - 2-3 x daily - 1 sets - 5 reps  ASSESSMENT:  CLINICAL IMPRESSION: Continued with bilateral LE strengthening. Patient requires intermittent cueing for sequencing and mechanics. CGA for balance/safety on airex as patient with intermittent unsteadiness during session. Overall tolerates session well. Patient will continue to benefit from physical therapy in order to improve function and reduce impairment.    EVAL: Patient is a 83 y.o. woman who was seen today for physical therapy evaluation and treatment for BIL knee pain, L>R that is reportedly limiting tolerance to basic mobility, reducing  overall activity levels. On exam pt demonstrates concordant limitations in LE strength, quad activation, and knee mobility, all of which are likely contributing to symptoms and observed alterations in gait/transfer mechanics. Pt tolerates exam/HEP well without adverse event or increase in resting pain. Recommend trial of skilled PT to address aforementioned deficits with aim of improving functional tolerance and reducing pain with typical activities. Pt departs today's session in no acute distress, all voiced concerns/questions addressed appropriately from PT perspective.    OBJECTIVE IMPAIRMENTS: Abnormal gait, decreased activity tolerance, decreased balance, decreased endurance, decreased mobility, difficulty walking, decreased ROM, decreased strength, improper body mechanics, and pain.   ACTIVITY LIMITATIONS: carrying, lifting, standing, squatting, sleeping, stairs, transfers, and locomotion level  PARTICIPATION LIMITATIONS: meal prep, cleaning, laundry, and community activity  PERSONAL FACTORS: Age, Time since onset of injury/illness/exacerbation, and 3+ comorbidities: afib, PVD, thyroid  disease, osteopenia  are also affecting patient's functional outcome.   REHAB POTENTIAL: Good  CLINICAL DECISION MAKING: Evolving/moderate complexity  EVALUATION COMPLEXITY: Moderate   GOALS:  SHORT TERM GOALS: Target date: 04/03/2024  Pt will demonstrate appropriate understanding and performance of initially prescribed HEP in order to facilitate improved independence with management of symptoms.  Baseline: HEP established  Goal status: INITIAL   2. Pt will report at least 25% improvement in overall pain levels over past week in order to facilitate improved tolerance to typical daily activities.   Baseline: 6-7/10 on eval  Goal status: INITIAL    LONG TERM GOALS: Target date: 05/01/2024  Pt will report at least 50% decrease in overall pain levels in past week in order to facilitate improved tolerance  to basic ADLs/mobility.   Baseline: 6-7/10 oneval  Goal status: INITIAL    2.  Pt will demonstrate at least 0-120 degrees of knee AROM BIL in order to facilitate improved tolerance to functional movements such as squatting/transfers.  Baseline: see ROM chart above Goal status: INITIAL  3.  Pt will demonstrate at least 4+/5 knee flex/ext MMT BIL in order to facilitate improved functional strength. Baseline: see MMT chart above Goal status: INITIAL  4.  Pt will be able to perform 5xSTS in less than or equal to 10sec in order to demonstrate reduced fall risk and improved functional independence (MCID 5xSTS = 2.3 sec). Baseline: 13.9 sec, UE support, pain, and altered mechanics Goal status: INITIAL   5. Pt will demonstrate appropriate performance of final prescribed HEP in order to facilitate improved self-management of symptoms post-discharge.   Baseline: initial HEP prescribed  Goal status: INITIAL    6. Pt will endorse ability to walk her dog with her husband for at least 20 min with less than  3pt increase in knee pain in order to facilitate improved tolerance to community navigation.   Baseline: no longer performing  Goal status: INITIAL   PLAN:  PT FREQUENCY: 1-2x/week  PT DURATION: 8 weeks  PLANNED INTERVENTIONS: 97164- PT Re-evaluation, 97110-Therapeutic exercises, 97530- Therapeutic activity, 97112- Neuromuscular re-education, 97535- Self Care, 16109- Manual therapy, [phone]- Gait training, Patient/Family education, Balance training, Stair training, Taping, Dry Needling, Joint mobilization, Cryotherapy, and Moist heat  PLAN FOR NEXT SESSION: Review/update HEP PRN. Work on [employer] exercises as appropriate with emphasis on quad activation, knee flex mobility, functional mechanics. Symptom modification strategies as indicated/appropriate.      Perfecto Bracket Teriann Livingood, PT 04/17/2024, 10:59 AM

## 2024-04-22 ENCOUNTER — Encounter (HOSPITAL_BASED_OUTPATIENT_CLINIC_OR_DEPARTMENT_OTHER): Payer: Self-pay | Admitting: Physical Therapy

## 2024-04-22 ENCOUNTER — Ambulatory Visit (HOSPITAL_BASED_OUTPATIENT_CLINIC_OR_DEPARTMENT_OTHER): Admitting: Physical Therapy

## 2024-04-22 DIAGNOSIS — M6281 Muscle weakness (generalized): Secondary | ICD-10-CM

## 2024-04-22 DIAGNOSIS — R2689 Other abnormalities of gait and mobility: Secondary | ICD-10-CM

## 2024-04-22 DIAGNOSIS — M25562 Pain in left knee: Secondary | ICD-10-CM | POA: Diagnosis not present

## 2024-04-22 DIAGNOSIS — M25561 Pain in right knee: Secondary | ICD-10-CM | POA: Diagnosis not present

## 2024-04-22 NOTE — Therapy (Signed)
 OUTPATIENT PHYSICAL THERAPY TREATMENT   Patient Name: Paige Levy MRN: [id number] DOB:[birth year], 83 y.o., female Today's Date: 04/22/2024  END OF SESSION:  PT End of Session - 04/22/24 1342     Visit Number 7    Number of Visits 17    Date for PT Re-Evaluation 05/01/24    Authorization Type [employer] medicare    Authorization Time Period 8 visits 3/13-5/8    Authorization - Visit Number 7    Authorization - Number of Visits 8    Progress Note Due on Visit 10    PT Start Time 1345    PT Stop Time 1425    PT Time Calculation (min) 40 min    Activity Tolerance Patient tolerated treatment well    Behavior During Therapy WFL for tasks assessed/performed               Past Medical History:  Diagnosis Date   Arthritis    Atrial fibrillation (HCC)    Hyperlipemia    Hypothyroidism    Meniere's disease    Peripheral vascular disease (HCC)    Thyroid  disease    Past Surgical History:  Procedure Laterality Date   ABDOMINAL HYSTERECTOMY     CARDIOVERSION N/A 03/21/2021   Procedure: CARDIOVERSION;  Surgeon: Luana Rumple, MD;  Location: [facility] ENDOSCOPY;  Service: Cardiovascular;  Laterality: N/A;   CARDIOVERSION N/A 07/05/2022   Procedure: CARDIOVERSION;  Surgeon: Sheryle Donning, MD;  Location: [facility] ENDOSCOPY;  Service: Cardiovascular;  Laterality: N/A;   ERCP Left 10/03/2014   Procedure: ENDOSCOPIC RETROGRADE CHOLANGIOPANCREATOGRAPHY (ERCP);  Surgeon: Barbie Boon, MD;  Location: [facility] ENDOSCOPY;  Service: Gastroenterology;  Laterality: Left;   ERCP N/A 01/14/2015   Procedure: ENDOSCOPIC RETROGRADE CHOLANGIOPANCREATOGRAPHY (ERCP);  Surgeon: Mathew Solomon, MD;  Location: [facility] ENDOSCOPY;  Service: Endoscopy;  Laterality: N/A;  need lithotripter/ordered/LH   SPHINCTEROTOMY  09/2014   SPYGLASS LITHOTRIPSY N/A 01/14/2015   Procedure: [id number] LITHOTRIPSY;  Surgeon: Mathew Solomon, MD;  Location: [facility] ENDOSCOPY;  Service: Endoscopy;  Laterality: N/A;   Patient Active Problem List    Diagnosis Date Noted   Osteopenia 10/30/2023   Neoplasm of uncertain behavior of skin 08/28/2022   Secondary hypercoagulable state (HCC) 03/29/2021   Current use of long term anticoagulation 03/29/2021   Essential hypertension 03/29/2021   Pure hypercholesterolemia 03/29/2021   Atrial fibrillation (HCC) 02/08/2021   Pulsatile tinnitus of right ear 01/01/2020   Submandibular lymphadenopathy 11/18/2019   Chronic rhinitis 04/27/2016   Hearing loss 04/27/2016   Active cochlear Meniere's disease, bilateral 01/17/2015   Meniere's disease 10/01/2014   Dyslipidemia 10/01/2014   Hypothyroidism 11/09/2013   Osteoarthritis 11/09/2013   Hyperlipidemia 11/09/2013    PCP: Viola Greulich, MD  REFERRING PROVIDER: Murleen Arms, MD  REFERRING DIAG: Bilateral knee OA  THERAPY DIAG:  Pain in both knees, unspecified chronicity  Other abnormalities of gait and mobility  Muscle weakness (generalized)  Rationale for Evaluation and Treatment: Rehabilitation  ONSET DATE: summer 2024  SUBJECTIVE:   SUBJECTIVE STATEMENT: Patient states doing well. Felt alright after last session, a little sore. Has been walking a little more.    EVAL: Pt states L knee is her primary issue. BIL knees at times. No recalled MOI or change in activity. Seems to be slowly worsening over time. Symptoms are variable. Pt states she will often have pain at the end of the day regardless of activity levels. Has received gel injection in L knee with some relief. At one point was having pain  down the leg but now localized more so to knee. No n/t, denies any significant issues with swelling. Did have US  to look at circulation issues, states it went well. Typically walks the dog with her husband, has had to reduce activity. Pt accompanied by spouse per her request.   PERTINENT HISTORY: afib, PVD, thyroid  disease, osteopenia  PAIN:  Are you having pain: no pain today  Location/description: BIL knee, aching, L > R -  aggravating factors: standing  - Easing factors: injections, compression socks     PRECAUTIONS: None  RED FLAGS: None   WEIGHT BEARING RESTRICTIONS: No  FALLS:  Has patient fallen in last 6 months? 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  DIAGNOSTIC FINDINGS:  11/29/23 BIL knee XR without fracture or dislocations per chart review, please refer to EPIC for details  PATIENT SURVEYS:  LEFS score not recorded - some answers deferred  COGNITION: Overall cognitive status: Within functional limits for tasks assessed     SENSATION: Does not endorse any sensory complaints  EDEMA:  Mild pocket of swelling anterolateral knees BIL, L more so than R    PALPATION: Tenderness about L knee joint, more so anterolaterally  LOWER EXTREMITY ROM:     Active  Right eval Left eval  Hip flexion    Hip extension    Hip internal rotation    Hip external rotation    Knee extension    Knee flexion 119 deg 104 deg *  (Blank rows = not tested) (Key: WFL = within functional limits not formally assessed, * = concordant pain, s = stiffness/stretching sensation, NT = not tested)  Comments:    LOWER EXTREMITY MMT:    MMT Right eval Left eval  Hip flexion 4 4- *  Hip abduction (modified sitting) 4 4  Hip internal rotation    Hip external rotation    Knee flexion 4 3+  Knee extension 4 4-  Ankle dorsiflexion 4 4   (Blank rows = not tested) (Key: WFL = within functional limits not formally assessed, * = concordant pain, s = stiffness/stretching sensation, NT = not tested)  Comments:    FUNCTIONAL TESTS:  5xSTS: 13.90sec w/ UE support at thighs, L knee pain, tendency towards posterior  LOB  GAIT: Distance walked: within clinic Assistive device utilized: None Level of assistance: Complete Independence Comments: mild antalgic gait LLE                                                                                                                                TREATMENT DATE:  04/22/24 Nustep 5 minutes at end of session for conditioning LAQ 2# 3  x 10 Standing hip abduction RTB at knees 3 x 10  Step up 4 inch 2 x 10 Lateral step up 4 inch 2 x 10  Alternating march on airex unilateral HHA 1 x 15 Lateral stepping 6 x 12 feet   04/17/24 LAQ 1.5# 3 x 10 Standing hip abduction 1.5# 3 x 10 Standing hip extension 1.5# 3 x 10 Step up 4 inch 1 x 10 Lateral step up 4 inch 1 x 10  Standing hamstring curls 2# 2 x 10 NBOS on airex 2 x 30 second holds Alternating march on airex unilateral HHA 1 x 15 STS 3 x 5 Nustep 5 minutes at end of session for conditioning   04/03/24 LAQ 1# 2 x 10 HR 1 x 15 TR 1 x 15 Standing hip abduction 2 x 10 Standing hip extension 2 x 10 STS 3 x 5 Step up 4 inch 1 x 10 Standing hamstring curls 2 x 10 Lateral stepping 3 x 15 feet bilateral    [facility] Adult PT Treatment:                                                DATE: 03/27/24 Therapeutic Exercise: LAQ x12 BIL cues for quad contraction LAQ 1# x8 BIL Heel/toe raises at counter 2x12 Standing hip abd 2x5 BIL cues for posture  Standing hip ext2x5 BIL cues for posture HEP update + education/handout  Therapeutic Activity: STS 4x5 w/ UE fwd for reduced posterior weight shift , excellent improvement w/ repetition ; significant time spent w/ education and cues 4 inch step up x5 BIL UE support    [facility] Adult PT Treatment:                                                DATE: 03/20/24 Therapeutic Exercise: LAQ 2x8 BIL cues for full ROM and foot positioning Hip 45 deg kickback standing 2x8 BIL w/ UE support Standing hamstring curl x10 BIL requires cues for pacing Heel/toe raises, standing w/ UE  support x12 BIL  HEP discussion/education and rationale for interventions   Neuromuscular re-ed: Quad set 10sec hold x12 BIL tactile cues for quad activation STS 3x5 from mat, emphasis on quad activation and mitigating posterior weight shift  TKE push into bosu for quad activation 2x10 BIL     03/10/24 Quad set 10 x 10 second holds Heel slides 10 x 5-10 second holds on L SLR 1 x 10  Sidelying clam 2 x 10 LAQ 1 x 10 x 5 second holds STS 2 x 5 Standing hamstring curls 3 x 10   [facility] Adult PT Treatment:                                                DATE: 03/06/24 Therapeutic Exercise: Practice reps quads, heel slides, and STS w/ education on setup, home performance, appropriate mechanics/safety ; HEP handout provided    PATIENT EDUCATION:  Education details: rationale for interventions, HEP  Person educated: Patient Education method: Explanation, Demonstration, Tactile cues, Verbal cues Education comprehension: verbalized understanding, returned demonstration, verbal cues required, tactile cues required,  and needs further education     HOME EXERCISE PROGRAM: Access Code: [email or web address] URL: [email or web address] Date: 03/27/2024 Prepared by: Mayme Spearman  Exercises - Seated Heel Slide  - 2-3 x daily - 1 sets - 8 reps - Sit to Stand with Armchair  - 2-3 x daily - 1 sets - 5 reps - Seated Long Arc Quad  - 2-3 x daily - 10 reps - 5 second hold - Heel Raises with Counter Support  - 2-3 x daily - 1 sets - 10 reps - Standing Hip Abduction with Counter Support  - 2-3 x daily - 1 sets - 5 reps  ASSESSMENT:  CLINICAL IMPRESSION: Patient progressing well with increased resistance and reps of previously completed exercises. She requires intermittent cueing for mechanics and sequencing. Patient will continue to benefit from physical therapy in order to improve function and reduce impairment.    EVAL: Patient is a 83 y.o. woman who was seen today for physical therapy  evaluation and treatment for BIL knee pain, L>R that is reportedly limiting tolerance to basic mobility, reducing overall activity levels. On exam pt demonstrates concordant limitations in LE strength, quad activation, and knee mobility, all of which are likely contributing to symptoms and observed alterations in gait/transfer mechanics. Pt tolerates exam/HEP well without adverse event or increase in resting pain. Recommend trial of skilled PT to address aforementioned deficits with aim of improving functional tolerance and reducing pain with typical activities. Pt departs today's session in no acute distress, all voiced concerns/questions addressed appropriately from PT perspective.    OBJECTIVE IMPAIRMENTS: Abnormal gait, decreased activity tolerance, decreased balance, decreased endurance, decreased mobility, difficulty walking, decreased ROM, decreased strength, improper body mechanics, and pain.   ACTIVITY LIMITATIONS: carrying, lifting, standing, squatting, sleeping, stairs, transfers, and locomotion level  PARTICIPATION LIMITATIONS: meal prep, cleaning, laundry, and community activity  PERSONAL FACTORS: Age, Time since onset of injury/illness/exacerbation, and 3+ comorbidities: afib, PVD, thyroid  disease, osteopenia  are also affecting patient's functional outcome.   REHAB POTENTIAL: Good  CLINICAL DECISION MAKING: Evolving/moderate complexity  EVALUATION COMPLEXITY: Moderate   GOALS:  SHORT TERM GOALS: Target date: 04/03/2024  Pt will demonstrate appropriate understanding and performance of initially prescribed HEP in order to facilitate improved independence with management of symptoms.  Baseline: HEP established  Goal status: INITIAL   2. Pt will report at least 25% improvement in overall pain levels over past week in order to facilitate improved tolerance to typical daily activities.   Baseline: 6-7/10 on eval  Goal status: INITIAL    LONG TERM GOALS: Target date:  05/01/2024  Pt will report at least 50% decrease in overall pain levels in past week in order to facilitate improved tolerance to basic ADLs/mobility.   Baseline: 6-7/10 oneval  Goal status: INITIAL    2.  Pt will demonstrate at least 0-120 degrees of knee AROM BIL in order to facilitate improved tolerance to functional movements such as squatting/transfers.  Baseline: see ROM chart above Goal status: INITIAL  3.  Pt will demonstrate at least 4+/5 knee flex/ext MMT BIL in order to facilitate improved functional strength. Baseline: see MMT chart above Goal status: INITIAL  4.  Pt will be able to perform 5xSTS in less than or equal to 10sec in order to demonstrate reduced fall risk and improved functional independence (MCID 5xSTS = 2.3 sec). Baseline: 13.9 sec, UE support, pain, and altered mechanics Goal status: INITIAL   5. Pt will demonstrate appropriate performance of final prescribed HEP in order to facilitate  improved self-management of symptoms post-discharge.   Baseline: initial HEP prescribed  Goal status: INITIAL    6. Pt will endorse ability to walk her dog with her husband for at least 20 min with less than 3pt increase in knee pain in order to facilitate improved tolerance to community navigation.   Baseline: no longer performing  Goal status: INITIAL   PLAN:  PT FREQUENCY: 1-2x/week  PT DURATION: 8 weeks  PLANNED INTERVENTIONS: 97164- PT Re-evaluation, 97110-Therapeutic exercises, 97530- Therapeutic activity, 97112- Neuromuscular re-education, 97535- Self Care, 16109- Manual therapy, [id number]- Gait training, Patient/Family education, Balance training, Stair training, Taping, Dry Needling, Joint mobilization, Cryotherapy, and Moist heat  PLAN FOR NEXT SESSION: Review/update HEP PRN. Work on [employer] exercises as appropriate with emphasis on quad activation, knee flex mobility, functional mechanics. Symptom modification strategies as indicated/appropriate.      Perfecto Bracket Yamilka Lopiccolo, PT 04/22/2024, 2:29 PM

## 2024-04-27 ENCOUNTER — Other Ambulatory Visit: Payer: Self-pay | Admitting: Physician Assistant

## 2024-04-27 NOTE — Telephone Encounter (Unsigned)
 Copied from CRM [id number]. Topic: Clinical - Medication Refill >> Apr 27, 2024  1:10 PM Magdalene School wrote: Most Recent Primary Care Visit:  Provider: Cinda Craze R  Department: [facility]  Visit Type: OFFICE VISIT  Date: 11/15/2023  Medication: triamterene -hydrochlorothiazide  (MAXZIDE -25) 37.5-25 MG tablet   Has the patient contacted their pharmacy? Yes (Agent: If no, request that the patient contact the pharmacy for the refill. If patient does not wish to contact the pharmacy document the reason why and proceed with request.) (Agent: If yes, when and what did the pharmacy advise?) Pharmacy stated that she will need new prescription. Patient is not sure if she will need an appointment first. Is this the correct pharmacy for this prescription? Yes If no, delete pharmacy and type the correct one.  This is the patient's preferred pharmacy:  [id number] - [place], [place] - [address] [place] [address] Phone: [phone] Fax: [phone]   Has the prescription been filled recently? No  Is the patient out of the medication? No  Has the patient been seen for an appointment in the last year OR does the patient have an upcoming appointment? Yes  Can we respond through MyChart? No  Agent: Please be advised that Rx refills may take up to 3 business days. We ask that you follow-up with your pharmacy.

## 2024-04-28 ENCOUNTER — Other Ambulatory Visit: Payer: Self-pay | Admitting: Family Medicine

## 2024-04-28 NOTE — Telephone Encounter (Signed)
 Copied from CRM [id number]. Topic: Clinical - Medication Refill >> Apr 28, 2024  2:32 PM Alyse July wrote: Most Recent Primary Care Visit:  Provider: Viola Greulich  Department: [facility]  Visit Type: OFFICE VISIT  Date: 11/15/2023  Medication: triamterene -hydrochlorothiazide  (MAXZIDE -25) 37.5-25 MG tablet  Has the patient contacted their pharmacy? Yes (Agent: If no, request that the patient contact the pharmacy for the refill. If patient does not wish to contact the pharmacy document the reason why and proceed with request.) (Agent: If yes, when and what did the pharmacy advise?)  Is this the correct pharmacy for this prescription? Yes If no, delete pharmacy and type the correct one.  This is the patient's preferred pharmacy:  [id number] - [place], [place] - [address] [place] [address] Phone: [phone] Fax: [phone]   Has the prescription been filled recently? No  Is the patient out of the medication? No (will be out on Monday)  Has the patient been seen for an appointment in the last year OR does the patient have an upcoming appointment? Yes  Can we respond through MyChart? No  Agent: Please be advised that Rx refills may take up to 3 business days. We ask that you follow-up with your pharmacy.

## 2024-04-29 ENCOUNTER — Encounter (HOSPITAL_BASED_OUTPATIENT_CLINIC_OR_DEPARTMENT_OTHER): Admitting: Physical Therapy

## 2024-04-30 ENCOUNTER — Ambulatory Visit (HOSPITAL_BASED_OUTPATIENT_CLINIC_OR_DEPARTMENT_OTHER): Attending: Orthopedic Surgery | Admitting: Physical Therapy

## 2024-04-30 DIAGNOSIS — M25561 Pain in right knee: Secondary | ICD-10-CM | POA: Diagnosis not present

## 2024-04-30 DIAGNOSIS — M25562 Pain in left knee: Secondary | ICD-10-CM | POA: Diagnosis not present

## 2024-04-30 DIAGNOSIS — M6281 Muscle weakness (generalized): Secondary | ICD-10-CM | POA: Diagnosis not present

## 2024-04-30 DIAGNOSIS — R2689 Other abnormalities of gait and mobility: Secondary | ICD-10-CM | POA: Insufficient documentation

## 2024-04-30 NOTE — Therapy (Addendum)
 OUTPATIENT PHYSICAL THERAPY TREATMENT   Patient Name: Paige Levy MRN: [id number] DOB:[birth year], 83 y.o., female Today's Date: 04/30/2024  Progress Note   Reporting Period 03/06/24 to 04/30/24   See note below for Objective Data and Assessment of Progress/Goals   END OF SESSION:  PT End of Session - 04/30/24 1740     Visit Number 8    Number of Visits 17    Date for PT Re-Evaluation 05/01/24    Authorization Type [employer] medicare    Authorization Time Period 8 visits 3/13-5/8    Authorization - Visit Number 8    Authorization - Number of Visits 8    Progress Note Due on Visit 10    PT Start Time 1315    PT Stop Time 1353    PT Time Calculation (min) 38 min    Activity Tolerance Patient tolerated treatment well    Behavior During Therapy WFL for tasks assessed/performed                Past Medical History:  Diagnosis Date   Arthritis    Atrial fibrillation (HCC)    Hyperlipemia    Hypothyroidism    Meniere's disease    Peripheral vascular disease (HCC)    Thyroid  disease    Past Surgical History:  Procedure Laterality Date   ABDOMINAL HYSTERECTOMY     CARDIOVERSION N/A 03/21/2021   Procedure: CARDIOVERSION;  Surgeon: Luana Rumple, MD;  Location: [facility] ENDOSCOPY;  Service: Cardiovascular;  Laterality: N/A;   CARDIOVERSION N/A 07/05/2022   Procedure: CARDIOVERSION;  Surgeon: Sheryle Donning, MD;  Location: [facility] ENDOSCOPY;  Service: Cardiovascular;  Laterality: N/A;   ERCP Left 10/03/2014   Procedure: ENDOSCOPIC RETROGRADE CHOLANGIOPANCREATOGRAPHY (ERCP);  Surgeon: Barbie Boon, MD;  Location: [facility] ENDOSCOPY;  Service: Gastroenterology;  Laterality: Left;   ERCP N/A 01/14/2015   Procedure: ENDOSCOPIC RETROGRADE CHOLANGIOPANCREATOGRAPHY (ERCP);  Surgeon: Mathew Solomon, MD;  Location: [facility] ENDOSCOPY;  Service: Endoscopy;  Laterality: N/A;  need lithotripter/ordered/LH   SPHINCTEROTOMY  09/2014   SPYGLASS LITHOTRIPSY N/A 01/14/2015   Procedure: MWNUUVOZ LITHOTRIPSY;   Surgeon: Mathew Solomon, MD;  Location: [facility] ENDOSCOPY;  Service: Endoscopy;  Laterality: N/A;   Patient Active Problem List   Diagnosis Date Noted   Osteopenia 10/30/2023   Neoplasm of uncertain behavior of skin 08/28/2022   Secondary hypercoagulable state (HCC) 03/29/2021   Current use of long term anticoagulation 03/29/2021   Essential hypertension 03/29/2021   Pure hypercholesterolemia 03/29/2021   Atrial fibrillation (HCC) 02/08/2021   Pulsatile tinnitus of right ear 01/01/2020   Submandibular lymphadenopathy 11/18/2019   Chronic rhinitis 04/27/2016   Hearing loss 04/27/2016   Active cochlear Meniere's disease, bilateral 01/17/2015   Meniere's disease 10/01/2014   Dyslipidemia 10/01/2014   Hypothyroidism 11/09/2013   Osteoarthritis 11/09/2013   Hyperlipidemia 11/09/2013    PCP: Viola Greulich, MD  REFERRING PROVIDER: Murleen Arms, MD  REFERRING DIAG: Bilateral knee OA  THERAPY DIAG:  Pain in both knees, unspecified chronicity  Other abnormalities of gait and mobility  Muscle weakness (generalized)  Rationale for Evaluation and Treatment: Rehabilitation  ONSET DATE: summer 2024  SUBJECTIVE:   SUBJECTIVE STATEMENT: 04/30/24 Patient states she was doing well, up until yesterday.  Woke up with 9/10 pain in L knee.  She reports compliance with HEP.    EVAL: Pt states L knee is her primary issue. BIL knees at times. No recalled MOI or change in activity. Seems to be slowly worsening over time. Symptoms are variable. Pt states  she will often have pain at the end of the day regardless of activity levels. Has received gel injection in L knee with some relief. At one point was having pain down the leg but now localized more so to knee. No n/t, denies any significant issues with swelling. Did have US  to look at circulation issues, states it went well. Typically walks the dog with her husband, has had to reduce activity. Pt accompanied by spouse per her request.    PERTINENT HISTORY: afib, PVD, thyroid  disease, osteopenia  PAIN:  Are you having pain: yes;   NPRS: 4/10 (was a 9/10 upon waking)  Location/description: BIL knee, aching, L > R - aggravating factors: standing  - Easing factors: injections, compression socks     PRECAUTIONS: None  RED FLAGS: None   WEIGHT BEARING RESTRICTIONS: No  FALLS:  Has patient fallen in last 6 months? 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  DIAGNOSTIC FINDINGS:  11/29/23 BIL knee XR without fracture or dislocations per chart review, please refer to EPIC for details  PATIENT SURVEYS:  LEFS score not recorded - some answers deferred  COGNITION: Overall cognitive status: Within functional limits for tasks assessed     SENSATION: Does not endorse any sensory complaints  EDEMA:  Mild pocket of swelling anterolateral knees BIL, L more so than R    PALPATION: Tenderness about L knee joint, more so anterolaterally  LOWER EXTREMITY ROM:     Active  Right eval Left eval Left 04/30/24  Hip flexion     Hip extension     Hip internal rotation     Hip external rotation     Knee extension     Knee flexion 119 deg 104 deg * 120  (Blank rows = not tested) (Key: WFL = within functional limits not formally assessed, * = concordant pain, s = stiffness/stretching sensation, NT = not tested)  Comments:    LOWER EXTREMITY MMT:    MMT Right eval Left eval R 04/30/24 L  04/30/24  Hip flexion 4 4- *    Hip abduction (modified sitting) 4 4    Hip internal rotation      Hip external rotation      Knee flexion 4 3+ 4+/5 4/5  Knee extension 4 4-    Ankle dorsiflexion 4 4     (Blank rows = not tested) (Key:  WFL = within functional limits not formally assessed, * = concordant pain, s = stiffness/stretching sensation, NT = not tested)  Comments:    FUNCTIONAL TESTS:  5xSTS: 13.90sec w/ UE support at thighs, L knee pain, tendency towards posterior LOB  04/30/24:  5x STS: 12.88 sec with UE support on chair, 1st attempt with post LOB   GAIT: Distance walked: within clinic Assistive device utilized: None Level of assistance: Complete Independence Comments: mild antalgic gait LLE  TREATMENT DATE:  04/30/24 Nustep 5 minutes  for conditioning and warm up MMT knee flexion STS test, then STS with controlled descent x 10 LAQ 3#,  2 x 10 each LE Heel raises x 25 Forward step ups 4" x 10 each LE Lateral step ups onto airex x 10, light UE support Forward step over and back (over 1/2 foam roller); side stepping over 1/2 foam roller Marching on airex pad, challenge  R/L calf stretch (cues for form and not bouncing)   04/22/24 Nustep 5 minutes at end of session for conditioning LAQ 2# 3 x 10 Standing hip abduction RTB at knees 3 x 10  Step up 4 inch 2 x 10 Lateral step up 4 inch 2 x 10  Alternating march on airex unilateral HHA 1 x 15 Lateral stepping 6 x 12 feet   04/17/24 LAQ 1.5# 3 x 10 Standing hip abduction 1.5# 3 x 10 Standing hip extension 1.5# 3 x 10 Step up 4 inch 1 x 10 Lateral step up 4 inch 1 x 10  Standing hamstring curls 2# 2 x 10 NBOS on airex 2 x 30 second holds Alternating march on airex unilateral HHA 1 x 15 STS 3 x 5 Nustep 5 minutes at end of session for conditioning   04/03/24 LAQ 1# 2 x 10 HR 1 x 15 TR 1 x 15 Standing hip abduction 2 x 10 Standing hip extension 2 x 10 STS 3 x 5 Step up 4 inch 1 x 10 Standing hamstring curls 2 x 10 Lateral stepping 3 x 15 feet bilateral    [facility] Adult PT Treatment:                                                 DATE: 03/27/24 Therapeutic Exercise: LAQ x12 BIL cues for quad contraction LAQ 1# x8 BIL Heel/toe raises at counter 2x12 Standing hip abd 2x5 BIL cues for posture  Standing hip ext2x5 BIL cues for posture HEP update + education/handout  Therapeutic Activity: STS 4x5 w/ UE fwd for reduced posterior weight shift , excellent improvement w/ repetition ; significant time spent w/ education and cues 4 inch step up x5 BIL UE support    [facility] Adult PT Treatment:                                                DATE: 03/20/24 Therapeutic Exercise: LAQ 2x8 BIL cues for full ROM and foot positioning Hip 45 deg kickback standing 2x8 BIL w/ UE support Standing hamstring curl x10 BIL requires cues for pacing Heel/toe raises, standing w/ UE support x12 BIL  HEP discussion/education and rationale for interventions   Neuromuscular re-ed: Quad set 10sec hold x12 BIL tactile cues for quad activation STS 3x5 from mat, emphasis on quad activation and mitigating posterior weight shift  TKE push into bosu for quad activation 2x10 BIL     03/10/24 Quad set 10 x 10 second holds Heel slides 10 x 5-10 second holds on L SLR 1 x 10  Sidelying clam 2 x 10 LAQ 1 x 10 x 5 second holds STS 2 x 5 Standing hamstring curls 3 x 10   [facility] Adult PT Treatment:  DATE: 03/06/24 Therapeutic Exercise: Practice reps quads, heel slides, and STS w/ education on setup, home performance, appropriate mechanics/safety ; HEP handout provided    PATIENT EDUCATION:  Education details: rationale for interventions Person educated: Patient Education method: Explanation, Demonstration, Tactile cues, Verbal cues Education comprehension: verbalized understanding, returned demonstration, verbal cues required, tactile cues required, and needs further education     HOME EXERCISE PROGRAM: Access Code: [email or web address] URL: [email or web address] Date: 03/27/2024 Prepared by:  Mayme Spearman  Exercises - Seated Heel Slide  - 2-3 x daily - 1 sets - 8 reps - Sit to Stand with Armchair  - 2-3 x daily - 1 sets - 5 reps - Seated Long Arc Quad  - 2-3 x daily - 10 reps - 5 second hold - Heel Raises with Counter Support  - 2-3 x daily - 1 sets - 10 reps - Standing Hip Abduction with Counter Support  - 2-3 x daily - 1 sets - 5 reps  ASSESSMENT:  CLINICAL IMPRESSION: Pt's 5x STS improved, but not at goal yet.  Bilat hamstring strength has improved.  Pt reporting 50% improvement in mobility and pain level since starting therapy.  Pt completed today's session with minimal increase in pain; mostly just fatigue. She has partially met her goals.  Patient progressing well with increased resistance and reps of previously completed exercises. She requires intermittent cueing for mechanics and sequencing. Patient will continue to benefit from physical therapy in order to improve function and reduce impairment.  Patient has met 2/2 short term goals and 3/6 long term goals with ability to complete HEP and improvement in symptoms, ROM, activity tolerance, gait, balance, and functional mobility. Remaining goals not met due to continued deficits in strength, symptoms, strength, functional mobility. Patient has made good progress toward remaining goals. Extending POC 1-2x/week for 6 weeks to work toward remaining goals. Patient will continue to benefit from skilled physical therapy in order to improve function and reduce impairment.  7:49 AM, 05/05/24 Beather Liming PT, DPT Physical Therapist at [facility]     EVAL: Patient is a 83 y.o. woman who was seen today for physical therapy evaluation and treatment for BIL knee pain, L>R that is reportedly limiting tolerance to basic mobility, reducing overall activity levels. On exam pt demonstrates concordant limitations in LE strength, quad activation, and knee mobility, all of which are likely contributing to symptoms and observed  alterations in gait/transfer mechanics. Pt tolerates exam/HEP well without adverse event or increase in resting pain. Recommend trial of skilled PT to address aforementioned deficits with aim of improving functional tolerance and reducing pain with typical activities. Pt departs today's session in no acute distress, all voiced concerns/questions addressed appropriately from PT perspective.    OBJECTIVE IMPAIRMENTS: Abnormal gait, decreased activity tolerance, decreased balance, decreased endurance, decreased mobility, difficulty walking, decreased ROM, decreased strength, improper body mechanics, and pain.   ACTIVITY LIMITATIONS: carrying, lifting, standing, squatting, sleeping, stairs, transfers, and locomotion level  PARTICIPATION LIMITATIONS: meal prep, cleaning, laundry, and community activity  PERSONAL FACTORS: Age, Time since onset of injury/illness/exacerbation, and 3+ comorbidities: afib, PVD, thyroid  disease, osteopenia are also affecting patient's functional outcome.   REHAB POTENTIAL: Good  CLINICAL DECISION MAKING: Evolving/moderate complexity  EVALUATION COMPLEXITY: Moderate   GOALS:  SHORT TERM GOALS: Target date: 04/03/2024  Pt will demonstrate appropriate understanding and performance of initially prescribed HEP in order to facilitate improved independence with management of symptoms.  Baseline: HEP established  Goal status: MET  -04/30/24  2. Pt will  report at least 25% improvement in overall pain levels over past week in order to facilitate improved tolerance to typical daily activities.   Baseline:   Goal status: MET - 04/30/24  LONG TERM GOALS: Target date: 05/01/2024  Pt will report at least 50% decrease in overall pain levels in past week in order to facilitate improved tolerance to basic ADLs/mobility.   Baseline: 6-7/10 oneval  Goal status: MET 04/30/24  2.  Pt will demonstrate at least 0-120 degrees of knee AROM BIL in order to facilitate improved tolerance to  functional movements such as squatting/transfers.  Baseline: see ROM chart above Goal status: MET- 04/30/24  3.  Pt will demonstrate at least 4+/5 knee flex/ext MMT BIL in order to facilitate improved functional strength. Baseline: see MMT chart above Goal status:In progress - 04/30/24  4.  Pt will be able to perform 5xSTS in less than or equal to 10sec in order to demonstrate reduced fall risk and improved functional independence (MCID 5xSTS = 2.3 sec). Baseline: see above  Goal status: IN PROGRESS - 04/30/24   5. Pt will demonstrate appropriate performance of final prescribed HEP in order to facilitate improved self-management of symptoms post-discharge.   Baseline: initial HEP prescribed  Goal status: IN PROGRESS   6. Pt will endorse ability to walk her dog with her husband for at least 20 min with less than 3pt increase in knee pain in order to facilitate improved tolerance to community navigation.   Baseline:   Goal status: MET - can walk 30 min.    PLAN:  PT FREQUENCY: 1-2x/week  PT DURATION: 8 weeks  PLANNED INTERVENTIONS: 97164- PT Re-evaluation, 97110-Therapeutic exercises, 97530- Therapeutic activity, [id number]- Neuromuscular re-education, 97535- Self Care, [id number]- Manual therapy, [phone]- Gait training, Patient/Family education, Balance training, Stair training, Taping, Dry Needling, Joint mobilization, Cryotherapy, and Moist heat  PLAN FOR NEXT SESSION: Review/update HEP PRN. Work on [employer] exercises as appropriate with emphasis on quad activation, knee flex mobility, functional mechanics. Symptom modification strategies as indicated/appropriate.    Almedia Jacobsen, PTA 04/30/24 5:41 PM [facility] [address] [place], [place], [address] Phone: [phone]   Fax:  [phone]

## 2024-05-01 ENCOUNTER — Other Ambulatory Visit: Payer: Self-pay | Admitting: Family Medicine

## 2024-05-01 NOTE — Telephone Encounter (Unsigned)
 Copied from CRM [id number]. Topic: Clinical - Prescription Issue >> May 01, 2024 12:13 PM Paige Levy G wrote: Reason for CRM: PT CALLING ABOUT REFILL FOR triamterene -hydrochlorothiazide  (MAXZIDE -25) 37.5-25 MG  STATED SHE WILL BE OUT BY MONDAY

## 2024-05-03 ENCOUNTER — Other Ambulatory Visit: Payer: Self-pay | Admitting: Physician Assistant

## 2024-05-04 NOTE — Telephone Encounter (Signed)
 Agent will let husband know refill can take up to 3 business days

## 2024-05-05 ENCOUNTER — Other Ambulatory Visit: Payer: Self-pay | Admitting: Family Medicine

## 2024-05-05 ENCOUNTER — Telehealth: Payer: Self-pay

## 2024-05-05 DIAGNOSIS — I10 Essential (primary) hypertension: Secondary | ICD-10-CM

## 2024-05-05 MED ORDER — TRIAMTERENE-HCTZ 37.5-25 MG PO TABS
1.0000 | ORAL_TABLET | Freq: Every day | ORAL | 1 refills | Status: DC
Start: 2024-05-05 — End: 2024-05-06

## 2024-05-05 NOTE — Telephone Encounter (Signed)
 Copied from CRM [id number]. Topic: Clinical - Medication Question >> May 05, 2024  9:50 AM Paige Levy wrote: Reason for CRM: Patient has an appointment for 5/14 @830  and would like to know if she could receive triamterene -hydrochlorothiazide  (MAXZIDE -25) 37.5-25 MG tablet until then

## 2024-05-05 NOTE — Addendum Note (Signed)
 Addended by: Latasha Puskas S on: 05/05/2024 07:51 AM   Modules accepted: Orders

## 2024-05-05 NOTE — Telephone Encounter (Signed)
 Refill of Maxide sent to pharmacy.

## 2024-05-06 ENCOUNTER — Ambulatory Visit (HOSPITAL_BASED_OUTPATIENT_CLINIC_OR_DEPARTMENT_OTHER): Admitting: Physical Therapy

## 2024-05-06 ENCOUNTER — Encounter (HOSPITAL_BASED_OUTPATIENT_CLINIC_OR_DEPARTMENT_OTHER): Payer: Self-pay | Admitting: Physical Therapy

## 2024-05-06 ENCOUNTER — Ambulatory Visit (INDEPENDENT_AMBULATORY_CARE_PROVIDER_SITE_OTHER): Admitting: Family Medicine

## 2024-05-06 ENCOUNTER — Ambulatory Visit: Admitting: Family Medicine

## 2024-05-06 ENCOUNTER — Encounter: Payer: Self-pay | Admitting: Family Medicine

## 2024-05-06 VITALS — BP 126/74 | HR 136 | Temp 98.4°F | Ht 66.0 in | Wt 160.4 lb

## 2024-05-06 DIAGNOSIS — I480 Paroxysmal atrial fibrillation: Secondary | ICD-10-CM

## 2024-05-06 DIAGNOSIS — E039 Hypothyroidism, unspecified: Secondary | ICD-10-CM | POA: Diagnosis not present

## 2024-05-06 DIAGNOSIS — M25561 Pain in right knee: Secondary | ICD-10-CM | POA: Diagnosis not present

## 2024-05-06 DIAGNOSIS — M25562 Pain in left knee: Secondary | ICD-10-CM

## 2024-05-06 DIAGNOSIS — I10 Essential (primary) hypertension: Secondary | ICD-10-CM

## 2024-05-06 DIAGNOSIS — R2689 Other abnormalities of gait and mobility: Secondary | ICD-10-CM | POA: Diagnosis not present

## 2024-05-06 DIAGNOSIS — M6281 Muscle weakness (generalized): Secondary | ICD-10-CM

## 2024-05-06 LAB — BASIC METABOLIC PANEL WITH GFR
BUN: 17 mg/dL (ref 6–23)
CO2: 28 meq/L (ref 19–32)
Calcium: 9.2 mg/dL (ref 8.4–10.5)
Chloride: 100 meq/L (ref 96–112)
Creatinine, Ser: 1.35 mg/dL — ABNORMAL HIGH (ref 0.40–1.20)
GFR: 36.06 mL/min — ABNORMAL LOW (ref >60.00)
Glucose, Bld: 89 mg/dL (ref 70–99)
Potassium: 3.7 meq/L (ref 3.5–5.1)
Sodium: 137 meq/L (ref 135–145)

## 2024-05-06 LAB — TSH: TSH: 2.86 u[IU]/mL (ref 0.35–5.50)

## 2024-05-06 MED ORDER — TRIAMTERENE-HCTZ 37.5-25 MG PO TABS
1.0000 | ORAL_TABLET | Freq: Every day | ORAL | 3 refills | Status: AC
Start: 2024-05-06 — End: ?

## 2024-05-06 NOTE — Therapy (Signed)
 OUTPATIENT PHYSICAL THERAPY TREATMENT   Patient Name: Paige Levy MRN: [id number] DOB:May 29, 1941, 83 y.o., female Today's Date: 05/06/2024  END OF SESSION:  PT End of Session - 05/06/24 1102     Visit Number 9    Number of Visits 29    Date for PT Re-Evaluation 06/11/24    Authorization Type [employer] medicare    Progress Note Due on Visit 18    PT Start Time 1101    PT Stop Time 1140    PT Time Calculation (min) 39 min    Behavior During Therapy WFL for tasks assessed/performed                Past Medical History:  Diagnosis Date   Arthritis    Atrial fibrillation (HCC)    Hyperlipemia    Hypothyroidism    Meniere's disease    Peripheral vascular disease (HCC)    Thyroid  disease    Past Surgical History:  Procedure Laterality Date   ABDOMINAL HYSTERECTOMY     CARDIOVERSION N/A 03/21/2021   Procedure: CARDIOVERSION;  Surgeon: Luana Rumple, MD;  Location: [facility] ENDOSCOPY;  Service: Cardiovascular;  Laterality: N/A;   CARDIOVERSION N/A 07/05/2022   Procedure: CARDIOVERSION;  Surgeon: Sheryle Donning, MD;  Location: [facility] ENDOSCOPY;  Service: Cardiovascular;  Laterality: N/A;   ERCP Left 10/03/2014   Procedure: ENDOSCOPIC RETROGRADE CHOLANGIOPANCREATOGRAPHY (ERCP);  Surgeon: Barbie Boon, MD;  Location: [facility] ENDOSCOPY;  Service: Gastroenterology;  Laterality: Left;   ERCP N/A 01/14/2015   Procedure: ENDOSCOPIC RETROGRADE CHOLANGIOPANCREATOGRAPHY (ERCP);  Surgeon: Mathew Solomon, MD;  Location: [facility] ENDOSCOPY;  Service: Endoscopy;  Laterality: N/A;  need lithotripter/ordered/LH   SPHINCTEROTOMY  09/2014   SPYGLASS LITHOTRIPSY N/A 01/14/2015   Procedure: YQMVHQIO LITHOTRIPSY;  Surgeon: Mathew Solomon, MD;  Location: [facility] ENDOSCOPY;  Service: Endoscopy;  Laterality: N/A;   Patient Active Problem List   Diagnosis Date Noted   Osteopenia 10/30/2023   Neoplasm of uncertain behavior of skin 08/28/2022   Secondary hypercoagulable state (HCC) 03/29/2021   Current use of long term  anticoagulation 03/29/2021   Essential hypertension 03/29/2021   Pure hypercholesterolemia 03/29/2021   Atrial fibrillation (HCC) 02/08/2021   Pulsatile tinnitus of right ear 01/01/2020   Submandibular lymphadenopathy 11/18/2019   Chronic rhinitis 04/27/2016   Hearing loss 04/27/2016   Active cochlear Meniere's disease, bilateral 01/17/2015   Meniere's disease 10/01/2014   Dyslipidemia 10/01/2014   Hypothyroidism 11/09/2013   Osteoarthritis 11/09/2013   Hyperlipidemia 11/09/2013    PCP: Viola Greulich, MD  REFERRING PROVIDER: Murleen Arms, MD  REFERRING DIAG: Bilateral knee OA  THERAPY DIAG:  Pain in both knees, unspecified chronicity  Other abnormalities of gait and mobility  Muscle weakness (generalized)  Rationale for Evaluation and Treatment: Rehabilitation  ONSET DATE: summer 2024  SUBJECTIVE:   SUBJECTIVE STATEMENT: 05/06/24 Patient states she is not doing well today, due to weather.      EVAL: Pt states L knee is her primary issue. BIL knees at times. No recalled MOI or change in activity. Seems to be slowly worsening over time. Symptoms are variable. Pt states she will often have pain at the end of the day regardless of activity levels. Has received gel injection in L knee with some relief. At one point was having pain down the leg but now localized more so to knee. No n/t, denies any significant issues with swelling. Did have US  to look at circulation issues, states it went well. Typically walks the dog with her husband, has  had to reduce activity. Pt accompanied by spouse per her request.   PERTINENT HISTORY: afib, PVD, thyroid  disease, osteopenia  PAIN:  Are you having pain: yes;   NPRS: 6/10  Location/description: BIL knee, aching, L > R - aggravating factors: standing  - Easing factors: injections, compression socks     PRECAUTIONS: None  RED FLAGS: None   WEIGHT BEARING RESTRICTIONS: No  FALLS:  Has patient fallen in last 6 months?  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  DIAGNOSTIC FINDINGS:  11/29/23 BIL knee XR without fracture or dislocations per chart review, please refer to EPIC for details  PATIENT SURVEYS:  LEFS score not recorded - some answers deferred  COGNITION: Overall cognitive status: Within functional limits for tasks assessed     SENSATION: Does not endorse any sensory complaints  EDEMA:  Mild pocket of swelling anterolateral knees BIL, L more so than R    PALPATION: Tenderness about L knee joint, more so anterolaterally  LOWER EXTREMITY ROM:     Active  Right eval Left eval Left 04/30/24  Hip flexion     Hip extension     Hip internal rotation     Hip external rotation     Knee extension     Knee flexion 119 deg 104 deg * 120  (Blank rows = not tested) (Key: WFL = within functional limits not formally assessed, * = concordant pain, s = stiffness/stretching sensation, NT = not tested)  Comments:    LOWER EXTREMITY MMT:    MMT Right eval Left eval R 04/30/24 L  04/30/24  Hip flexion 4 4- *    Hip abduction (modified sitting) 4 4    Hip internal rotation      Hip external rotation      Knee flexion 4 3+ 4+/5 4/5  Knee extension 4 4-    Ankle dorsiflexion 4 4     (Blank rows = not tested) (Key: WFL = within functional limits not formally assessed, * = concordant pain, s = stiffness/stretching sensation, NT = not tested)  Comments:    FUNCTIONAL TESTS:  5xSTS: 13.90sec w/ UE support at thighs, L knee pain, tendency towards posterior LOB  04/30/24:  5x STS: 12.88 sec with UE support on chair, 1st attempt with post LOB   GAIT: Distance walked: within clinic Assistive device  utilized: None Level of assistance: Complete Independence Comments: mild antalgic gait LLE                                                                                                                                TREATMENT DATE:  05/06/24 Nustep L5: 6.5  minutes  for conditioning and warm up STS with controlled descent x 10 - first 5 from NuStep seat, 2nd 5 from table LAQ 4# RLE, 3# LLE x 15 each - cues to slow speed-> 5 more reps holding in ext 3 sec. Side stepping at rail with red band on thighs above knees with light UE support 27ft R/L x 2, then side marching R/L Heel raises x 10; toe raises x 10 Forward step ups 6" x 10 each LE SLS with 3 way toe tap with intermittent UE support (challenge) Marching on airex pad, intermittent support (challenge)  R/L calf stretch (cues for form), x 15s each Seated hamstring stretch x 15s each  04/30/24 Nustep 5 minutes  for conditioning and warm up MMT knee flexion STS test, then STS with controlled descent x 10 LAQ 3#,  2 x 10 each LE Heel raises x 25 Forward step ups 4" x 10 each LE Lateral step ups onto airex x 10, light UE support Forward step over and back (over 1/2 foam roller); side stepping over 1/2 foam roller Marching on airex pad, challenge  R/L calf stretch (cues for form and not bouncing)   04/22/24 Nustep 5 minutes at end of session for conditioning LAQ 2# 3 x 10 Standing hip abduction RTB at knees 3 x 10  Step up 4 inch 2 x 10 Lateral step up 4 inch 2 x 10  Alternating march on airex unilateral HHA 1 x 15 Lateral stepping 6 x 12 feet   04/17/24 LAQ 1.5# 3 x 10 Standing hip abduction 1.5# 3 x 10 Standing hip extension 1.5# 3 x 10 Step up 4 inch 1 x 10 Lateral step up 4 inch 1 x 10  Standing hamstring curls 2# 2 x 10 NBOS on airex 2 x 30 second holds Alternating march on airex unilateral HHA 1 x 15 STS 3 x 5 Nustep 5 minutes at end of session for conditioning   04/03/24 LAQ 1# 2 x 10 HR 1 x 15 TR 1 x 15 Standing  hip abduction 2 x 10 Standing hip extension 2 x 10 STS 3 x 5 Step up 4 inch 1 x 10 Standing hamstring curls 2 x 10 Lateral stepping 3 x 15 feet bilateral    [facility] Adult PT Treatment:                                                DATE: 03/27/24 Therapeutic Exercise: LAQ x12 BIL cues for quad contraction LAQ 1# x8 BIL Heel/toe raises at counter 2x12 Standing hip abd 2x5 BIL cues for posture  Standing hip ext2x5 BIL cues for posture HEP update + education/handout  Therapeutic Activity: STS 4x5 w/ UE fwd for reduced posterior weight shift , excellent improvement w/ repetition ; significant time spent w/ education and cues 4 inch step up x5 BIL UE support    [facility] Adult PT Treatment:                                                DATE: 03/20/24 Therapeutic Exercise: LAQ 2x8 BIL cues for full ROM and foot positioning Hip 45 deg kickback standing 2x8 BIL w/ UE support Standing hamstring curl x10 BIL requires cues for  pacing Heel/toe raises, standing w/ UE support x12 BIL  HEP discussion/education and rationale for interventions   Neuromuscular re-ed: Quad set 10sec hold x12 BIL tactile cues for quad activation STS 3x5 from mat, emphasis on quad activation and mitigating posterior weight shift  TKE push into bosu for quad activation 2x10 BIL     03/10/24 Quad set 10 x 10 second holds Heel slides 10 x 5-10 second holds on L SLR 1 x 10  Sidelying clam 2 x 10 LAQ 1 x 10 x 5 second holds STS 2 x 5 Standing hamstring curls 3 x 10   [facility] Adult PT Treatment:                                                DATE: 03/06/24 Therapeutic Exercise: Practice reps quads, heel slides, and STS w/ education on setup, home performance, appropriate mechanics/safety ; HEP handout provided    PATIENT EDUCATION:  Education details: rationale for interventions Person educated: Patient Education method: Explanation, Demonstration, Tactile cues, Verbal cues Education comprehension: verbalized  understanding, returned demonstration, verbal cues required, tactile cues required, and needs further education     HOME EXERCISE PROGRAM: Access Code: [email or web address] URL: [email or web address] Date: 03/27/2024 Prepared by: Mayme Spearman  Exercises - Seated Heel Slide  - 2-3 x daily - 1 sets - 8 reps - Sit to Stand with Armchair  - 2-3 x daily - 1 sets - 5 reps - Seated Long Arc Quad  - 2-3 x daily - 10 reps - 5 second hold - Heel Raises with Counter Support  - 2-3 x daily - 1 sets - 10 reps - Standing Hip Abduction with Counter Support  - 2-3 x daily - 1 sets - 5 reps  ASSESSMENT:  CLINICAL IMPRESSION: Pt observed with most weight in LLE with STS; slightly improved with staggered stance with L foot forward.  Her balance is challenged with SLS exercises near counter.  She reported no increase in pain during session, and reported reduction in tightness in LE afterwards.    Pt has partially met her goals.  Patient progressing well with increased resistance and reps of previously completed exercises.  Patient will continue to benefit from skilled physical therapy in order to improve function and reduce impairment.     EVAL: Patient is a 83 y.o. woman who was seen today for physical therapy evaluation and treatment for BIL knee pain, L>R that is reportedly limiting tolerance to basic mobility, reducing overall activity levels. On exam pt demonstrates concordant limitations in LE strength, quad activation, and knee mobility, all of which are likely contributing to symptoms and observed alterations in gait/transfer mechanics. Pt tolerates exam/HEP well without adverse event or increase in resting pain. Recommend trial of skilled PT to address aforementioned deficits with aim of improving functional tolerance and reducing pain with typical activities. Pt departs today's session in no acute distress, all voiced concerns/questions addressed appropriately from PT perspective.    OBJECTIVE  IMPAIRMENTS: Abnormal gait, decreased activity tolerance, decreased balance, decreased endurance, decreased mobility, difficulty walking, decreased ROM, decreased strength, improper body mechanics, and pain.   ACTIVITY LIMITATIONS: carrying, lifting, standing, squatting, sleeping, stairs, transfers, and locomotion level  PARTICIPATION LIMITATIONS: meal prep, cleaning, laundry, and community activity  PERSONAL FACTORS: Age, Time since onset of injury/illness/exacerbation, and 3+ comorbidities: afib, PVD, thyroid  disease, osteopenia are also affecting patient's  functional outcome.   REHAB POTENTIAL: Good  CLINICAL DECISION MAKING: Evolving/moderate complexity  EVALUATION COMPLEXITY: Moderate   GOALS:  SHORT TERM GOALS: Target date: 04/03/2024  Pt will demonstrate appropriate understanding and performance of initially prescribed HEP in order to facilitate improved independence with management of symptoms.  Baseline: HEP established  Goal status: MET  -04/30/24  2. Pt will report at least 25% improvement in overall pain levels over past week in order to facilitate improved tolerance to typical daily activities.   Baseline:   Goal status: MET - 04/30/24  LONG TERM GOALS: Target date: 05/01/2024  Pt will report at least 50% decrease in overall pain levels in past week in order to facilitate improved tolerance to basic ADLs/mobility.   Baseline: 6-7/10 oneval  Goal status: MET 04/30/24  2.  Pt will demonstrate at least 0-120 degrees of knee AROM BIL in order to facilitate improved tolerance to functional movements such as squatting/transfers.  Baseline: see ROM chart above Goal status: MET- 04/30/24  3.  Pt will demonstrate at least 4+/5 knee flex/ext MMT BIL in order to facilitate improved functional strength. Baseline: see MMT chart above Goal status:In progress - 04/30/24  4.  Pt will be able to perform 5xSTS in less than or equal to 10sec in order to demonstrate reduced fall risk and  improved functional independence (MCID 5xSTS = 2.3 sec). Baseline: see above  Goal status: IN PROGRESS - 04/30/24   5. Pt will demonstrate appropriate performance of final prescribed HEP in order to facilitate improved self-management of symptoms post-discharge.   Baseline: initial HEP prescribed  Goal status: IN PROGRESS   6. Pt will endorse ability to walk her dog with her husband for at least 20 min with less than 3pt increase in knee pain in order to facilitate improved tolerance to community navigation.   Baseline:   Goal status: MET - can walk 30 min.    PLAN:  PT FREQUENCY: 1-2x/week  PT DURATION: 8 weeks  PLANNED INTERVENTIONS: 97164- PT Re-evaluation, 97110-Therapeutic exercises, 97530- Therapeutic activity, [id number]- Neuromuscular re-education, 97535- Self Care, 14782- Manual therapy, [phone]- Gait training, Patient/Family education, Balance training, Stair training, Taping, Dry Needling, Joint mobilization, Cryotherapy, and Moist heat  PLAN FOR NEXT SESSION: Review/update HEP PRN. Work on [employer] exercises as appropriate with emphasis on quad activation, knee flex mobility, functional mechanics. Symptom modification strategies as indicated/appropriate.   Almedia Jacobsen, PTA 05/06/24 11:42 AM [facility] [address] [place], [place], [address] Phone: [phone]   Fax:  [phone]

## 2024-05-06 NOTE — Patient Instructions (Signed)
 Orders for TSH to check your thyroid  action and a BMP to check your kidney function were placed.

## 2024-05-06 NOTE — Progress Notes (Signed)
 Established Patient Office Visit   Subjective  Patient ID: Paige Levy, female    DOB: 1941/08/07  Age: 83 y.o. MRN: [id number]  Chief Complaint  Patient presents with   Medical Management of Chronic Issues  Patient accompanied by her husband.  Patient is an 83 year old female seen for follow-up and refills.  Requesting refill on Maxide-25.  Sent to pharmacy earlier this week.  Patient states she is doing well overall.  Only having issues with left knee.  In PT which seems to be helping.  Had injections which also seem to help.  No current issues with A-fib.    Patient Active Problem List   Diagnosis Date Noted   Osteopenia 10/30/2023   Neoplasm of uncertain behavior of skin 08/28/2022   Secondary hypercoagulable state (HCC) 03/29/2021   Current use of long term anticoagulation 03/29/2021   Essential hypertension 03/29/2021   Pure hypercholesterolemia 03/29/2021   Atrial fibrillation (HCC) 02/08/2021   Pulsatile tinnitus of right ear 01/01/2020   Submandibular lymphadenopathy 11/18/2019   Chronic rhinitis 04/27/2016   Hearing loss 04/27/2016   Active cochlear Meniere's disease, bilateral 01/17/2015   Meniere's disease 10/01/2014   Dyslipidemia 10/01/2014   Hypothyroidism 11/09/2013   Osteoarthritis 11/09/2013   Hyperlipidemia 11/09/2013   Past Medical History:  Diagnosis Date   Arthritis    Atrial fibrillation (HCC)    Hyperlipemia    Hypothyroidism    Meniere's disease    Peripheral vascular disease (HCC)    Thyroid  disease    Past Surgical History:  Procedure Laterality Date   ABDOMINAL HYSTERECTOMY     CARDIOVERSION N/A 03/21/2021   Procedure: CARDIOVERSION;  Surgeon: Luana Rumple, MD;  Location: [facility] ENDOSCOPY;  Service: Cardiovascular;  Laterality: N/A;   CARDIOVERSION N/A 07/05/2022   Procedure: CARDIOVERSION;  Surgeon: Sheryle Donning, MD;  Location: [facility] ENDOSCOPY;  Service: Cardiovascular;  Laterality: N/A;   ERCP Left 10/03/2014   Procedure:  ENDOSCOPIC RETROGRADE CHOLANGIOPANCREATOGRAPHY (ERCP);  Surgeon: Barbie Boon, MD;  Location: [facility] ENDOSCOPY;  Service: Gastroenterology;  Laterality: Left;   ERCP N/A 01/14/2015   Procedure: ENDOSCOPIC RETROGRADE CHOLANGIOPANCREATOGRAPHY (ERCP);  Surgeon: Mathew Solomon, MD;  Location: [facility] ENDOSCOPY;  Service: Endoscopy;  Laterality: N/A;  need lithotripter/ordered/LH   SPHINCTEROTOMY  09/2014   SPYGLASS LITHOTRIPSY N/A 01/14/2015   Procedure: MWNUUVOZ LITHOTRIPSY;  Surgeon: Mathew Solomon, MD;  Location: [facility] ENDOSCOPY;  Service: Endoscopy;  Laterality: N/A;   Social History   Tobacco Use   Smoking status: Never   Smokeless tobacco: Never  Vaping Use   Vaping status: Never Used  Substance Use Topics   Alcohol use: No   Drug use: No   Family History  Problem Relation Age of Onset   Other Mother    Heart attack Mother    Other Father    Allergies  Allergen Reactions   Ursodiol  Other (See Comments)    Severe Dizziness   Rosuvastatin  Other (See Comments)    Leg pain    ROS Negative unless stated above    Objective:      BP 126/74 (BP Location: Left Arm, Patient Position: Sitting, Cuff Size: Normal)   Pulse (!) 136   Temp 98.4 F (36.9 C) (Oral)   Ht 5\' 6"  (1.676 m)   Wt 160 lb 6.4 oz (72.8 kg)   SpO2 99%   BMI 25.89 kg/m  BP Readings from Last 3 Encounters:  05/06/24 126/74  01/22/24 (!) 146/76  11/15/23 132/68   Wt Readings from Last 3 Encounters:  05/06/24 160 lb 6.4 oz (72.8 kg)  01/22/24 153 lb 12.8 oz (69.8 kg)  11/15/23 156 lb 9.6 oz (71 kg)      Physical Exam Constitutional:      General: She is not in acute distress.    Appearance: Normal appearance.  HENT:     Head: Normocephalic and atraumatic.     Nose: Nose normal.     Mouth/Throat:     Mouth: Mucous membranes are moist.  Cardiovascular:     Rate and Rhythm: Normal rate and regular rhythm.     Heart sounds: Normal heart sounds. No murmur heard.    No gallop.  Pulmonary:     Effort: Pulmonary  effort is normal. No respiratory distress.     Breath sounds: Normal breath sounds. No wheezing, rhonchi or rales.  Musculoskeletal:     Right lower leg: No edema.     Left lower leg: No edema.  Skin:    General: Skin is warm and dry.  Neurological:     Mental Status: She is alert and oriented to person, place, and time.        11/15/2023    1:46 PM 09/25/2023   10:50 AM 08/22/2023    1:18 PM  Depression screen PHQ 2/9  Decreased Interest 0 0 1  Down, Depressed, Hopeless 0 0 0  PHQ - 2 Score 0 0 1  Altered sleeping 0 1 0  Tired, decreased energy 3 1 0  Change in appetite 0 0 3  Feeling bad or failure about yourself  0 0 0  Trouble concentrating 0 0 0  Moving slowly or fidgety/restless 0 0 0  Suicidal thoughts 0 0 0  PHQ-9 Score 3 2 4   Difficult doing work/chores Not difficult at all Not difficult at all Not difficult at all      11/15/2023    1:46 PM 09/25/2023   10:50 AM 08/22/2023    1:18 PM 04/09/2023   11:38 AM  GAD 7 : Generalized Anxiety Score  Nervous, Anxious, on Edge 0 0 0 0  Control/stop worrying 2 0 0 0  Worry too much - different things 0 0 0 0  Trouble relaxing 0 0 0 0  Restless 0 0 0 0  Easily annoyed or irritable 1 0 1 0  Afraid - awful might happen 0 0 0 0  Total GAD 7 Score 3 0 1 0  Anxiety Difficulty Somewhat difficult Not difficult at all Not difficult at all Not difficult at all     No results found for any visits on 05/06/24.    Assessment & Plan:   Essential hypertension -     TSH -     Basic metabolic panel with GFR  Acquired hypothyroidism -     TSH  Paroxysmal A-fib  Left knee pain  Chronic conditions stable.  Recheck TSH and BMP.  Continue Synthroid  88 mcg.  BP controlled.  Triamterene -hydrochlorothiazide  37.5-25 mg daily refilled earlier this week.  Additional refills at this visit.  A-fib stable.  Continue amiodarone  200 mg daily, Eliquis  5 mg twice daily, and diltiazem  30 mg daily as needed for heart rate above 120 bpm.   Continue follow-up with cardiology.  Continue follow-up with Ortho and PT for left knee pain.  Return in about 6 months (around 11/06/2024).   Viola Greulich, MD

## 2024-05-07 ENCOUNTER — Other Ambulatory Visit: Payer: Self-pay | Admitting: Family Medicine

## 2024-05-07 ENCOUNTER — Ambulatory Visit: Payer: Self-pay | Admitting: Family Medicine

## 2024-05-07 DIAGNOSIS — R7989 Other specified abnormal findings of blood chemistry: Secondary | ICD-10-CM

## 2024-05-10 ENCOUNTER — Other Ambulatory Visit: Payer: Self-pay

## 2024-05-10 ENCOUNTER — Emergency Department (HOSPITAL_BASED_OUTPATIENT_CLINIC_OR_DEPARTMENT_OTHER)

## 2024-05-10 ENCOUNTER — Emergency Department (HOSPITAL_BASED_OUTPATIENT_CLINIC_OR_DEPARTMENT_OTHER)
Admission: EM | Admit: 2024-05-10 | Discharge: 2024-05-11 | Disposition: A | Discharge status: Home / Self Care | Attending: Emergency Medicine | Admitting: Emergency Medicine

## 2024-05-10 DIAGNOSIS — M47812 Spondylosis without myelopathy or radiculopathy, cervical region: Secondary | ICD-10-CM | POA: Diagnosis not present

## 2024-05-10 DIAGNOSIS — M4802 Spinal stenosis, cervical region: Secondary | ICD-10-CM | POA: Diagnosis not present

## 2024-05-10 DIAGNOSIS — N390 Urinary tract infection, site not specified: Secondary | ICD-10-CM

## 2024-05-10 DIAGNOSIS — S199XXA Unspecified injury of neck, initial encounter: Secondary | ICD-10-CM | POA: Diagnosis not present

## 2024-05-10 DIAGNOSIS — W010XXA Fall on same level from slipping, tripping and stumbling without subsequent striking against object, initial encounter: Secondary | ICD-10-CM | POA: Diagnosis not present

## 2024-05-10 DIAGNOSIS — R319 Hematuria, unspecified: Secondary | ICD-10-CM | POA: Diagnosis not present

## 2024-05-10 DIAGNOSIS — Z79899 Other long term (current) drug therapy: Secondary | ICD-10-CM | POA: Insufficient documentation

## 2024-05-10 DIAGNOSIS — I700 Atherosclerosis of aorta: Secondary | ICD-10-CM | POA: Diagnosis not present

## 2024-05-10 DIAGNOSIS — S3993XA Unspecified injury of pelvis, initial encounter: Secondary | ICD-10-CM | POA: Diagnosis not present

## 2024-05-10 DIAGNOSIS — I10 Essential (primary) hypertension: Secondary | ICD-10-CM | POA: Diagnosis not present

## 2024-05-10 DIAGNOSIS — Z7901 Long term (current) use of anticoagulants: Secondary | ICD-10-CM | POA: Insufficient documentation

## 2024-05-10 DIAGNOSIS — W19XXXA Unspecified fall, initial encounter: Secondary | ICD-10-CM

## 2024-05-10 DIAGNOSIS — S299XXA Unspecified injury of thorax, initial encounter: Secondary | ICD-10-CM | POA: Diagnosis not present

## 2024-05-10 DIAGNOSIS — I4891 Unspecified atrial fibrillation: Secondary | ICD-10-CM | POA: Diagnosis not present

## 2024-05-10 DIAGNOSIS — S0990XA Unspecified injury of head, initial encounter: Secondary | ICD-10-CM | POA: Diagnosis not present

## 2024-05-10 DIAGNOSIS — M8580 Other specified disorders of bone density and structure, unspecified site: Secondary | ICD-10-CM | POA: Diagnosis not present

## 2024-05-10 DIAGNOSIS — S3991XA Unspecified injury of abdomen, initial encounter: Secondary | ICD-10-CM | POA: Diagnosis not present

## 2024-05-10 LAB — URINALYSIS, ROUTINE W REFLEX MICROSCOPIC
Bilirubin Urine: NEGATIVE
Glucose, UA: NEGATIVE mg/dL
Nitrite: POSITIVE — AB
Protein, ur: 30 mg/dL — AB
RBC / HPF: >50 RBC/hpf (ref 0–5)
Specific Gravity, Urine: 1.027 (ref 1.005–1.030)
WBC, UA: >50 WBC/hpf (ref 0–5)
pH: 6.5 (ref 5.0–8.0)

## 2024-05-10 LAB — COMPREHENSIVE METABOLIC PANEL WITH GFR
ALT: 24 U/L (ref 0–44)
AST: 35 U/L (ref 15–41)
Albumin: 4.3 g/dL (ref 3.5–5.0)
Alkaline Phosphatase: 81 U/L (ref 38–126)
Anion gap: 13 (ref 5–15)
BUN: 15 mg/dL (ref 8–23)
CO2: 27 mmol/L (ref 22–32)
Calcium: 9.5 mg/dL (ref 8.9–10.3)
Chloride: 98 mmol/L (ref 98–111)
Creatinine, Ser: 1.23 mg/dL — ABNORMAL HIGH (ref 0.44–1.00)
GFR, Estimated: 43 mL/min — ABNORMAL LOW (ref >60)
Glucose, Bld: 109 mg/dL — ABNORMAL HIGH (ref 70–99)
Potassium: 3.5 mmol/L (ref 3.5–5.1)
Sodium: 138 mmol/L (ref 135–145)
Total Bilirubin: 0.8 mg/dL (ref 0.0–1.2)
Total Protein: 7.5 g/dL (ref 6.5–8.1)

## 2024-05-10 LAB — CBC WITH DIFFERENTIAL/PLATELET
Abs Immature Granulocytes: 0.14 10*3/uL — ABNORMAL HIGH (ref 0.00–0.07)
Basophils Absolute: 0 10*3/uL (ref 0.0–0.1)
Basophils Relative: 1 %
Eosinophils Absolute: 0.1 10*3/uL (ref 0.0–0.5)
Eosinophils Relative: 1 %
HCT: 42.9 % (ref 36.0–46.0)
Hemoglobin: 14.3 g/dL (ref 12.0–15.0)
Immature Granulocytes: 2 %
Lymphocytes Relative: 19 %
Lymphs Abs: 1.4 10*3/uL (ref 0.7–4.0)
MCH: 32.3 pg (ref 26.0–34.0)
MCHC: 33.3 g/dL (ref 30.0–36.0)
MCV: 96.8 fL (ref 80.0–100.0)
Monocytes Absolute: 0.5 10*3/uL (ref 0.1–1.0)
Monocytes Relative: 6 %
Neutro Abs: 5.4 10*3/uL (ref 1.7–7.7)
Neutrophils Relative %: 71 %
Platelets: 217 10*3/uL (ref 150–400)
RBC: 4.43 MIL/uL (ref 3.87–5.11)
RDW: 13 % (ref 11.5–15.5)
WBC: 7.5 10*3/uL (ref 4.0–10.5)
nRBC: 0 % (ref 0.0–0.2)

## 2024-05-10 LAB — APTT: aPTT: 28 s (ref 24–36)

## 2024-05-10 LAB — PROTIME-INR
INR: 1 (ref 0.8–1.2)
Prothrombin Time: 13.7 s (ref 11.4–15.2)

## 2024-05-10 MED ORDER — IOHEXOL 300 MG/ML  SOLN
100.0000 mL | Freq: Once | INTRAMUSCULAR | Status: AC | PRN
  Administered 2024-05-10: 80 mL via INTRAVENOUS

## 2024-05-10 NOTE — ED Notes (Signed)
 Pt ambulated to and from bathroom with steady gait. Placed back on monitor. Denies dizziness/HA/CP/SOB. Awaiting CT scan results.

## 2024-05-10 NOTE — ED Notes (Signed)
 Patient denies pain and is resting comfortably.

## 2024-05-10 NOTE — ED Triage Notes (Signed)
 Pt POV after mechanical fall in the yard, fell backwards on to bottom, denies hitting head, is on Eliquis . Reports lower back pain and hematuria after fall.

## 2024-05-10 NOTE — ED Provider Notes (Signed)
 Care of patient received from prior provider at 4:40 AM, please see their note for complete H/P and care plan.  Received handoff per ED course.  Clinical Course as of 05/11/24 0440  Sun May 10, 2024  2326 IRafael Bun DO, am transitioning care of this patient to the oncoming provider pending CT reads, reevaluation and disposition Patient remains hemodynamically stable at this time Laboratory workup shows renal function at baseline.  Hematuria question UTI. [MP]  2329 Stable  Fall on thinners getting trauma protocol 2/2 hematuria. On Eliquis  CT pendings. [CC]    Clinical Course User Index [CC] Onetha Bile, MD [MP] Sallyanne Creamer, DO    Reassessment: Urinalysis appears to show urinary tract infection.  CTs without focal pathology.  Hematuria becoming less frequent per patient's description.  Discussed observation but patient feels comfortable discharge at this time.  Pain under control.  Patient ambulatory tolerating p.o. intake at this time.  Strict follow-up with PCP reinforced.   Onetha Bile, MD 05/11/24 [phone]

## 2024-05-10 NOTE — ED Provider Notes (Signed)
 [facility] AT [facility] Provider Note   CSN: [id number] Arrival date & time: 05/10/24  1939     History  Chief Complaint  Patient presents with   Paige Levy is a 83 y.o. female.  With a history of atrial fibrillation on Eliquis , hypertension and Mnire's disease who presents to the ED after fall.  Patient slipped on wet grass in her yard and fell backwards onto her bottom.  No head trauma or loss of consciousness.  Now with pain in her right bottom.  She became concerned when she went inside and use the restroom and noticed hematuria.  No other complaints at this time.  Last dose of Eliquis  was this morning   Fall       Home Medications Prior to Admission medications   Medication Sig Start Date End Date Taking? Authorizing Provider  acetaminophen  (TYLENOL ) 500 MG tablet Take 1 tablet (500 mg total) by mouth every 6 (six) hours as needed. 04/02/23   Deatra Face, MD  amiodarone  (PACERONE ) 200 MG tablet Take 1 tablet (200 mg total) by mouth daily. 06/04/23   Sheryle Donning, MD  diltiazem  (CARDIZEM ) 30 MG tablet TAKE 1 TABLET DAILY AS NEEDED FOR HEART RATE ABOVE 120 BEATS PER MINUTE (RAPID ATRIAL FIBRILLATION) 06/06/23   Sheryle Donning, MD  ELIQUIS  5 MG TABS tablet TAKE 1 TABLET BY MOUTH TWICE A DAY 02/10/24   Sheryle Donning, MD  levothyroxine  (SYNTHROID ) 88 MCG tablet TAKE 1 TABLET BY MOUTH EVERY DAY BEFORE BREAKFAST 01/24/24   Allwardt, Alyssa M, PA-C  Multiple Vitamin (MULTIVITAMIN WITH MINERALS) TABS tablet Take 2 tablets by mouth daily. Gummies    [provider]  olopatadine (PATADAY) 0.1 % ophthalmic solution Place 1 drop into both eyes daily as needed for allergies.    [provider]  Polyethyl Glycol-Propyl Glycol (SYSTANE OP) Place 1 drop into both eyes 2 (two) times daily as needed (dry eyes).    [provider]  Potassium Gluconate 550 MG TABS Take 550 mg by mouth daily.     [provider]  triamterene -hydrochlorothiazide  (MAXZIDE -25) 37.5-25 MG tablet Take 1 tablet by mouth daily. 05/06/24   Viola Greulich, MD      Allergies    Ursodiol  and Rosuvastatin     Review of Systems   Review of Systems  Physical Exam Updated Vital Signs BP (!) 149/62 (BP Location: Right Arm)   Pulse (!) 58   Temp 98.4 F (36.9 C) (Oral)   Resp 17   SpO2 99%  Physical Exam Vitals and nursing note reviewed.  HENT:     Head: Normocephalic and atraumatic.  Eyes:     Pupils: Pupils are equal, round, and reactive to light.  Cardiovascular:     Rate and Rhythm: Normal rate and regular rhythm.  Pulmonary:     Effort: Pulmonary effort is normal.     Breath sounds: Normal breath sounds.  Abdominal:     Palpations: Abdomen is soft.     Tenderness: There is no abdominal tenderness.  Musculoskeletal:     Cervical back: Neck supple. No tenderness.     Comments: Some tenderness over the right buttock with no bruising or other evidence of trauma No midline tenderness step-off deformity 5 out of 5 motor strength bilateral upper and lower extremities Sensation tact light touch throughout bilateral upper and lower extremities Distal pulses 2+ bilateral radial and DP   Skin:    General: Skin is warm and dry.  Neurological:     Mental Status: She is alert.  Psychiatric:        Mood and Affect: Mood normal.     ED Results / Procedures / Treatments   Labs (all labs ordered are listed, but only abnormal results are displayed) Labs Reviewed  COMPREHENSIVE METABOLIC PANEL WITH GFR - Abnormal; Notable for the following components:      Result Value   Glucose, Bld 109 (*)    Creatinine, Ser 1.23 (*)    GFR, Estimated 43 (*)    All other components within normal limits  CBC WITH DIFFERENTIAL/PLATELET - Abnormal; Notable for the following components:   Abs Immature Granulocytes 0.14 (*)    All other components within normal limits  URINALYSIS, ROUTINE W REFLEX  MICROSCOPIC - Abnormal; Notable for the following components:   APPearance HAZY (*)    Hgb urine dipstick LARGE (*)    Ketones, ur TRACE (*)    Protein, ur 30 (*)    Nitrite POSITIVE (*)    Leukocytes,Ua LARGE (*)    Bacteria, UA FEW (*)    Crystals PRESENT (*)    All other components within normal limits  APTT  PROTIME-INR    EKG None  Radiology No results found.  Procedures Ultrasound ED FAST  Date/Time: 05/10/2024 9:28 PM  Performed by: Sallyanne Creamer, DO Authorized by: Sallyanne Creamer, DO  Procedure details:    Indications: blunt abdominal trauma and blunt chest trauma       Assess for:  Hemothorax, intra-abdominal fluid, pericardial effusion and pneumothorax    Technique:  Abdominal, cardiac and chest    Images: archived      Abdominal findings:    L kidney:  Visualized   R kidney:  Visualized   Liver:  Visualized    Bladder:  Visualized, Foley catheter not visualized   Hepatorenal space visualized: identified     Rectovesical free fluid: not identified     Splenorenal free fluid: not identified     Hepatorenal space free fluid: not identified   Cardiac findings:    Heart:  Visualized   Wall motion: identified     Pericardial effusion: not identified   Chest findings:    L lung sliding: identified     R lung sliding: identified     Fluid in thorax: not identified   Comments:     E-FAST negative     Medications Ordered in ED Medications  iohexol  (OMNIPAQUE ) 300 MG/ML solution 100 mL (80 mLs Intravenous Contrast Given 05/10/24 2117)    ED Course/ Medical Decision Making/ A&P Clinical Course as of 05/10/24 2354  Sun May 10, 2024  2326 Teresia Fennel DO, am transitioning care of this patient to the oncoming provider pending CT reads, reevaluation and disposition Patient remains hemodynamically stable at this time Laboratory workup shows renal function at baseline.  Hematuria question UTI. [MP]  2329 Stable  Fall on thinners getting trauma  protocol 2/2 hematuria. On Eliquis  CT pendings. [CC]    Clinical Course User Index [CC] Onetha Bile, MD [MP] Sallyanne Creamer, DO                                 Medical Decision Making 83 year old female presenting for fall on thinners.  Eliquis  for A-fib.  Fell backwards mechanical in nature fell backwards onto bottom.  Hematuria after the fall.  Hemodynamically stable.  E-FAST negative.  No traumatic  findings on my exam.  Given fall on Eliquis  will obtain CT head C-spine chest abdomen pelvis to look for traumatic injury.  Will obtain basic laboratory workup and EKG as well to look for underlying cause of fall although she has a great story for mechanical fall.  Will consider reversal of there is evidence of active bleeding  Amount and/or Complexity of Data Reviewed Labs: ordered. Radiology: ordered.  Risk Prescription drug management.           Final Clinical Impression(s) / ED Diagnoses Final diagnoses:  Fall, initial encounter  Hematuria, unspecified type    Rx / DC Orders ED Discharge Orders     None         Sallyanne Creamer, DO 05/10/24 2354

## 2024-05-11 ENCOUNTER — Ambulatory Visit: Payer: Self-pay

## 2024-05-11 MED ORDER — CEPHALEXIN 250 MG PO CAPS
500.0000 mg | ORAL_CAPSULE | Freq: Once | ORAL | Status: AC
  Administered 2024-05-11: 500 mg via ORAL
  Filled 2024-05-11: qty 2

## 2024-05-11 MED ORDER — OXYCODONE HCL 5 MG PO TABS
5.0000 mg | ORAL_TABLET | Freq: Once | ORAL | Status: AC
  Administered 2024-05-11: 5 mg via ORAL
  Filled 2024-05-11: qty 1

## 2024-05-11 MED ORDER — CEPHALEXIN 500 MG PO CAPS: 500.0000 mg | ORAL_CAPSULE | Freq: Three times a day (TID) | ORAL | 0 refills | Status: DC

## 2024-05-11 NOTE — ED Notes (Signed)
 Pt sitting up in bed, RR equal and unlabored. Husband at bedside. VS WNL. Awaiting dispo.

## 2024-05-11 NOTE — Telephone Encounter (Signed)
 Copied from CRM [id number]. Topic: Clinical - Red Word Triage >> May 11, 2024  9:08 AM Hassie Lint wrote: Red Word that prompted transfer to Nurse Triage: Patient had a fall yesterday evening and went to the ER, where they stated she had internal bleeding and was treated for back pain. Was released from the ER around 2am, stated the bleeding had stopped, but patient is still experiencing back pain and husband Ozzie Board is concerned about the bleeding.  Called to make a f/u apt after being seen in the ER last night for a fall. Care advice given, denies questions; instructed to go to ER if becomes worse.

## 2024-05-13 ENCOUNTER — Encounter: Payer: Self-pay | Admitting: Family Medicine

## 2024-05-13 ENCOUNTER — Ambulatory Visit (HOSPITAL_BASED_OUTPATIENT_CLINIC_OR_DEPARTMENT_OTHER): Admitting: Physical Therapy

## 2024-05-13 ENCOUNTER — Ambulatory Visit (INDEPENDENT_AMBULATORY_CARE_PROVIDER_SITE_OTHER): Admitting: Family Medicine

## 2024-05-13 VITALS — BP 122/78 | HR 66 | Temp 97.9°F | Ht 66.0 in | Wt 156.2 lb

## 2024-05-13 DIAGNOSIS — I480 Paroxysmal atrial fibrillation: Secondary | ICD-10-CM

## 2024-05-13 DIAGNOSIS — D171 Benign lipomatous neoplasm of skin and subcutaneous tissue of trunk: Secondary | ICD-10-CM | POA: Diagnosis not present

## 2024-05-13 DIAGNOSIS — M545 Low back pain, unspecified: Secondary | ICD-10-CM

## 2024-05-13 DIAGNOSIS — E038 Other specified hypothyroidism: Secondary | ICD-10-CM | POA: Diagnosis not present

## 2024-05-13 DIAGNOSIS — I10 Essential (primary) hypertension: Secondary | ICD-10-CM | POA: Diagnosis not present

## 2024-05-13 DIAGNOSIS — W010XXD Fall on same level from slipping, tripping and stumbling without subsequent striking against object, subsequent encounter: Secondary | ICD-10-CM

## 2024-05-13 DIAGNOSIS — N3001 Acute cystitis with hematuria: Secondary | ICD-10-CM

## 2024-05-13 DIAGNOSIS — N179 Acute kidney failure, unspecified: Secondary | ICD-10-CM

## 2024-05-13 MED ORDER — MELOXICAM 7.5 MG PO TABS: 7.5000 mg | ORAL_TABLET | Freq: Every day | ORAL | 0 refills | Status: DC

## 2024-05-13 NOTE — Patient Instructions (Addendum)
 Your creatinine in clinic on 05/06/2024 was 1.35.  In the ED on 05/10/2024 it was 1.23.  Your GFR another marker of kidney function was 36.06 in clinic and 43 in the ED.  Continue drinking plenty of water daily.  You can use over-the-counter Salonpas or lidocaine  patches to help with pain.  You can also use heat, Aspercreme or other topical medicines, massage, stretching, and Tylenol .  A prescription for Mobic was sent to your pharmacy.  You can take one pill daily if needed for the discomfort.

## 2024-05-13 NOTE — Progress Notes (Signed)
 Established Patient Office Visit   Subjective  Patient ID: Paige Levy, female    DOB: 1941-11-30  Age: 83 y.o. MRN: [id number]  Chief Complaint  Patient presents with   Medical Management of Chronic Issues    Hospital follow-up for a fall seen 5/18, CT and US  done at that time, patient had a UTI, given Keflex , and back pain rate of pain 8 out of 10   Patient accompanied by her husband.  Patient is an 83 old female seen for ED follow-up.  Patient seen on 05/10/2024 s/p fall.  Patient slipped on wet grass.  On Eliquis .  CT head, C-spine, abdomen pelvis negative.  UA concerning for UTI leuks, nitrites, hemoglobin.  Given Keflex  .  Patient states low back started hurting Sunday.  Pain in back 8/10.  Tried Tylenol  without relief.  Patient most laying around since accident.  Finds it difficult to get up moving.  States recently went to ED was due to seeing blood dripping after fall.  Prior to fall was not having any bleeding.  Never had any UTI symptoms such as dysuria, suprapubic pressure/pain, back pain.     Patient Active Problem List   Diagnosis Date Noted   Osteopenia 10/30/2023   Neoplasm of uncertain behavior of skin 08/28/2022   Secondary hypercoagulable state (HCC) 03/29/2021   Current use of long term anticoagulation 03/29/2021   Essential hypertension 03/29/2021   Pure hypercholesterolemia 03/29/2021   Atrial fibrillation (HCC) 02/08/2021   Pulsatile tinnitus of right ear 01/01/2020   Submandibular lymphadenopathy 11/18/2019   Chronic rhinitis 04/27/2016   Hearing loss 04/27/2016   Active cochlear Meniere's disease, bilateral 01/17/2015   Meniere's disease 10/01/2014   Dyslipidemia 10/01/2014   Hypothyroidism 11/09/2013   Osteoarthritis 11/09/2013   Hyperlipidemia 11/09/2013   Past Medical History:  Diagnosis Date   Arthritis    Atrial fibrillation (HCC)    Hyperlipemia    Hypothyroidism    Meniere's disease    Peripheral vascular disease (HCC)    Thyroid   disease    Past Surgical History:  Procedure Laterality Date   ABDOMINAL HYSTERECTOMY     CARDIOVERSION N/A 03/21/2021   Procedure: CARDIOVERSION;  Surgeon: Luana Rumple, MD;  Location: [facility] ENDOSCOPY;  Service: Cardiovascular;  Laterality: N/A;   CARDIOVERSION N/A 07/05/2022   Procedure: CARDIOVERSION;  Surgeon: Sheryle Donning, MD;  Location: [facility] ENDOSCOPY;  Service: Cardiovascular;  Laterality: N/A;   ERCP Left 10/03/2014   Procedure: ENDOSCOPIC RETROGRADE CHOLANGIOPANCREATOGRAPHY (ERCP);  Surgeon: Barbie Boon, MD;  Location: [facility] ENDOSCOPY;  Service: Gastroenterology;  Laterality: Left;   ERCP N/A 01/14/2015   Procedure: ENDOSCOPIC RETROGRADE CHOLANGIOPANCREATOGRAPHY (ERCP);  Surgeon: Mathew Solomon, MD;  Location: [facility] ENDOSCOPY;  Service: Endoscopy;  Laterality: N/A;  need lithotripter/ordered/LH   SPHINCTEROTOMY  09/2014   SPYGLASS LITHOTRIPSY N/A 01/14/2015   Procedure: WUJWJXBJ LITHOTRIPSY;  Surgeon: Mathew Solomon, MD;  Location: [facility] ENDOSCOPY;  Service: Endoscopy;  Laterality: N/A;   Social History   Tobacco Use   Smoking status: Never   Smokeless tobacco: Never  Vaping Use   Vaping status: Never Used  Substance Use Topics   Alcohol use: No   Drug use: No   Family History  Problem Relation Age of Onset   Other Mother    Heart attack Mother    Other Father    Allergies  Allergen Reactions   Ursodiol  Other (See Comments)    Severe Dizziness   Rosuvastatin  Other (See Comments)    Leg pain  ROS Negative unless stated above    Objective:      BP 122/78 (BP Location: Left Arm, Patient Position: Sitting, Cuff Size: Normal)   Pulse 66   Temp 97.9 F (36.6 C) (Oral)   Ht 5\' 6"  (1.676 m)   Wt 156 lb 3.2 oz (70.9 kg)   SpO2 94%   BMI 25.21 kg/m  BP Readings from Last 3 Encounters:  05/13/24 122/78  05/11/24 (!) 113/95  05/06/24 126/74   Wt Readings from Last 3 Encounters:  05/13/24 156 lb 3.2 oz (70.9 kg)  05/06/24 160 lb 6.4 oz (72.8 kg)  01/22/24 153  lb 12.8 oz (69.8 kg)      Physical Exam Constitutional:      General: She is not in acute distress.    Appearance: Normal appearance.  HENT:     Head: Normocephalic and atraumatic.     Nose: Nose normal.     Mouth/Throat:     Mouth: Mucous membranes are moist.  Cardiovascular:     Rate and Rhythm: Normal rate and regular rhythm.     Heart sounds: Normal heart sounds. No murmur heard.    No gallop.  Pulmonary:     Effort: Pulmonary effort is normal. No respiratory distress.     Breath sounds: Normal breath sounds. No wheezing, rhonchi or rales.  Musculoskeletal:        General: Tenderness present.     Cervical back: Normal.     Thoracic back: Normal.     Lumbar back: Edema and tenderness present.       Back:     Comments: TTP of right low back.  Increased soft fat deposition on right side of low back compared to left.  Skin:    General: Skin is warm and dry.  Neurological:     Mental Status: She is alert and oriented to person, place, and time.        05/06/2024    2:17 PM 11/15/2023    1:46 PM 09/25/2023   10:50 AM  Depression screen PHQ 2/9  Decreased Interest 0 0 0  Down, Depressed, Hopeless 1 0 0  PHQ - 2 Score 1 0 0  Altered sleeping 1 0 1  Tired, decreased energy 1 3 1   Change in appetite 0 0 0  Feeling bad or failure about yourself  0 0 0  Trouble concentrating 0 0 0  Moving slowly or fidgety/restless 0 0 0  Suicidal thoughts 0 0 0  PHQ-9 Score 3 3 2   Difficult doing work/chores  Not difficult at all Not difficult at all      05/06/2024    2:18 PM 11/15/2023    1:46 PM 09/25/2023   10:50 AM 08/22/2023    1:18 PM  GAD 7 : Generalized Anxiety Score  Nervous, Anxious, on Edge 0 0 0 0  Control/stop worrying 0 2 0 0  Worry too much - different things 0 0 0 0  Trouble relaxing 0 0 0 0  Restless 0 0 0 0  Easily annoyed or irritable 0 1 0 1  Afraid - awful might happen 0 0 0 0  Total GAD 7 Score 0 3 0 1  Anxiety Difficulty  Somewhat difficult Not  difficult at all Not difficult at all     No results found for any visits on 05/13/24.    Assessment & Plan:   Acute cystitis with hematuria  Lipoma of back  Essential hypertension  Paroxysmal atrial fibrillation (HCC)  Other specified hypothyroidism  Acute right-sided low back pain without sciatica -     Meloxicam; Take 1 tablet (7.5 mg total) by mouth daily.  Dispense: 10 tablet; Refill: 0  Fall on same level from slipping, subsequent encounter  AKI (acute kidney injury) (HCC)  Imaging from 05/10/2024 with CT cervical spine negative for fracture.  Multilevel degenerative changes noted of C-spine.  CT chest abdomen pelvis with contrast without acute findings.  Aortic atherosclerosis noted and sigmoid diverticulosis.  CT head without acute intercranial abnormality.  Complete course of Keflex .  Increased hydration encouraged.  Recheck renal function next wk. musculoskeletal back pain.  Treat with supportive care including heat, massage, stretching, topical analgesics, Tylenol .  Mobic sparingly given renal function.  Advised against muscle relaxer as may increase fall risk.  Discussed likely duration of symptoms.  Lipoma right lateral low back noted on exam.  Given handout.  Reviewed results from prior office visit regarding renal function.  Mild improvement noted from labs in ED.  Will recheck in 1 to 2 weeks.  For continued decrease in GFR referral to nephrology.  Return if symptoms worsen or fail to improve.   Viola Greulich, MD

## 2024-05-20 ENCOUNTER — Other Ambulatory Visit: Payer: Self-pay | Admitting: Cardiology

## 2024-05-20 ENCOUNTER — Encounter (HOSPITAL_BASED_OUTPATIENT_CLINIC_OR_DEPARTMENT_OTHER): Admitting: Physical Therapy

## 2024-05-20 DIAGNOSIS — I480 Paroxysmal atrial fibrillation: Secondary | ICD-10-CM

## 2024-05-21 ENCOUNTER — Other Ambulatory Visit

## 2024-05-26 ENCOUNTER — Other Ambulatory Visit (INDEPENDENT_AMBULATORY_CARE_PROVIDER_SITE_OTHER)

## 2024-05-26 DIAGNOSIS — R5383 Other fatigue: Secondary | ICD-10-CM

## 2024-05-26 DIAGNOSIS — R7989 Other specified abnormal findings of blood chemistry: Secondary | ICD-10-CM

## 2024-05-26 DIAGNOSIS — E039 Hypothyroidism, unspecified: Secondary | ICD-10-CM | POA: Diagnosis not present

## 2024-05-26 LAB — BASIC METABOLIC PANEL WITH GFR
BUN: 12 mg/dL (ref 6–23)
CO2: 30 meq/L (ref 19–32)
Calcium: 9.3 mg/dL (ref 8.4–10.5)
Chloride: 98 meq/L (ref 96–112)
Creatinine, Ser: 1.04 mg/dL (ref 0.40–1.20)
GFR: 49.29 mL/min — ABNORMAL LOW (ref >60.00)
Glucose, Bld: 89 mg/dL (ref 70–99)
Potassium: 3.4 meq/L — ABNORMAL LOW (ref 3.5–5.1)
Sodium: 137 meq/L (ref 135–145)

## 2024-05-26 LAB — CBC WITH DIFFERENTIAL/PLATELET
Basophils Absolute: 0 10*3/uL (ref 0.0–0.1)
Basophils Relative: 0.5 % (ref 0.0–3.0)
Eosinophils Absolute: 0.1 10*3/uL (ref 0.0–0.7)
Eosinophils Relative: 1.2 % (ref 0.0–5.0)
HCT: 43.1 % (ref 36.0–46.0)
Hemoglobin: 14.5 g/dL (ref 12.0–15.0)
Lymphocytes Relative: 28.4 % (ref 12.0–46.0)
Lymphs Abs: 1.9 10*3/uL (ref 0.7–4.0)
MCHC: 33.7 g/dL (ref 30.0–36.0)
MCV: 94.8 fl (ref 78.0–100.0)
Monocytes Absolute: 0.4 10*3/uL (ref 0.1–1.0)
Monocytes Relative: 6 % (ref 3.0–12.0)
Neutro Abs: 4.2 10*3/uL (ref 1.4–7.7)
Neutrophils Relative %: 63.9 % (ref 43.0–77.0)
Platelets: 276 10*3/uL (ref 150.0–400.0)
RBC: 4.55 Mil/uL (ref 3.87–5.11)
RDW: 13.3 % (ref 11.5–15.5)
WBC: 6.6 10*3/uL (ref 4.0–10.5)

## 2024-05-26 LAB — TSH: TSH: 5.01 u[IU]/mL (ref 0.35–5.50)

## 2024-05-29 ENCOUNTER — Ambulatory Visit: Payer: Self-pay | Admitting: Family Medicine

## 2024-08-05 ENCOUNTER — Other Ambulatory Visit: Payer: Self-pay | Admitting: Cardiology

## 2024-08-05 DIAGNOSIS — I4891 Unspecified atrial fibrillation: Secondary | ICD-10-CM

## 2024-08-05 NOTE — Telephone Encounter (Signed)
 Prescription refill request for Eliquis  received. Indication: PAF Last office visit: 06/01/23  Paige Bruckner MD Scr: 1.04 on 05/26/24  Epic Age: 83 Weight: 69.9kg  Based on above findings Eliquis  5mg  twice daily is the appropriate dose. Pt is past due for appt with MD.  Message sent to schedulers.  Refill approved x 1.

## 2024-08-13 ENCOUNTER — Other Ambulatory Visit: Payer: Self-pay | Admitting: Physician Assistant

## 2024-08-13 ENCOUNTER — Other Ambulatory Visit: Payer: Self-pay | Admitting: Family Medicine

## 2024-08-13 ENCOUNTER — Other Ambulatory Visit: Payer: Self-pay | Admitting: Cardiology

## 2024-08-13 DIAGNOSIS — I480 Paroxysmal atrial fibrillation: Secondary | ICD-10-CM

## 2024-08-13 MED ORDER — LEVOTHYROXINE SODIUM 88 MCG PO TABS: ORAL_TABLET | ORAL | 1 refills | Status: AC

## 2024-08-13 NOTE — Telephone Encounter (Signed)
 Copied from CRM [id number]. Topic: Clinical - Medication Refill >> Aug 13, 2024 11:25 AM Vena HERO wrote: Medication: levothyroxine  (SYNTHROID ) 88 MCG tablet  Has the patient contacted their pharmacy? Yes (Agent: If no, request that the patient contact the pharmacy for the refill. If patient does not wish to contact the pharmacy document the reason why and proceed with request.) (Agent: If yes, when and what did the pharmacy advise?) 08/21 stated to call provider  This is the patient's preferred pharmacy:  [id number] - [place], [place] - [address] [place] [address] Phone: [phone] Fax: [phone]  Is this the correct pharmacy for this prescription? Yes If no, delete pharmacy and type the correct one.   Has the prescription been filled recently? No  Is the patient out of the medication? No  Has the patient been seen for an appointment in the last year OR does the patient have an upcoming appointment? Yes  Can we respond through MyChart? Yes  Agent: Please be advised that Rx refills may take up to 3 business days. We ask that you follow-up with your pharmacy.

## 2024-08-19 ENCOUNTER — Encounter: Payer: Self-pay | Admitting: Family Medicine

## 2024-08-19 ENCOUNTER — Ambulatory Visit: Admitting: Family Medicine

## 2024-08-19 VITALS — BP 130/80 | HR 64 | Temp 97.7°F | Ht 66.0 in | Wt 159.4 lb

## 2024-08-19 DIAGNOSIS — R58 Hemorrhage, not elsewhere classified: Secondary | ICD-10-CM | POA: Diagnosis not present

## 2024-08-19 NOTE — Progress Notes (Signed)
 Established Patient Office Visit   Subjective  Patient ID: Paige Levy, female    DOB: 06-15-1941  Age: 83 y.o. MRN: [id number]  Chief Complaint  Patient presents with   Rash    Patient complains of discolored patches along both forearms x2 weeks, denies use of any new skin products    Pt is an 83 yo female seen for acute concern.  Pt typically accompanied by her husband but her alone today as he is dog sitting their son's dog.  Pt with discoloration on b/l forearms x a wk or so.  Denies pain, pruritus, edema.  Pt does not recall injury.  Taking eliquis  and other meds listed.  No recent med changes.    Patient Active Problem List   Diagnosis Date Noted   Osteopenia 10/30/2023   Neoplasm of uncertain behavior of skin 08/28/2022   Secondary hypercoagulable state (HCC) 03/29/2021   Current use of long term anticoagulation 03/29/2021   Essential hypertension 03/29/2021   Pure hypercholesterolemia 03/29/2021   Atrial fibrillation (HCC) 02/08/2021   Pulsatile tinnitus of right ear 01/01/2020   Submandibular lymphadenopathy 11/18/2019   Chronic rhinitis 04/27/2016   Hearing loss 04/27/2016   Active cochlear Meniere's disease, bilateral 01/17/2015   Meniere's disease 10/01/2014   Dyslipidemia 10/01/2014   Hypothyroidism 11/09/2013   Osteoarthritis 11/09/2013   Hyperlipidemia 11/09/2013   Past Medical History:  Diagnosis Date   Arthritis    Atrial fibrillation (HCC)    Hyperlipemia    Hypothyroidism    Meniere's disease    Peripheral vascular disease (HCC)    Thyroid  disease    Past Surgical History:  Procedure Laterality Date   ABDOMINAL HYSTERECTOMY     CARDIOVERSION N/A 03/21/2021   Procedure: CARDIOVERSION;  Surgeon: Francyne Headland, MD;  Location: [facility] ENDOSCOPY;  Service: Cardiovascular;  Laterality: N/A;   CARDIOVERSION N/A 07/05/2022   Procedure: CARDIOVERSION;  Surgeon: Lonni Slain, MD;  Location: [facility] ENDOSCOPY;  Service: Cardiovascular;  Laterality:  N/A;   ERCP Left 10/03/2014   Procedure: ENDOSCOPIC RETROGRADE CHOLANGIOPANCREATOGRAPHY (ERCP);  Surgeon: Norleen JAYSON Hint, MD;  Location: [facility] ENDOSCOPY;  Service: Gastroenterology;  Laterality: Left;   ERCP N/A 01/14/2015   Procedure: ENDOSCOPIC RETROGRADE CHOLANGIOPANCREATOGRAPHY (ERCP);  Surgeon: Oliva FORBES Boots, MD;  Location: [facility] ENDOSCOPY;  Service: Endoscopy;  Laterality: N/A;  need lithotripter/ordered/LH   SPHINCTEROTOMY  09/2014   SPYGLASS LITHOTRIPSY N/A 01/14/2015   Procedure: DEBHOJDD LITHOTRIPSY;  Surgeon: Oliva FORBES Boots, MD;  Location: [facility] ENDOSCOPY;  Service: Endoscopy;  Laterality: N/A;   Social History   Tobacco Use   Smoking status: Never   Smokeless tobacco: Never  Vaping Use   Vaping status: Never Used  Substance Use Topics   Alcohol use: No   Drug use: No   Family History  Problem Relation Age of Onset   Other Mother    Heart attack Mother    Other Father    Allergies  Allergen Reactions   Ursodiol  Other (See Comments)    Severe Dizziness   Rosuvastatin  Other (See Comments)    Leg pain    ROS Negative unless stated above    Objective:     BP 130/80   Pulse 64   Temp 97.7 F (36.5 C) (Oral)   Ht 5' 6 (1.676 m)   Wt 159 lb 6.4 oz (72.3 kg)   SpO2 99%   BMI 25.73 kg/m  BP Readings from Last 3 Encounters:  08/19/24 130/80  05/13/24 122/78  05/11/24 (!) 113/95   Wt  Readings from Last 3 Encounters:  08/19/24 159 lb 6.4 oz (72.3 kg)  05/13/24 156 lb 3.2 oz (70.9 kg)  05/06/24 160 lb 6.4 oz (72.8 kg)      Physical Exam Constitutional:      General: She is not in acute distress.    Appearance: Normal appearance.     Comments: HOH.  Hearing aids in place.  HENT:     Head: Normocephalic and atraumatic.     Nose: Nose normal.     Mouth/Throat:     Mouth: Mucous membranes are moist.  Cardiovascular:     Rate and Rhythm: Regular rhythm.     Heart sounds: Normal heart sounds. No murmur heard.    No gallop.  Pulmonary:     Effort: Pulmonary effort  is normal. No respiratory distress.     Breath sounds: Normal breath sounds. No wheezing, rhonchi or rales.  Skin:    General: Skin is warm and dry.     Findings: Ecchymosis present.         Comments: Areas of ecchymosis on bilateral forearms, non-blanching.  Older bruise on left lateral elbow yellow in color.  Neurological:     Mental Status: She is alert and oriented to person, place, and time.        05/06/2024    2:17 PM 11/15/2023    1:46 PM 09/25/2023   10:50 AM  Depression screen PHQ 2/9  Decreased Interest 0 0 0  Down, Depressed, Hopeless 1 0 0  PHQ - 2 Score 1 0 0  Altered sleeping 1 0 1  Tired, decreased energy 1 3 1   Change in appetite 0 0 0  Feeling bad or failure about yourself  0 0 0  Trouble concentrating 0 0 0  Moving slowly or fidgety/restless 0 0 0  Suicidal thoughts 0 0 0  PHQ-9 Score 3 3 2   Difficult doing work/chores  Not difficult at all Not difficult at all      05/06/2024    2:18 PM 11/15/2023    1:46 PM 09/25/2023   10:50 AM 08/22/2023    1:18 PM  GAD 7 : Generalized Anxiety Score  Nervous, Anxious, on Edge 0 0 0 0  Control/stop worrying 0 2 0 0  Worry too much - different things 0 0 0 0  Trouble relaxing 0 0 0 0  Restless 0 0 0 0  Easily annoyed or irritable 0 1 0 1  Afraid - awful might happen 0 0 0 0  Total GAD 7 Score 0 3 0 1  Anxiety Difficulty  Somewhat difficult Not difficult at all Not difficult at all     No results found for any visits on 08/19/24.    Assessment & Plan:   Ecchymosis of forearm   Bruising on b/l forearms like due to use of Eliquis .  Labs from 05/10/24 including INR 1.0 and aPTT 28, normal.  Supportive care.  Given handout.  No follow-ups on file.   Clotilda JONELLE Single, MD

## 2024-08-19 NOTE — Patient Instructions (Signed)
 Your bruising is likely from your blood thinner Eliquis .  Your recent labs in June showed normal liver function.

## 2024-09-08 ENCOUNTER — Other Ambulatory Visit: Payer: Self-pay | Admitting: Cardiology

## 2024-09-08 ENCOUNTER — Telehealth: Payer: Self-pay | Admitting: Cardiology

## 2024-09-08 DIAGNOSIS — I480 Paroxysmal atrial fibrillation: Secondary | ICD-10-CM

## 2024-09-08 MED ORDER — AMIODARONE HCL 200 MG PO TABS: 200.0000 mg | ORAL_TABLET | Freq: Every day | ORAL | 2 refills | Status: DC

## 2024-09-08 NOTE — Telephone Encounter (Signed)
*  STAT* If patient is at the pharmacy, call can be transferred to refill team.   1. Which medications need to be refilled? (please list name of each medication and dose if known)   amiodarone  (PACERONE ) 200 MG tablet     2. Would you like to learn more about the convenience, safety, & potential cost savings by using the [facility]? No    3. Are you open to using the [facility] (Type [facility]. ). No   4. Which pharmacy/location (including street and city if local pharmacy) is medication to be sent to? [id number] - [place], [place] - [address]    5. Do they need a 30 day or 90 day supply? 30 day  Pt has appt with Dr. Lonni on 12/10.

## 2024-09-08 NOTE — Telephone Encounter (Signed)
 Pt's medication was sent to pt's pharmacy as requested. Confirmation received.

## 2024-10-26 ENCOUNTER — Encounter: Payer: Self-pay | Admitting: Radiology

## 2024-10-27 ENCOUNTER — Ambulatory Visit: Payer: Self-pay

## 2024-10-27 ENCOUNTER — Encounter: Payer: Self-pay | Admitting: Family Medicine

## 2024-10-27 ENCOUNTER — Ambulatory Visit (INDEPENDENT_AMBULATORY_CARE_PROVIDER_SITE_OTHER): Admitting: Family Medicine

## 2024-10-27 VITALS — BP 124/70 | HR 65 | Temp 98.1°F | Wt 160.4 lb

## 2024-10-27 DIAGNOSIS — S60221A Contusion of right hand, initial encounter: Secondary | ICD-10-CM

## 2024-10-27 NOTE — Telephone Encounter (Signed)
 FYI Only or Action Required?: FYI only for provider: appointment scheduled on 10/27/2024 at 2 PM.  Patient was last seen in primary care on 08/19/2024 by Mercer Clotilda SAUNDERS, MD.  Called Nurse Triage reporting Hand Pain.  Symptoms began yesterday.  Interventions attempted: Rest, hydration, or home remedies.  Symptoms are: unchanged.  Triage Disposition: See HCP Within 4 Hours (Or PCP Triage)  Patient/caregiver understands and will follow disposition?: Yes  Copied from CRM [id number]. Topic: Clinical - Red Word Triage >> Oct 27, 2024  9:27 AM Victoria A wrote: [facility] that prompted transfer to Nurse Triage: Patient's husband called patient has red lump on back on her hand. Swollen with pain. Occurred over night Reason for Disposition  [1] Looks infected (e.g., spreading redness, pus) AND [2] large red area (> 2 inches or 5 cm)  Answer Assessment - Initial Assessment Questions Patient reports an area of swelling and pain to right hand that started overnight. Swelling started beneath patient's knuckles and goes to the beginning of the wrist. Husband states the swelling is about two inches high. Patient is on blood thinners-doesn't know if she hit her hand on something. Scheduled to see a provider in PCP office today at 2:00 PM  1. ONSET: When did the pain start?     Started over night 2. LOCATION: Where is the pain located?     Right hand  3. PAIN: How bad is the pain? (Scale 1-10; or mild, moderate, severe)     When she touches the area-soreness to the touch 4. WORK OR EXERCISE: Has there been any recent work or exercise that involved this part (i.e., hand or wrist) of the body?     No that the patient is aware of 5. CAUSE: What do you think is causing the pain?     Swelling to the area of the hand-about 2 inches long 6. AGGRAVATING FACTORS: What makes the pain worse? (e.g., using computer)     Touching the area 7. OTHER SYMPTOMS: Do you have any other symptoms? (e.g.,  fever, neck pain, numbness or tingling, rash, swelling)     Swelling  Protocols used: Hand Pain-A-AH

## 2024-10-27 NOTE — Progress Notes (Signed)
   Subjective:    Patient ID: Paige Levy, female    DOB: 1941/03/04, 83 y.o.   MRN: [id number]  HPI Here with her husband for swelling in the right hand that began last evening. No known hx of trauma. The began as a small lump on the back of her hand, and it has slowly gotten larger throughout today. It feels tight to her, but it not really painful. She has been taking Eliquis  for a few years for atrial fibrillation.    Review of Systems  Constitutional: Negative.   Respiratory: Negative.    Cardiovascular: Negative.   Musculoskeletal:  Positive for joint swelling.       Objective:   Physical Exam Constitutional:      General: She is not in acute distress.    Appearance: Normal appearance.  Cardiovascular:     Rate and Rhythm: Normal rate. Rhythm irregular.     Pulses: Normal pulses.     Heart sounds: Normal heart sounds.  Pulmonary:     Effort: Pulmonary effort is normal.     Breath sounds: Normal breath sounds.  Musculoskeletal:     Comments: The dorsum of the right hand is ecchymotic and fluctuant. This is mildly tender. There is no warmth. Her knuckles and wrist are not tender and have full ROM. She is able to make a fist   Neurological:     Mental Status: She is alert.           Assessment & Plan:  She has a hematoma on the dorsal right hand , likely from some mild recent trauma. I advised her to keep it elevated to heart level for the netx few days. She can apply ice packs periodically. She can take Tylenol  as needed for discomfort. She will follow up if the swelling gets worse.  Garnette Olmsted, MD

## 2024-10-27 NOTE — Telephone Encounter (Signed)
 Patient was seen 11/4 Dr. Johnny

## 2024-10-28 ENCOUNTER — Ambulatory Visit: Payer: Self-pay

## 2024-10-28 ENCOUNTER — Other Ambulatory Visit: Payer: Self-pay | Admitting: Family Medicine

## 2024-10-28 MED ORDER — CEPHALEXIN 500 MG PO CAPS: 500.0000 mg | ORAL_CAPSULE | Freq: Three times a day (TID) | ORAL | 0 refills | Status: AC

## 2024-10-28 NOTE — Progress Notes (Signed)
 Done

## 2024-10-28 NOTE — Telephone Encounter (Signed)
 It sounds like it is getting infected. I sent in a RX for Cephalexin  to take TID. Try to get 2 doses in this evening. Let us  know if it gets any worse.

## 2024-10-28 NOTE — Telephone Encounter (Signed)
 Spoke with pt spouse advised of Dr Johnny recommendation voiced understanding

## 2024-10-28 NOTE — Telephone Encounter (Signed)
Appt 11/14.

## 2024-10-28 NOTE — Telephone Encounter (Signed)
 FYI Only or Action Required?: Action required by provider: clinical question for provider.  Patient was last seen in primary care on 10/27/2024 by Johnny Garnette LABOR, MD.  Called Nurse Triage reporting Advice Only.   Triage Disposition: Call PCP Now  Patient/caregiver understands and will follow disposition?: Yes       Copied from CRM [id number]. Topic: Clinical - Red Word Triage >> Oct 28, 2024 10:31 AM Paige Levy wrote: [facility] that prompted transfer to Nurse Triage: Pt was seen yesterday for hemtoma on right hand. Pt husband, Anthony, said pt now has redness that has spread from her arm towards her elbow. Area is not painful but sore. Warm transfer to NT >> Oct 28, 2024  4:01 PM Suzen RAMAN wrote: Patient husband back on the line stating patient hand has gotten worse and he's not sure what to do. Reason for Disposition  [1] Caller requests to speak ONLY to PCP AND [2] URGENT question  Answer Assessment - Initial Assessment Questions This RN spoke with the pt's husband regarding symptoms. He states he called back to report that the patient's redness on her arm is spreading and is now over a foot long. He states he was told to notify PCP an he would make arrangements for the pt to be seen by another provider. Denies pain just some soreness. Requesting a call back from PCP.    1. REASON FOR CALL or QUESTION: What is your reason for calling today? or How can I best     Follow up call  2. CALLER: Document the source of call. (e.g., laboratory staff, caregiver or patient).     Pt's husband  Protocols used: PCP Call - No Triage-A-AH

## 2024-10-28 NOTE — Telephone Encounter (Signed)
 FYI Only or Action Required?: Action required by provider: update on patient condition.  Patient was last seen in primary care on 10/27/2024 by Paige Levy LABOR, MD.  Called Nurse Triage reporting Arm Pain.  Symptoms began 2 days ago.  Interventions attempted: Rest, hydration, or home remedies.  Symptoms are: unchanged.  Triage Disposition: See HCP Within 4 Hours (Or PCP Triage)  Patient/caregiver understands and will follow disposition?: No, wishes to speak with PCP  Copied from CRM [id number]. Topic: Clinical - Red Word Triage >> Oct 28, 2024 10:31 AM [name] wrote: [facility] that prompted transfer to Nurse Triage: Pt was seen yesterday for hemtoma on right hand. Pt husband, Anthony, said pt now has redness that has spread from her arm towards her elbow. Area is not painful but sore. Warm transfer to NT Reason for Disposition  [1] Red area or streak AND [2] large (> 2 inches or 5 cm)  Answer Assessment - Initial Assessment Questions Was seen yesterday for a large lump to right hand. Reports redness has moved up her arm. Patient was told to call if redness increased. Needing a follow up call from office.   1. ONSET: When did the pain start?     Initially started yesterday with a lump to the top of right hand. Redness is moving down her arm 2. LOCATION: Where is the pain located?     Right forearm 3. PAIN: How bad is the pain? (Scale 0-10; or none, mild, moderate, severe)     Mild to moderate 4. WORK OR EXERCISE: Has there been any recent work or exercise that involved this part of the body?     no 5. CAUSE: What do you think is causing the arm pain?     unsure 6. OTHER SYMPTOMS: Do you have any other symptoms? (e.g., neck pain, swelling, rash, fever, numbness, weakness)     Redness to arm  Protocols used: Arm Pain-A-AH

## 2024-10-29 ENCOUNTER — Ambulatory Visit: Payer: Self-pay

## 2024-10-29 NOTE — Telephone Encounter (Signed)
 FYI Only or Action Required?: FYI only for provider: appointment scheduled on 11/7.  Patient was last seen in primary care on 10/27/2024 by Johnny Garnette LABOR, MD.  Called Nurse Triage reporting Arm Pain.  Symptoms began 2-3 days ago.  Interventions attempted: Prescription medications: keflex .  Symptoms are: gradually worsening.  Triage Disposition: See HCP Within 4 Hours (Or PCP Triage)  Patient/caregiver understands and will follow disposition?: Yes with modifications, patient started Kelfex yesterday, appointment scheduled for tomorrow for reevaluation.      Copied from CRM [id number]. Topic: Clinical - Red Word Triage >> Oct 29, 2024  1:16 PM Victoria A wrote: [facility] that prompted transfer to Nurse Triage: Patients spouse called area is spreading and very tender. Area is worse       Reason for Disposition  [1] Red area or streak AND [2] large (> 2 inches or 5 cm)    Patient currently on Keflex  for <24 hours, appointment scheduled for tomorrow  Answer Assessment - Initial Assessment Questions Patient started Kelfex yesterday, appointment scheduled for tomorrow for reevaluation.     1. ONSET: When did the pain start? 2 days ago 2. LOCATION: Where is the pain located? Right hand  3. PAIN: How bad is the pain? (Scale 1-10; or mild, moderate, severe) When she touches the area-soreness to the touch 4. WORK OR EXERCISE: Has there been any recent work or exercise that involved this part (i.e., hand or wrist) of the body? No that the patient is aware of 5. CAUSE: What do you think is causing the pain? Swelling to the area of the hand-about 2 inches long 6. AGGRAVATING FACTORS: What makes the pain worse? (e.g., using computer) Touching the area 7. OTHER SYMPTOMS: Do you have any other symptoms? (e.g., fever, neck pain, numbness or tingling, rash, swelling) Swelling, redness spreading around arm now  Protocols used: Hand Pain-A-AH  Protocols used: Arm  Pain-A-AH

## 2024-10-30 ENCOUNTER — Ambulatory Visit (INDEPENDENT_AMBULATORY_CARE_PROVIDER_SITE_OTHER): Admitting: Family Medicine

## 2024-10-30 ENCOUNTER — Encounter: Payer: Self-pay | Admitting: Family Medicine

## 2024-10-30 VITALS — BP 146/72 | HR 68 | Temp 97.9°F | Ht 66.0 in | Wt 161.2 lb

## 2024-10-30 DIAGNOSIS — Z7901 Long term (current) use of anticoagulants: Secondary | ICD-10-CM

## 2024-10-30 DIAGNOSIS — S60221A Contusion of right hand, initial encounter: Secondary | ICD-10-CM

## 2024-10-30 DIAGNOSIS — I480 Paroxysmal atrial fibrillation: Secondary | ICD-10-CM | POA: Diagnosis not present

## 2024-10-30 NOTE — Progress Notes (Signed)
 Established Patient Office Visit   Subjective  Patient ID: Paige Levy, female    DOB: 08/15/1941  Age: 83 y.o. MRN: [id number]  Chief Complaint  Patient presents with   Acute Visit    Right hand and lower arm swelling and bruising follow-up from 11/4, the hand is getting worse,   Pt accompanied by her husband.  Pt is a pleasant 83 yo female with pmh sig for Afib on eliquis , HLD, HTN, osteopenia, Hearing loss, OA who was seen for f/u on hematoma.  Pt seen by Dr. Johnny on 10/27/24 after waking with R hand edema and discoloration.  A tender lump noted on dorsum of R hand.  Denies trauma or injury.  Unable to remove her ring.Ring is currently loose, but wasn't initially. The swelling was more pronounced a day or two ago but has since decreased slightly.  The discoloration has spread up her arm. She has been applying ice and keeping the hand elevated.   Ablle to move her fingers and make a fist.  Some discomfort if hand bumps against something like while getting dressed.  She is on Eliquis  for atrial fibrillation, which she has been taking regularly. She acknowledges that the medication makes her bruise easily and that she has always been prone to bruising. She has not experienced any fever or other systemic symptoms.    Patient Active Problem List   Diagnosis Date Noted   Osteopenia 10/30/2023   Neoplasm of uncertain behavior of skin 08/28/2022   Secondary hypercoagulable state 03/29/2021   Current use of long term anticoagulation 03/29/2021   Essential hypertension 03/29/2021   Pure hypercholesterolemia 03/29/2021   Atrial fibrillation (HCC) 02/08/2021   Pulsatile tinnitus of right ear 01/01/2020   Submandibular lymphadenopathy 11/18/2019   Chronic rhinitis 04/27/2016   Hearing loss 04/27/2016   Active cochlear Meniere's disease, bilateral 01/17/2015   Meniere's disease 10/01/2014   Dyslipidemia 10/01/2014   Hypothyroidism 11/09/2013   Osteoarthritis 11/09/2013   Hyperlipidemia  11/09/2013   Past Medical History:  Diagnosis Date   Arthritis    Atrial fibrillation (HCC)    Hyperlipemia    Hypothyroidism    Meniere's disease    Peripheral vascular disease    Thyroid  disease    Past Surgical History:  Procedure Laterality Date   ABDOMINAL HYSTERECTOMY     CARDIOVERSION N/A 03/21/2021   Procedure: CARDIOVERSION;  Surgeon: Francyne Headland, MD;  Location: [facility] ENDOSCOPY;  Service: Cardiovascular;  Laterality: N/A;   CARDIOVERSION N/A 07/05/2022   Procedure: CARDIOVERSION;  Surgeon: Lonni Slain, MD;  Location: [facility] ENDOSCOPY;  Service: Cardiovascular;  Laterality: N/A;   ERCP Left 10/03/2014   Procedure: ENDOSCOPIC RETROGRADE CHOLANGIOPANCREATOGRAPHY (ERCP);  Surgeon: Norleen JAYSON Hint, MD;  Location: [facility] ENDOSCOPY;  Service: Gastroenterology;  Laterality: Left;   ERCP N/A 01/14/2015   Procedure: ENDOSCOPIC RETROGRADE CHOLANGIOPANCREATOGRAPHY (ERCP);  Surgeon: Oliva FORBES Boots, MD;  Location: [facility] ENDOSCOPY;  Service: Endoscopy;  Laterality: N/A;  need lithotripter/ordered/LH   SPHINCTEROTOMY  09/2014   SPYGLASS LITHOTRIPSY N/A 01/14/2015   Procedure: DEBHOJDD LITHOTRIPSY;  Surgeon: Oliva FORBES Boots, MD;  Location: [facility] ENDOSCOPY;  Service: Endoscopy;  Laterality: N/A;   Social History   Tobacco Use   Smoking status: Never   Smokeless tobacco: Never  Vaping Use   Vaping status: Never Used  Substance Use Topics   Alcohol use: No   Drug use: No   Family History  Problem Relation Age of Onset   Other Mother    Heart attack Mother  Other Father    Allergies  Allergen Reactions   Ursodiol  Other (See Comments)    Severe Dizziness   Rosuvastatin  Other (See Comments)    Leg pain    ROS Negative unless stated above    Objective:     BP (!) 146/72 (BP Location: Left Arm, Patient Position: Sitting, Cuff Size: Large)   Pulse 68   Temp 97.9 F (36.6 C) (Oral)   Ht 5' 6 (1.676 m)   Wt 161 lb 3.2 oz (73.1 kg)   SpO2 96%   BMI 26.02 kg/m  BP Readings from  Last 3 Encounters:  10/30/24 (!) 146/72  10/27/24 124/70  08/19/24 130/80   Wt Readings from Last 3 Encounters:  10/30/24 161 lb 3.2 oz (73.1 kg)  10/27/24 160 lb 6.4 oz (72.8 kg)  08/19/24 159 lb 6.4 oz (72.3 kg)      Physical Exam Constitutional:      General: She is not in acute distress.    Appearance: Normal appearance.  HENT:     Head: Normocephalic and atraumatic.     Nose: Nose normal.     Mouth/Throat:     Mouth: Mucous membranes are moist.  Cardiovascular:     Rate and Rhythm: Normal rate and regular rhythm.     Heart sounds: Normal heart sounds. No murmur heard.    No gallop.  Pulmonary:     Effort: Pulmonary effort is normal. No respiratory distress.     Breath sounds: Normal breath sounds. No wheezing, rhonchi or rales.  Skin:    General: Skin is warm and dry.     Findings: Bruising and ecchymosis present. No abrasion, signs of injury, laceration, lesion or wound.     Comments: Dorsum of R hand with 4 cm fluctuant hematoma and surrounding ecchymosis.  Skin intact, no lacerations  Neurological:     Mental Status: She is alert and oriented to person, place, and time.       Above picture on pt's husband's phone from a few days ago.         05/06/2024    2:17 PM 11/15/2023    1:46 PM 09/25/2023   10:50 AM  Depression screen PHQ 2/9  Decreased Interest 0 0 0  Down, Depressed, Hopeless 1 0 0  PHQ - 2 Score 1 0 0  Altered sleeping 1 0 1  Tired, decreased energy 1 3 1   Change in appetite 0 0 0  Feeling bad or failure about yourself  0 0 0  Trouble concentrating 0 0 0  Moving slowly or fidgety/restless 0 0 0  Suicidal thoughts 0 0 0  PHQ-9 Score 3  3  2    Difficult doing work/chores  Not difficult at all Not difficult at all     Data saved with a previous flowsheet row definition      05/06/2024    2:18 PM 11/15/2023    1:46 PM 09/25/2023   10:50 AM 08/22/2023    1:18 PM  GAD 7 : Generalized Anxiety Score  Nervous, Anxious, on Edge 0 0 0 0   Control/stop worrying 0 2 0 0  Worry too much - different things 0 0 0 0  Trouble relaxing 0 0 0 0  Restless 0 0 0 0  Easily annoyed or irritable 0 1 0 1  Afraid - awful might happen 0 0 0 0  Total GAD 7 Score 0 3 0 1  Anxiety Difficulty  Somewhat difficult Not difficult at all Not  difficult at all     No results found for any visits on 10/30/24.    Assessment & Plan:   Hematoma of right hand  Chronic anticoagulation  Paroxysmal A-fib (HCC)  Improvement in hematoma of R hand likely from barely bumping into something or with rolling over at night d/t Eliquis  use for afib.  Edema decreasing when pictures compared.  Continue supportive care including ice, heat, compression, elevation, and time.  Ring in place and loose on finger.  Unable to remove due to bony enlargement of PIP jt.  Advised on need to cut ring off if edema increases.  Advised ecchymosis will likely appear to continue spreading due to gravity/ position.  Given precautions.  Return if symptoms worsen or fail to improve.   Clotilda JONELLE Single, MD

## 2024-11-06 ENCOUNTER — Ambulatory Visit: Admitting: Family Medicine

## 2024-11-09 ENCOUNTER — Other Ambulatory Visit: Payer: Self-pay | Admitting: Cardiology

## 2024-11-09 DIAGNOSIS — I4891 Unspecified atrial fibrillation: Secondary | ICD-10-CM

## 2024-11-09 NOTE — Telephone Encounter (Signed)
 Prescription refill request for Eliquis  received. Indication:afib Last office visit:upcoming Scr:1.04  6/25 Age: 83 Weight:73.1  kg  Prescription refilled

## 2024-11-13 ENCOUNTER — Ambulatory Visit: Payer: Self-pay

## 2024-11-13 NOTE — Telephone Encounter (Signed)
 FYI Only or Action Required?: FYI only for provider: appointment scheduled on 11/16/24.  Patient was last seen in primary care on 10/30/2024 by Mercer Clotilda SAUNDERS, MD.  Called Nurse Triage reporting Arm Swelling and Bleeding/Bruising.  Symptoms began several weeks ago.  Interventions attempted: Other: OV on 10/30/24.  Symptoms are: stable.  Triage Disposition: See PCP Within 2 Weeks (overriding See PCP When Office is Open (Within 3 Days))  Patient/caregiver understands and will follow disposition?: Yes                            Copied from CRM [id number]. Topic: Clinical - Red Word Triage >> Nov 13, 2024  9:33 AM Larissa RAMAN wrote: [facility] that prompted transfer to Nurse Triage: Rt hand - swelling  Reason for Disposition  [1] Small area of localized swelling AND [2] not better after 3 days  Answer Assessment - Initial Assessment Questions 1. ONSET: When did the swelling start? (e.g., minutes, hours, days)     3 weeks, seen for OV on 10/30/24 regarding hematoma  2. LOCATION: What part of the hand is swollen?  Are both hands swollen or just one hand?     Top of right hand 3. TYPE : What does it look like? (e.g., ball, lump; localized; hand swelling)     Mild, localized, 1 1/2 inch in diameter 4. SWELLING SEVERITY: If more than a lump or localized, ask: How bad is the hand swelling? (e.g., mild, moderate, severe; describe)     Mild, localized, describes swelling as a small dome 5. REDNESS: Is there redness or signs of infection?     Husband reports that redness has significantly decreased 6. PAIN: Is the swelling painful to touch? If Yes, ask: How painful is it?   (Scale 1-10; mild, moderate or severe)     Tenderness 7. FEVER: Do you have a fever? If Yes, ask: What is it, how was it measured, and when did it start?      Denies  8. CAUSE: What do you think is causing the hand swelling? (e.g., heat, insect bite, pregnancy, recent injury)      Hematoma, husband states symptoms of hematoma have overall improved, but expresses concern about localized area of swelling that is still present  9. MEDICAL HISTORY: Do you have a history of heart failure, kidney disease, liver failure, or cancer?     N/A 10. RECURRENT SYMPTOM: Have you had hand swelling before? If Yes, ask: When was the last time? What happened that time?     N/A 11. OTHER SYMPTOMS: Do you have any other symptoms? (e.g., blurred vision, difficulty breathing, headache)     Reports purple, discoloration      Denies chest pain, denies difficulty breathing 12. PREGNANCY: Is there any chance you are pregnant? When was your last menstrual period?     N/A    This RN scheduled first available appointment with PCP. Patient scheduled for Monday morning. Patient has been advised to call back or go to ED for worsening symptoms over the weekend.  Protocols used: Hand Swelling-A-AH

## 2024-11-16 ENCOUNTER — Encounter: Payer: Self-pay | Admitting: Family Medicine

## 2024-11-16 ENCOUNTER — Ambulatory Visit: Admitting: Family Medicine

## 2024-11-16 VITALS — BP 156/84 | HR 70 | Temp 97.7°F | Ht 66.0 in | Wt 161.0 lb

## 2024-11-16 DIAGNOSIS — I10 Essential (primary) hypertension: Secondary | ICD-10-CM

## 2024-11-16 DIAGNOSIS — S60221A Contusion of right hand, initial encounter: Secondary | ICD-10-CM | POA: Diagnosis not present

## 2024-11-16 NOTE — Telephone Encounter (Signed)
 Patient has appt 11/24

## 2024-11-16 NOTE — Progress Notes (Signed)
 Established Patient Office Visit   Subjective  Patient ID: Paige Levy, female    DOB: March 27, 1941  Age: 83 y.o. MRN: [id number]  Chief Complaint  Patient presents with   Acute Visit    Patient came in today for a follow-up of right hand hematoma, doing better swelling has come down some, patient states the hand is still tender to the touch   Pt accompanied by her husband.  Pt is a pleasant 83 yo female seen for f/u.  Pt seen 11/3 for a large hematoma of R hand and on 11/7 for same.  No injury noted.  On Elliquis for Afib.  Ecchymosis of R hand and forearm were improving.   Ecchymosis of R arm resolved.  Pt concerned that bump on hand has not resolved.  Have been applying cool compresses. Denies pain or tightness in hand.  A little sore if hits dorsum of hand against something.    Patient Active Problem List   Diagnosis Date Noted   Osteopenia 10/30/2023   Neoplasm of uncertain behavior of skin 08/28/2022   Secondary hypercoagulable state 03/29/2021   Current use of long term anticoagulation 03/29/2021   Essential hypertension 03/29/2021   Pure hypercholesterolemia 03/29/2021   Atrial fibrillation (HCC) 02/08/2021   Pulsatile tinnitus of right ear 01/01/2020   Submandibular lymphadenopathy 11/18/2019   Chronic rhinitis 04/27/2016   Hearing loss 04/27/2016   Active cochlear Meniere's disease, bilateral 01/17/2015   Meniere's disease 10/01/2014   Dyslipidemia 10/01/2014   Hypothyroidism 11/09/2013   Osteoarthritis 11/09/2013   Hyperlipidemia 11/09/2013   Past Medical History:  Diagnosis Date   Arthritis    Atrial fibrillation (HCC)    Hyperlipemia    Hypothyroidism    Meniere's disease    Peripheral vascular disease    Thyroid  disease    Past Surgical History:  Procedure Laterality Date   ABDOMINAL HYSTERECTOMY     CARDIOVERSION N/A 03/21/2021   Procedure: CARDIOVERSION;  Surgeon: Francyne Headland, MD;  Location: [facility] ENDOSCOPY;  Service: Cardiovascular;  Laterality:  N/A;   CARDIOVERSION N/A 07/05/2022   Procedure: CARDIOVERSION;  Surgeon: Lonni Slain, MD;  Location: [facility] ENDOSCOPY;  Service: Cardiovascular;  Laterality: N/A;   ERCP Left 10/03/2014   Procedure: ENDOSCOPIC RETROGRADE CHOLANGIOPANCREATOGRAPHY (ERCP);  Surgeon: Norleen JAYSON Hint, MD;  Location: [facility] ENDOSCOPY;  Service: Gastroenterology;  Laterality: Left;   ERCP N/A 01/14/2015   Procedure: ENDOSCOPIC RETROGRADE CHOLANGIOPANCREATOGRAPHY (ERCP);  Surgeon: Oliva FORBES Boots, MD;  Location: [facility] ENDOSCOPY;  Service: Endoscopy;  Laterality: N/A;  need lithotripter/ordered/LH   SPHINCTEROTOMY  09/2014   SPYGLASS LITHOTRIPSY N/A 01/14/2015   Procedure: DEBHOJDD LITHOTRIPSY;  Surgeon: Oliva FORBES Boots, MD;  Location: [facility] ENDOSCOPY;  Service: Endoscopy;  Laterality: N/A;   Social History   Tobacco Use   Smoking status: Never   Smokeless tobacco: Never  Vaping Use   Vaping status: Never Used  Substance Use Topics   Alcohol use: No   Drug use: No   Family History  Problem Relation Age of Onset   Other Mother    Heart attack Mother    Other Father    Allergies  Allergen Reactions   Ursodiol  Other (See Comments)    Severe Dizziness   Rosuvastatin  Other (See Comments)    Leg pain    ROS Negative unless stated above    Objective:     BP (!) 156/84 (BP Location: Left Arm, Patient Position: Sitting, Cuff Size: Normal)   Pulse 70   Temp 97.7 F (36.5 C) (  Oral)   Ht 5' 6 (1.676 m)   Wt 161 lb (73 kg)   SpO2 96%   BMI 25.99 kg/m  BP Readings from Last 3 Encounters:  11/16/24 (!) 156/84  10/30/24 (!) 146/72  10/27/24 124/70   Wt Readings from Last 3 Encounters:  11/16/24 161 lb (73 kg)  10/30/24 161 lb 3.2 oz (73.1 kg)  10/27/24 160 lb 6.4 oz (72.8 kg)      Physical Exam HENT:     Head: Normocephalic and atraumatic.     Right Ear: Decreased hearing noted.     Left Ear: Decreased hearing noted.     Ears:     Comments: HOH.  B/l Hearing aids in place.    Nose: Nose normal.      Mouth/Throat:     Mouth: Mucous membranes are moist.  Cardiovascular:     Rate and Rhythm: Normal rate.     Comments: Good cap refill of fingertips R hand. Pulmonary:     Effort: Pulmonary effort is normal.  Skin:    General: Skin is warm and dry.     Findings: Bruising and ecchymosis present.     Comments: A large hematoma of dorsum of R hand without increased warmth or surrounding erythema.    Neurological:     Mental Status: She is alert and oriented to person, place, and time.           05/06/2024    2:17 PM 11/15/2023    1:46 PM 09/25/2023   10:50 AM  Depression screen PHQ 2/9  Decreased Interest 0 0 0  Down, Depressed, Hopeless 1 0 0  PHQ - 2 Score 1 0 0  Altered sleeping 1 0 1  Tired, decreased energy 1 3 1   Change in appetite 0 0 0  Feeling bad or failure about yourself  0 0 0  Trouble concentrating 0 0 0  Moving slowly or fidgety/restless 0 0 0  Suicidal thoughts 0 0 0  PHQ-9 Score 3  3  2    Difficult doing work/chores  Not difficult at all Not difficult at all     Data saved with a previous flowsheet row definition      05/06/2024    2:18 PM 11/15/2023    1:46 PM 09/25/2023   10:50 AM 08/22/2023    1:18 PM  GAD 7 : Generalized Anxiety Score  Nervous, Anxious, on Edge 0 0 0 0  Control/stop worrying 0 2 0 0  Worry too much - different things 0 0 0 0  Trouble relaxing 0 0 0 0  Restless 0 0 0 0  Easily annoyed or irritable 0 1 0 1  Afraid - awful might happen 0 0 0 0  Total GAD 7 Score 0 3 0 1  Anxiety Difficulty  Somewhat difficult Not difficult at all Not difficult at all     No results found for any visits on 11/16/24.    Assessment & Plan:   Hematoma of right hand  Essential hypertension  Hematoma of dorsum R hand slowly improving.  Surrounding ecchymosis resolved.  Reviewed prior pictures taken at last OFV with pt to show improvement.  Advised will likely take months for resolution of hematoma.  Supportive care with warm compresses and time.   Offered referral to hand surgery for evaluation for possible evacuation of hematoma, though not likely indicated given current Eliquis  use and normal hand function.  BP elevated.  Recheck.  Ensure taking meds consistently.  Lifestyle modifications.  Keep upcoming Cardiology appt.  Return if symptoms worsen or fail to improve.   Clotilda JONELLE Single, MD

## 2024-12-02 ENCOUNTER — Ambulatory Visit (HOSPITAL_BASED_OUTPATIENT_CLINIC_OR_DEPARTMENT_OTHER): Admitting: Cardiology

## 2024-12-05 ENCOUNTER — Other Ambulatory Visit: Payer: Self-pay | Admitting: Cardiology

## 2024-12-05 DIAGNOSIS — I480 Paroxysmal atrial fibrillation: Secondary | ICD-10-CM

## 2025-01-01 ENCOUNTER — Other Ambulatory Visit: Payer: Self-pay | Admitting: Cardiology

## 2025-01-01 DIAGNOSIS — I480 Paroxysmal atrial fibrillation: Secondary | ICD-10-CM

## 2025-01-29 ENCOUNTER — Ambulatory Visit (HOSPITAL_BASED_OUTPATIENT_CLINIC_OR_DEPARTMENT_OTHER): Admitting: Cardiology

## 2025-01-29 ENCOUNTER — Encounter (HOSPITAL_BASED_OUTPATIENT_CLINIC_OR_DEPARTMENT_OTHER): Payer: Self-pay | Admitting: Cardiology

## 2025-01-29 VITALS — BP 136/66 | HR 67 | Wt 157.0 lb

## 2025-01-29 DIAGNOSIS — Z5181 Encounter for therapeutic drug level monitoring: Secondary | ICD-10-CM

## 2025-01-29 DIAGNOSIS — Z7901 Long term (current) use of anticoagulants: Secondary | ICD-10-CM

## 2025-01-29 DIAGNOSIS — D6869 Other thrombophilia: Secondary | ICD-10-CM

## 2025-01-29 DIAGNOSIS — I480 Paroxysmal atrial fibrillation: Secondary | ICD-10-CM

## 2025-01-29 DIAGNOSIS — I10 Essential (primary) hypertension: Secondary | ICD-10-CM

## 2025-01-29 MED ORDER — APIXABAN 5 MG PO TABS: 5.0000 mg | ORAL_TABLET | Freq: Two times a day (BID) | ORAL | 3 refills | Status: AC

## 2025-01-29 MED ORDER — AMIODARONE HCL 200 MG PO TABS: 200.0000 mg | ORAL_TABLET | Freq: Every day | ORAL | 3 refills | Status: AC

## 2025-01-29 NOTE — Progress Notes (Unsigned)
" °  Cardiology Office Note:  .   Date:  01/29/2025  ID:  Paige Levy, DOB 1941-10-09, MRN [id number] PCP: Mercer Clotilda SAUNDERS, MD  [facility] Providers Cardiologist:  Shelda Bruckner, MD {  History of Present Illness: .   Paige Levy is a 84 y.o. female with a hx of paroxysmal atrial fibrillation, hypothyroidism, hyperlipidemia, hypertension who is seen for follow up today. I initially met her 02/27/21 as a new consult for the evaluation and management of atrial fibrillation.   Today: She has not been seen since 05/2023 (had been planned for 6 mos follow up). Overall doing very wall. Walks regularly, no issues. Does bruise easily, had recent hematoma, now resolving. Has felt no afib recently. Has not required any PRN dosing for fast heart rates. Reviewed medication list, discussed monitoring for afib. No falls. No melena, hematochezia, or hematuria.  ROS: Denies chest pain, shortness of breath at rest or with normal exertion. No PND, orthopnea, LE edema or unexpected weight gain. No syncope or palpitations. ROS otherwise negative except as noted.   Studies Reviewed: SABRA    EKG:  EKG Interpretation Date/Time:  Friday January 29 2025 16:09:00 EST Ventricular Rate:  67 PR Interval:  206 QRS Duration:  82 QT Interval:  450 QTC Calculation: 475 R Axis:   43  Text Interpretation: Normal sinus rhythm Normal ECG When compared with ECG of 21-Jun-2023 11:01, No significant change was found Confirmed by Bruckner Shelda [phone]) on 01/29/2025 4:26:02 PM    Physical Exam:   VS:  BP 136/66 (BP Location: Right Arm, Patient Position: Sitting, Cuff Size: Normal)   Pulse 67   Wt 157 lb (71.2 kg)   SpO2 95%   BMI 25.34 kg/m    Wt Readings from Last 3 Encounters:  01/29/25 157 lb (71.2 kg)  11/16/24 161 lb (73 kg)  10/30/24 161 lb 3.2 oz (73.1 kg)    GEN: Well nourished, well developed in no acute distress. Hard of hearing. HEENT: Normal, moist mucous membranes NECK: No  JVD CARDIAC: regular rhythm, normal S1 and S2, no rubs or gallops. 1/6 systolic murmur. VASCULAR: Radial and DP pulses 2+ bilaterally. No carotid bruits RESPIRATORY:  Clear to auscultation without rales, wheezing or rhonchi  ABDOMEN: Soft, non-tender, non-distended MUSCULOSKELETAL:  Ambulates independently SKIN: Warm and dry, no edema. Focal R hand  NEUROLOGIC:  Alert and oriented x 3. No focal neuro deficits noted. PSYCHIATRIC:  Normal affect    ASSESSMENT AND PLAN: .   atrial fibrillation, paroxysmal: -CHA2DS2/VAS Stroke Risk Points=3 -continue apixaban  5 mg BID (only meets age criteria for reduction, not weight/Cr) -echo unremarkable -holding sinus with amiodarone . Discussed need for routine labs, will check CMET and TSH. Breathing is stable, no concerns on exam -metoprolol  stopped previously for HR 49   Hypertension:  -at goal today -has history of hypokalemia. May need to change from triamterene -HCTZ if K is difficult to manage. Checking labs today   Hypercholesterolemia: -developed muscle aches on rosuvastatin , stopped with stopping statin. Given age, we discussed, will not restart  Dispo: 6 mos, with monitoring of amiodarone  labs if not done in interim  Signed, Shelda Bruckner, MD   Shelda Bruckner, MD, PhD, [facility]  [facility]  [place]  [facility] at [facility] at [facility] [address] [place], [place] [address] [phone]   "

## 2025-01-29 NOTE — Patient Instructions (Addendum)
 Please get labs drawn in the lab on the third floor sometime next week. We we see you back in 6 mos.  Medication Instructions:  No changes *If you need a refill on your cardiac medications before your next appointment, please call your pharmacy*  Lab Work: Return for blood work next week - CMET, TSH  Testing/Procedures: none  Follow-Up: At [facility], you and your health needs are our priority.  As part of our continuing mission to provide you with exceptional heart care, our providers are all part of one team.  This team includes your primary Cardiologist (physician) and Advanced Practice Providers or APPs (Physician Assistants and Nurse Practitioners) who all work together to provide you with the care you need, when you need it.  Your next appointment:   6 month(s)  Provider:   Shelda Bruckner, MD, Rosaline Bane, NP, or Reche Finder, NP

## 2025-02-01 ENCOUNTER — Ambulatory Visit (HOSPITAL_BASED_OUTPATIENT_CLINIC_OR_DEPARTMENT_OTHER): Admitting: Cardiology
